# Patient Record
Sex: Female | Born: 1945 | Race: White | Hispanic: No | Marital: Married | Smoking: Never smoker
Health system: Southern US, Community
[De-identification: ages and names within clinical notes are randomized; demographics above are authoritative.]

## PROBLEM LIST (undated history)

## (undated) DIAGNOSIS — D649 Anemia, unspecified: Secondary | ICD-10-CM

## (undated) DIAGNOSIS — Z9889 Other specified postprocedural states: Secondary | ICD-10-CM

## (undated) DIAGNOSIS — E785 Hyperlipidemia, unspecified: Secondary | ICD-10-CM

## (undated) DIAGNOSIS — G4730 Sleep apnea, unspecified: Secondary | ICD-10-CM

## (undated) DIAGNOSIS — M1990 Unspecified osteoarthritis, unspecified site: Secondary | ICD-10-CM

## (undated) DIAGNOSIS — I10 Essential (primary) hypertension: Secondary | ICD-10-CM

## (undated) DIAGNOSIS — R42 Dizziness and giddiness: Secondary | ICD-10-CM

## (undated) DIAGNOSIS — R112 Nausea with vomiting, unspecified: Secondary | ICD-10-CM

## (undated) DIAGNOSIS — R519 Headache, unspecified: Secondary | ICD-10-CM

## (undated) DIAGNOSIS — J189 Pneumonia, unspecified organism: Secondary | ICD-10-CM

## (undated) DIAGNOSIS — E119 Type 2 diabetes mellitus without complications: Secondary | ICD-10-CM

## (undated) DIAGNOSIS — R0600 Dyspnea, unspecified: Secondary | ICD-10-CM

## (undated) DIAGNOSIS — R51 Headache: Secondary | ICD-10-CM

## (undated) HISTORY — PX: ESOPHAGOGASTRODUODENOSCOPY: SHX1529

## (undated) HISTORY — DX: Hyperlipidemia, unspecified: E78.5

## (undated) HISTORY — DX: Essential (primary) hypertension: I10

## (undated) HISTORY — PX: APPENDECTOMY: SHX54

## (undated) HISTORY — PX: GIVENS CAPSULE STUDY: SHX5432

## (undated) HISTORY — PX: COLONOSCOPY: SHX174

## (undated) HISTORY — DX: Dizziness and giddiness: R42

## (undated) HISTORY — PX: HEMORROIDECTOMY: SUR656

## (undated) HISTORY — PX: UMBILICAL HERNIA REPAIR: SHX196

## (undated) HISTORY — DX: Type 2 diabetes mellitus without complications: E11.9

## (undated) HISTORY — PX: ABDOMINAL HYSTERECTOMY: SHX81

---

## 2006-06-22 HISTORY — PX: CARPAL TUNNEL RELEASE: SHX101

## 2009-12-04 ENCOUNTER — Ambulatory Visit (HOSPITAL_COMMUNITY): Admission: RE | Admit: 2009-12-04 | Discharge: 2009-12-05 | Payer: Self-pay | Admitting: Neurosurgery

## 2010-09-07 LAB — GLUCOSE, CAPILLARY
Glucose-Capillary: 136 mg/dL — ABNORMAL HIGH (ref 70–99)
Glucose-Capillary: 142 mg/dL — ABNORMAL HIGH (ref 70–99)
Glucose-Capillary: 160 mg/dL — ABNORMAL HIGH (ref 70–99)
Glucose-Capillary: 203 mg/dL — ABNORMAL HIGH (ref 70–99)
Glucose-Capillary: 230 mg/dL — ABNORMAL HIGH (ref 70–99)

## 2010-09-07 LAB — POCT I-STAT 4, (NA,K, GLUC, HGB,HCT)
Glucose, Bld: 152 mg/dL — ABNORMAL HIGH (ref 70–99)
HCT: 32 % — ABNORMAL LOW (ref 36.0–46.0)
Hemoglobin: 10.9 g/dL — ABNORMAL LOW (ref 12.0–15.0)
Potassium: 2.9 mEq/L — ABNORMAL LOW (ref 3.5–5.1)
Sodium: 139 mEq/L (ref 135–145)

## 2010-09-08 LAB — BASIC METABOLIC PANEL
BUN: 11 mg/dL (ref 6–23)
CO2: 27 mEq/L (ref 19–32)
Calcium: 9.3 mg/dL (ref 8.4–10.5)
Chloride: 101 mEq/L (ref 96–112)
Creatinine, Ser: 1.02 mg/dL (ref 0.4–1.2)
GFR calc Af Amer: >60 mL/min (ref >60)
GFR calc non Af Amer: 55 mL/min — ABNORMAL LOW (ref >60)
Glucose, Bld: 227 mg/dL — ABNORMAL HIGH (ref 70–99)
Potassium: 3.9 mEq/L (ref 3.5–5.1)
Sodium: 137 mEq/L (ref 135–145)

## 2010-09-08 LAB — SURGICAL PCR SCREEN
MRSA, PCR: NEGATIVE
Staphylococcus aureus: NEGATIVE

## 2010-09-08 LAB — CBC
HCT: 39.3 % (ref 36.0–46.0)
Hemoglobin: 13.5 g/dL (ref 12.0–15.0)
MCHC: 34.3 g/dL (ref 30.0–36.0)
MCV: 90.1 fL (ref 78.0–100.0)
Platelets: 236 10*3/uL (ref 150–400)
RBC: 4.37 MIL/uL (ref 3.87–5.11)
RDW: 13.9 % (ref 11.5–15.5)
WBC: 11.9 10*3/uL — ABNORMAL HIGH (ref 4.0–10.5)

## 2010-09-08 LAB — TYPE AND SCREEN
ABO/RH(D): O POS
Antibody Screen: NEGATIVE

## 2010-09-08 LAB — ABO/RH: ABO/RH(D): O POS

## 2011-05-24 IMAGING — CR DG LUMBAR SPINE 2-3V
1 series · 1 of 1 positions shown · non-contrast
Comparison: None.

CLINICAL DATA: L4-5 decompression

LUMBAR SPINE - 2-3 VIEW

[view not recorded]
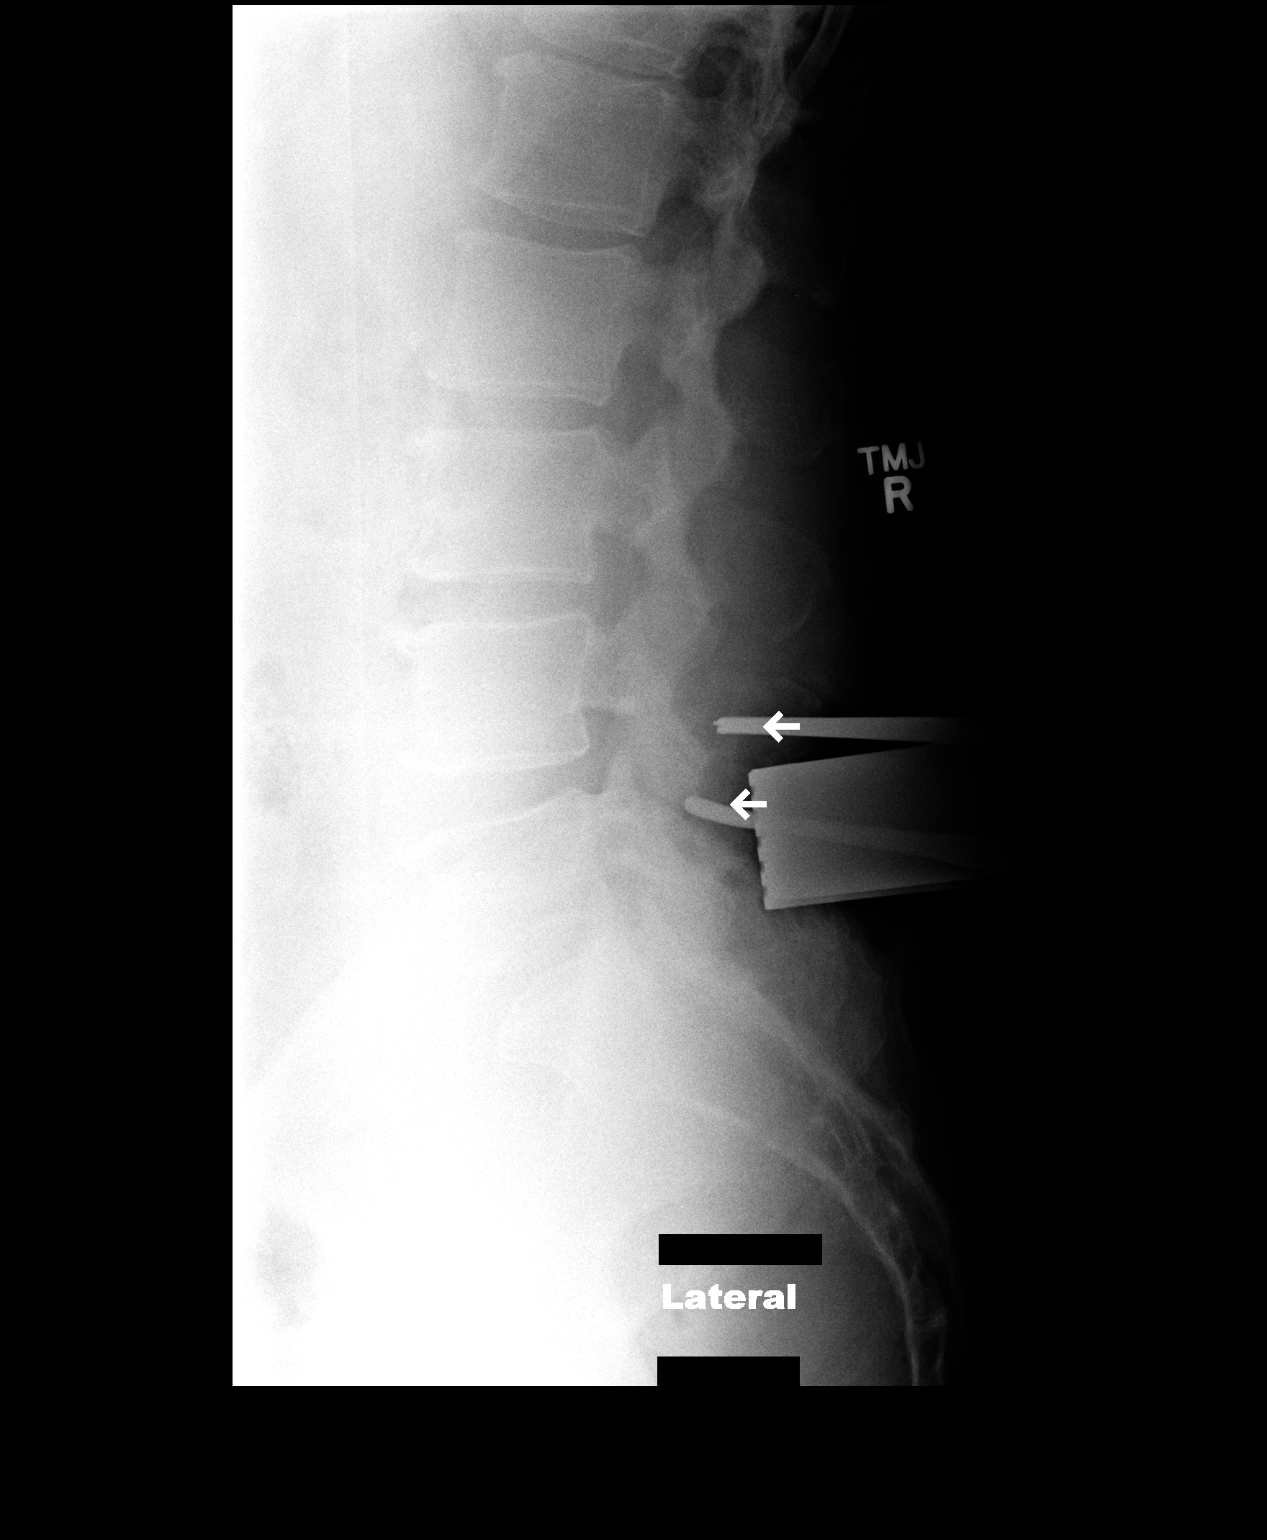

[1 of 1 positions shown; findings below may reference images not displayed]

FINDINGS: Two views of the lumbar spine are submitted.  The first
image is labeled [DATE] a.m.  A needle is directed at the L4-5 disc
space.  The second image is labeled [DATE] a.m.  There are two
surgical probes which are both directed at the L4-5 disc space.
Alignment is stable between the two images.
IMPRESSION: Localization of the L4-5 disc space.

## 2015-12-13 LAB — BASIC METABOLIC PANEL
Creatinine: 1.3 mg/dL — AB (ref <=1.1)
Potassium: 4.3 mmol/L (ref 3.4–5.3)
Sodium: 138 mmol/L (ref 137–147)

## 2015-12-13 LAB — HEMOGLOBIN A1C: HEMOGLOBIN A1C: 8.5

## 2015-12-13 LAB — LIPID PANEL
CHOLESTEROL: 119 mg/dL (ref 0–200)
HDL: 39 mg/dL (ref 35–70)
LDL CALC: 56 mg/dL
TRIGLYCERIDES: 121 mg/dL (ref 40–160)

## 2016-01-24 ENCOUNTER — Encounter: Payer: Self-pay | Admitting: "Endocrinology

## 2016-01-24 ENCOUNTER — Ambulatory Visit (INDEPENDENT_AMBULATORY_CARE_PROVIDER_SITE_OTHER): Payer: Medicare Other | Admitting: "Endocrinology

## 2016-01-24 VITALS — BP 124/66 | HR 80 | Resp 18 | Ht 65.0 in | Wt 228.0 lb

## 2016-01-24 DIAGNOSIS — E782 Mixed hyperlipidemia: Secondary | ICD-10-CM | POA: Insufficient documentation

## 2016-01-24 DIAGNOSIS — N183 Type 2 diabetes mellitus with diabetic chronic kidney disease: Secondary | ICD-10-CM | POA: Insufficient documentation

## 2016-01-24 DIAGNOSIS — Z794 Long term (current) use of insulin: Secondary | ICD-10-CM | POA: Diagnosis not present

## 2016-01-24 DIAGNOSIS — E1122 Type 2 diabetes mellitus with diabetic chronic kidney disease: Secondary | ICD-10-CM | POA: Diagnosis not present

## 2016-01-24 DIAGNOSIS — I10 Essential (primary) hypertension: Secondary | ICD-10-CM

## 2016-01-24 DIAGNOSIS — E1165 Type 2 diabetes mellitus with hyperglycemia: Secondary | ICD-10-CM

## 2016-01-24 DIAGNOSIS — IMO0002 Reserved for concepts with insufficient information to code with codable children: Secondary | ICD-10-CM

## 2016-01-24 DIAGNOSIS — E785 Hyperlipidemia, unspecified: Secondary | ICD-10-CM

## 2016-01-24 MED ORDER — SITAGLIPTIN PHOSPHATE 25 MG PO TABS: 25.0000 mg | ORAL_TABLET | Freq: Every day | ORAL | 3 refills | Status: DC

## 2016-01-24 NOTE — Progress Notes (Signed)
Subjective:    Patient ID: Tiffany Brock, female    DOB: 11-08-1945. Patient is being seen in consultation for management of diabetes requested by  Thea Alken  Past Medical History:  Diagnosis Date  . Diabetes mellitus, type II ([place])   . Vertigo    Past Surgical History:  Procedure Laterality Date  . ABDOMINAL HYSTERECTOMY    . APPENDECTOMY     Social History   Social History  . Marital status: Married    Spouse name: N/A  . Number of children: N/A  . Years of education: N/A   Social History Main Topics  . Smoking status: Never Smoker  . Smokeless tobacco: Never Used  . Alcohol use None  . Drug use: Unknown  . Sexual activity: Not Asked   Other Topics Concern  . None   Social History Narrative  . None   Outpatient Encounter Prescriptions as of 01/24/2016  Medication Sig  . B-D INS SYR ULTRAFINE 1CC/30G 30G X 1/2" 1 ML MISC daily. use as directed  . esomeprazole (NEXIUM) 40 MG capsule Take 40 mg by mouth daily.  Marland Kitchen KLOR-CON M20 20 MEQ tablet Take 20 mEq by mouth daily.  Marland Kitchen LANTUS 100 UNIT/ML injection Inject 60 Units into the skin at bedtime.  Marland Kitchen PARoxetine (PAXIL) 10 MG tablet Take 10 mg by mouth daily.  . Vitamin D, Ergocalciferol, (DRISDOL) 50000 units CAPS capsule Take 50,000 Units by mouth as directed.  . [DISCONTINUED] JANUMET XR [phone] MG TB24 Take by mouth daily.  . sitaGLIPtin (JANUVIA) 25 MG tablet Take 1 tablet (25 mg total) by mouth daily.   No facility-administered encounter medications on file as of 01/24/2016.    ALLERGIES: Allergies  Allergen Reactions  . Biaxin [Clarithromycin] Hives   VACCINATION STATUS:  There is no immunization history on file for this patient.  Diabetes  She presents for her initial diabetic visit. She has type 2 diabetes mellitus. Onset time: She was diagnosed at approximate age of 70 years. Her disease course has been worsening. There are no hypoglycemic associated symptoms. Pertinent negatives for  hypoglycemia include no confusion, headaches, pallor or seizures. Associated symptoms include blurred vision, fatigue, polydipsia and polyuria. Pertinent negatives for diabetes include no chest pain and no polyphagia. There are no hypoglycemic complications. Symptoms are worsening. Risk factors for coronary artery disease include diabetes mellitus, dyslipidemia, family history, hypertension, obesity and sedentary lifestyle. Current diabetic treatment includes insulin injections and oral agent (dual therapy). Her weight is stable. She is following a generally unhealthy diet. When asked about meal planning, she reported none. She has not had a previous visit with a dietitian. She never participates in exercise. Home blood sugar record trend: She did not bring any meter nor logs to review today. She admits that she does not monitor blood glucose regularly. Her most recent A1c was 8.5%. An ACE inhibitor/angiotensin II receptor blocker is being taken. She sees a podiatrist.Eye exam is current.  Hyperlipidemia  This is a chronic problem. The current episode started more than 1 year ago. The problem is controlled. Exacerbating diseases include diabetes and obesity. Pertinent negatives include no chest pain, myalgias or shortness of breath. Current antihyperlipidemic treatment includes statins. Risk factors for coronary artery disease include diabetes mellitus, dyslipidemia, family history, obesity, hypertension and a sedentary lifestyle.  Hypertension  This is a chronic problem. The current episode started more than 1 year ago. Associated symptoms include blurred vision. Pertinent negatives include no chest pain, headaches, palpitations or shortness  of breath. Risk factors for coronary artery disease include dyslipidemia, diabetes mellitus, family history, obesity and sedentary lifestyle. Past treatments include angiotensin blockers.       Review of Systems  Constitutional: Positive for fatigue. Negative for  activity change, chills, fever and unexpected weight change.  HENT: Negative for trouble swallowing and voice change.   Eyes: Positive for blurred vision. Negative for visual disturbance.  Respiratory: Negative for cough, shortness of breath and wheezing.   Cardiovascular: Negative for chest pain, palpitations and leg swelling.  Gastrointestinal: Negative for diarrhea, nausea and vomiting.  Endocrine: Positive for polydipsia and polyuria. Negative for cold intolerance, heat intolerance and polyphagia.  Genitourinary: Positive for frequency. Negative for dysuria and flank pain.  Musculoskeletal: Negative for arthralgias and myalgias.  Skin: Negative for color change, pallor, rash and wound.  Neurological: Negative for seizures and headaches.  Psychiatric/Behavioral: Negative for confusion and suicidal ideas.    Objective:    BP 124/66   Pulse 80   Resp 18   Ht 5' 5" (1.651 m)   Wt 228 lb (103.4 kg)   SpO2 98%   BMI 37.94 kg/m   Wt Readings from Last 3 Encounters:  01/24/16 228 lb (103.4 kg)    Physical Exam  Constitutional: She is oriented to person, place, and time. She appears well-developed.  Obese.  HENT:  Head: Normocephalic and atraumatic.  Eyes: EOM are normal.  Neck: Normal range of motion. Neck supple. No tracheal deviation present. No thyromegaly present.  Cardiovascular: Normal rate and regular rhythm.   Pulmonary/Chest: Effort normal and breath sounds normal.  Abdominal: Soft. Bowel sounds are normal. There is no tenderness. There is no guarding.  Musculoskeletal: Normal range of motion. She exhibits no edema.  Neurological: She is alert and oriented to person, place, and time. She has normal reflexes. No cranial nerve deficit. Coordination normal.  Skin: Skin is warm and dry. No rash noted. No erythema. No pallor.  Psychiatric: She has a normal mood and affect. Judgment normal.    CMP     Component Value Date/Time   NA 138 12/13/2015   K 4.3 12/13/2015   CL  101 11/27/2009 1305   CO2 27 11/27/2009 1305   GLUCOSE 152 (H) 12/04/2009 1027   BUN 11 11/27/2009 1305   CREATININE 1.3 (A) 12/13/2015   CREATININE 1.02 11/27/2009 1305   CALCIUM 9.3 11/27/2009 1305   GFRNONAA 55 (L) 11/27/2009 1305   GFRAA  11/27/2009 1305    >60        The eGFR has been calculated using the MDRD equation. This calculation has not been validated in all clinical situations. eGFR's persistently <60 mL/min signify possible Chronic Kidney Disease.   Diabetic Labs (most recent): Lab Results  Component Value Date   HGBA1C 8.5 12/13/2015    Lipid Panel     Component Value Date/Time   CHOL 119 12/13/2015   TRIG 121 12/13/2015   HDL 39 12/13/2015   LDLCALC 56 12/13/2015     Assessment & Plan:   1. Uncontrolled type 2 diabetes mellitus with stage 3 chronic kidney disease, with long-term current use of insulin ([place])  - Patient has currently uncontrolled symptomatic type 2 DM since  70 years of age,  with most recent A1c of 8.5 %. Recent labs reviewed.   Her diabetes is complicated by obesity , chronic kidney disease, and sedentary life and patient remains at a high risk for more acute and chronic complications of diabetes which include CAD, CVA, CKD,  retinopathy, and neuropathy. These are all discussed in detail with the patient.  - I have counseled the patient on diet management and weight loss, by adopting a carbohydrate restricted/protein rich diet.  - Suggestion is made for patient to avoid simple carbohydrates   from their diet including Cakes , Desserts, Ice Cream,  Soda (  diet and regular) , Sweet Tea , Candies,  Chips, Cookies, Artificial Sweeteners,   and "Sugar-free" Products . This will help patient to have stable blood glucose profile and potentially avoid unintended weight gain.  - I encouraged the patient to switch to  unprocessed or minimally processed complex starch and increased protein intake (animal or plant source), fruits, and  vegetables.  - Patient is advised to stick to a routine mealtimes to eat 3 meals  a day and avoid unnecessary snacks ( to snack only to correct hypoglycemia).  - The patient will be scheduled with Jearld Fenton, RDN, CDE for individualized DM education.  - I have approached patient with the following individualized plan to manage diabetes and patient agrees:   - I  will proceed to readjust her basal insulin Lantus to 60 units QHS, associated with strict monitoring of glucose  AC and HS. - Depending on her blood glucose for the next 7 days and her commitment, she may require 100 insulin with rapid acting kind.  -Patient is encouraged to call clinic for blood glucose levels less than 70 or above 300 mg /dl.  - I will discontinue Janumet due to stage 3 CK D with GFR of 42, but initiate Januvia 25 mg by mouth every morning. - If her renal function improves she will be reconsidered for low-dose metformin on subsequent visits.  - Patient will be considered for incretin therapy as appropriate next visit. - Patient specific target  A1c;  LDL, HDL, Triglycerides, and  Waist Circumference were discussed in detail.  2) BP/HTN: Controlled. Continue current medications including ARB. 3) Lipids/HPL:  Controlled, LDL 52, continue simvastatin 40 mg by mouth daily at bedtime. Side effects and precautions discussed with her. 4)  Weight/Diet: CDE Consult will be initiated , exercise, and detailed carbohydrates information provided.  5) Chronic Care/Health Maintenance:  -Patient is on ARB and Statin medications and encouraged to continue to follow up with Ophthalmology, Podiatrist at least yearly or according to recommendations, and advised to   stay away from smoking. I have recommended yearly flu vaccine and pneumonia vaccination at least every 5 years; moderate intensity exercise for up to 150 minutes weekly; and  sleep for at least 7 hours a day.  - 60 minutes of time was spent on the care of this patient  , 50% of which was applied for counseling on diabetes complications and their preventions.  - Patient to bring meter and  blood glucose logs during their next visit.   - I advised patient to maintain close follow up with Ephriam Jenkins E for primary care needs.  Follow up plan: - Return in about 1 week (around 01/31/2016) for follow up with meter and logs- no labs.  Glade Lloyd, MD Phone: [phone]  Fax: [phone]   01/24/2016, 2:57 PM

## 2016-01-31 ENCOUNTER — Ambulatory Visit (INDEPENDENT_AMBULATORY_CARE_PROVIDER_SITE_OTHER): Payer: Medicare Other | Admitting: "Endocrinology

## 2016-01-31 ENCOUNTER — Encounter: Payer: Self-pay | Admitting: "Endocrinology

## 2016-01-31 VITALS — BP 128/52 | HR 69 | Ht 65.0 in | Wt 232.0 lb

## 2016-01-31 DIAGNOSIS — E785 Hyperlipidemia, unspecified: Secondary | ICD-10-CM | POA: Diagnosis not present

## 2016-01-31 DIAGNOSIS — Z794 Long term (current) use of insulin: Secondary | ICD-10-CM | POA: Diagnosis not present

## 2016-01-31 DIAGNOSIS — E1122 Type 2 diabetes mellitus with diabetic chronic kidney disease: Secondary | ICD-10-CM

## 2016-01-31 DIAGNOSIS — I10 Essential (primary) hypertension: Secondary | ICD-10-CM | POA: Diagnosis not present

## 2016-01-31 DIAGNOSIS — N183 Chronic kidney disease, stage 3 (moderate): Secondary | ICD-10-CM | POA: Diagnosis not present

## 2016-01-31 DIAGNOSIS — IMO0002 Reserved for concepts with insufficient information to code with codable children: Secondary | ICD-10-CM

## 2016-01-31 DIAGNOSIS — E1165 Type 2 diabetes mellitus with hyperglycemia: Secondary | ICD-10-CM | POA: Diagnosis not present

## 2016-01-31 MED ORDER — INSULIN ASPART 100 UNIT/ML FLEXPEN: 10.0000 [IU] | PEN_INJECTOR | Freq: Three times a day (TID) | SUBCUTANEOUS | 1 refills | Status: DC

## 2016-01-31 NOTE — Progress Notes (Signed)
Subjective:    Patient ID: Tiffany Brock, female    DOB: 10/10/1945. Patient is being seen in f/u for management of diabetes requested by  Thea Alken  Past Medical History:  Diagnosis Date  . Diabetes mellitus, type II ([place])   . Vertigo    Past Surgical History:  Procedure Laterality Date  . ABDOMINAL HYSTERECTOMY    . APPENDECTOMY     Social History   Social History  . Marital status: Married    Spouse name: N/A  . Number of children: N/A  . Years of education: N/A   Social History Main Topics  . Smoking status: Never Smoker  . Smokeless tobacco: Never Used  . Alcohol use None  . Drug use: Unknown  . Sexual activity: Not Asked   Other Topics Concern  . None   Social History Narrative  . None   Outpatient Encounter Prescriptions as of 01/31/2016  Medication Sig  . B-D INS SYR ULTRAFINE 1CC/30G 30G X 1/2" 1 ML MISC daily. use as directed  . esomeprazole (NEXIUM) 40 MG capsule Take 40 mg by mouth daily.  . insulin aspart (NOVOLOG) 100 UNIT/ML FlexPen Inject 10-16 Units into the skin 3 (three) times daily with meals.  Marland Kitchen KLOR-CON M20 20 MEQ tablet Take 20 mEq by mouth daily.  Marland Kitchen LANTUS 100 UNIT/ML injection Inject 60 Units into the skin at bedtime.  Marland Kitchen losartan-hydrochlorothiazide (HYZAAR) 50-12.5 MG tablet Take 1 tablet by mouth daily.  Marland Kitchen PARoxetine (PAXIL) 10 MG tablet Take 10 mg by mouth daily.  . simvastatin (ZOCOR) 40 MG tablet Take 40 mg by mouth daily.  . sitaGLIPtin (JANUVIA) 25 MG tablet Take 1 tablet (25 mg total) by mouth daily.  . Vitamin D, Ergocalciferol, (DRISDOL) 50000 units CAPS capsule Take 50,000 Units by mouth as directed.   No facility-administered encounter medications on file as of 01/31/2016.    ALLERGIES: Allergies  Allergen Reactions  . Biaxin [Clarithromycin] Hives   VACCINATION STATUS:  There is no immunization history on file for this patient.  Diabetes  She presents for her follow-up diabetic visit. She has type 2  diabetes mellitus. Onset time: She was diagnosed at approximate age of 27 years. Her disease course has been worsening. There are no hypoglycemic associated symptoms. Pertinent negatives for hypoglycemia include no confusion, headaches, pallor or seizures. Associated symptoms include blurred vision, fatigue, polydipsia and polyuria. Pertinent negatives for diabetes include no chest pain and no polyphagia. There are no hypoglycemic complications. Symptoms are worsening. Risk factors for coronary artery disease include diabetes mellitus, dyslipidemia, family history, hypertension, obesity and sedentary lifestyle. Current diabetic treatment includes insulin injections and oral agent (dual therapy). Her weight is stable. She is following a generally unhealthy diet. When asked about meal planning, she reported none. She has not had a previous visit with a dietitian. She never participates in exercise. Home blood sugar record trend: She did bring her meter and logs , showing persistently above target blood glucose profile. Her most recent A1c was 8.5%. Her breakfast blood glucose range is generally >200 mg/dl. Her lunch blood glucose range is generally >200 mg/dl. Her dinner blood glucose range is generally >200 mg/dl. Her overall blood glucose range is >200 mg/dl. An ACE inhibitor/angiotensin II receptor blocker is being taken. She sees a podiatrist.Eye exam is current.  Hyperlipidemia  This is a chronic problem. The current episode started more than 1 year ago. The problem is controlled. Exacerbating diseases include diabetes and obesity. Pertinent negatives include  no chest pain, myalgias or shortness of breath. Current antihyperlipidemic treatment includes statins. Risk factors for coronary artery disease include diabetes mellitus, dyslipidemia, family history, obesity, hypertension and a sedentary lifestyle.  Hypertension  This is a chronic problem. The current episode started more than 1 year ago. Associated  symptoms include blurred vision. Pertinent negatives include no chest pain, headaches, palpitations or shortness of breath. Risk factors for coronary artery disease include dyslipidemia, diabetes mellitus, family history, obesity and sedentary lifestyle. Past treatments include angiotensin blockers.       Review of Systems  Constitutional: Positive for fatigue. Negative for activity change, chills, fever and unexpected weight change.  HENT: Negative for trouble swallowing and voice change.   Eyes: Positive for blurred vision. Negative for visual disturbance.  Respiratory: Negative for cough, shortness of breath and wheezing.   Cardiovascular: Negative for chest pain, palpitations and leg swelling.  Gastrointestinal: Negative for diarrhea, nausea and vomiting.  Endocrine: Positive for polydipsia and polyuria. Negative for cold intolerance, heat intolerance and polyphagia.  Genitourinary: Positive for frequency. Negative for dysuria and flank pain.  Musculoskeletal: Negative for arthralgias and myalgias.  Skin: Negative for color change, pallor, rash and wound.  Neurological: Negative for seizures and headaches.  Psychiatric/Behavioral: Negative for confusion and suicidal ideas.    Objective:    BP (!) 128/52   Pulse 69   Ht '5\' 5"'  (1.651 m)   Wt 232 lb (105.2 kg)   BMI 38.61 kg/m   Wt Readings from Last 3 Encounters:  01/31/16 232 lb (105.2 kg)  01/24/16 228 lb (103.4 kg)    Physical Exam  Constitutional: She is oriented to person, place, and time. She appears well-developed.  Obese.  HENT:  Head: Normocephalic and atraumatic.  Eyes: EOM are normal.  Neck: Normal range of motion. Neck supple. No tracheal deviation present. No thyromegaly present.  Cardiovascular: Normal rate and regular rhythm.   Pulmonary/Chest: Effort normal and breath sounds normal.  Abdominal: Soft. Bowel sounds are normal. There is no tenderness. There is no guarding.  Musculoskeletal: Normal range of  motion. She exhibits no edema.  Neurological: She is alert and oriented to person, place, and time. She has normal reflexes. No cranial nerve deficit. Coordination normal.  Skin: Skin is warm and dry. No rash noted. No erythema. No pallor.  Psychiatric: She has a normal mood and affect. Judgment normal.    CMP     Component Value Date/Time   NA 138 12/13/2015   K 4.3 12/13/2015   CL 101 11/27/2009 1305   CO2 27 11/27/2009 1305   GLUCOSE 152 (H) 12/04/2009 1027   BUN 11 11/27/2009 1305   CREATININE 1.3 (A) 12/13/2015   CREATININE 1.02 11/27/2009 1305   CALCIUM 9.3 11/27/2009 1305   GFRNONAA 55 (L) 11/27/2009 1305   GFRAA  11/27/2009 1305    >60        The eGFR has been calculated using the MDRD equation. This calculation has not been validated in all clinical situations. eGFR's persistently <60 mL/min signify possible Chronic Kidney Disease.   Diabetic Labs (most recent): Lab Results  Component Value Date   HGBA1C 8.5 12/13/2015    Lipid Panel     Component Value Date/Time   CHOL 119 12/13/2015   TRIG 121 12/13/2015   HDL 39 12/13/2015   LDLCALC 56 12/13/2015     Assessment & Plan:   1. Uncontrolled type 2 diabetes mellitus with stage 3 chronic kidney disease, with long-term current use of insulin ([place])  -  Patient has currently uncontrolled symptomatic type 2 DM since  70 years of age,  with most recent A1c of 8.5 %. Recent labs reviewed.   Her diabetes is complicated by obesity , chronic kidney disease, and sedentary life and patient remains at a high risk for more acute and chronic complications of diabetes which include CAD, CVA, CKD, retinopathy, and neuropathy. These are all discussed in detail with the patient.  - I have counseled the patient on diet management and weight loss, by adopting a carbohydrate restricted/protein rich diet.  - Suggestion is made for patient to avoid simple carbohydrates   from their diet including Cakes , Desserts, Ice Cream,   Soda (  diet and regular) , Sweet Tea , Candies,  Chips, Cookies, Artificial Sweeteners,   and "Sugar-free" Products . This will help patient to have stable blood glucose profile and potentially avoid unintended weight gain.  - I encouraged the patient to switch to  unprocessed or minimally processed complex starch and increased protein intake (animal or plant source), fruits, and vegetables.  - Patient is advised to stick to a routine mealtimes to eat 3 meals  a day and avoid unnecessary snacks ( to snack only to correct hypoglycemia).  - The patient will be scheduled with Jearld Fenton, RDN, CDE for individualized DM education.  - I have approached patient with the following individualized plan to manage diabetes and patient agrees:   - Based on her blood glucose profile and glycemic burden she will need basal/bolus insulin. She agrees with plan. - I  will proceed with her basal insulin Lantus to 60 units QHS,  initiate NovoLog 10 units 3 times a day before meals for pre-meal blood glucose above 90 mg/dL associated with strict monitoring of glucose  AC and HS.  -Patient is encouraged to call clinic for blood glucose levels less than 70 or above 300 mg /dl.  - I will discontinue Janumet due to stage 3 CK D with GFR of 42, but initiate Januvia 25 mg by mouth every morning. - If her renal function improves she will be reconsidered for low-dose metformin on subsequent visits.  - Patient will be considered for incretin therapy as appropriate next visit. - Patient specific target  A1c;  LDL, HDL, Triglycerides, and  Waist Circumference were discussed in detail.  2) BP/HTN: Controlled. Continue current medications including ARB. 3) Lipids/HPL:  Controlled, LDL 52, continue simvastatin 40 mg by mouth daily at bedtime. Side effects and precautions discussed with her. 4)  Weight/Diet: CDE Consult will be initiated , exercise, and detailed carbohydrates information provided.  5) Chronic Care/Health  Maintenance:  -Patient is on ARB and Statin medications and encouraged to continue to follow up with Ophthalmology, Podiatrist at least yearly or according to recommendations, and advised to   stay away from smoking. I have recommended yearly flu vaccine and pneumonia vaccination at least every 5 years; moderate intensity exercise for up to 150 minutes weekly; and  sleep for at least 7 hours a day.  - 25 minutes of time was spent on the care of this patient , 50% of which was applied for counseling on diabetes complications and their preventions.  - Patient to bring meter and  blood glucose logs during their next visit.   - I advised patient to maintain close follow up with Ephriam Jenkins E for primary care needs.  Follow up plan: - Return in about 7 weeks (around 03/20/2016) for follow up with pre-visit labs, meter, and logs.  Glade Lloyd, MD Phone: [phone]  Fax: [phone]   01/31/2016, 10:25 AM

## 2016-01-31 NOTE — Patient Instructions (Signed)

## 2016-02-14 ENCOUNTER — Encounter: Payer: Medicare Other | Attending: "Endocrinology | Admitting: Nutrition

## 2016-02-14 VITALS — Ht 65.0 in | Wt 234.0 lb

## 2016-02-14 DIAGNOSIS — IMO0002 Reserved for concepts with insufficient information to code with codable children: Secondary | ICD-10-CM

## 2016-02-14 DIAGNOSIS — Z713 Dietary counseling and surveillance: Secondary | ICD-10-CM | POA: Insufficient documentation

## 2016-02-14 DIAGNOSIS — E119 Type 2 diabetes mellitus without complications: Secondary | ICD-10-CM | POA: Insufficient documentation

## 2016-02-14 DIAGNOSIS — E118 Type 2 diabetes mellitus with unspecified complications: Secondary | ICD-10-CM

## 2016-02-14 DIAGNOSIS — Z794 Long term (current) use of insulin: Secondary | ICD-10-CM

## 2016-02-14 DIAGNOSIS — E1165 Type 2 diabetes mellitus with hyperglycemia: Secondary | ICD-10-CM

## 2016-02-14 NOTE — Patient Instructions (Signed)
Goals Plan:  Aim for 2-3 Carb Choices per meal (30-45 grams) +/- 1 either way  No snacks between meals unless vegetables.  Include protein in moderation with your meals and snacks Consider reading food labels for Total Carbohydrate and Fat Grams of foods Consider  increasing your activity level by 30-60 minutes daily as tolerated Consider checking BG at alternate times per day as directed by MD  Consider taking medication of insulin as directed by MD Cut out [place], fried foods and processed foods Get A1C t0 7% in 3-6 months Lose 1-2 lbs per week.

## 2016-02-14 NOTE — Progress Notes (Signed)
  Medical Nutrition Therapy:  Appt start time: 0930 end time:  1030.   Assessment:  Primary concerns today: DIabetes Type 2 DM. She does most of the cooking and shopping. MOst foods are fried. Meats eaten per day- 3. Snacks 1-2 per day. Occasionally drinks of sundrop or water and unsweet tea. Physical activity: Started walking 30 minutes just started.   Lab Results  Component Value Date   HGBA1C 8.5 12/13/2015     Preferred Learning Style:  No preference indicated   Learning Readiness:  Ready  Change in progress   MEDICATIONS: see list   DIETARY INTAKE:   24-hr recall:  B ( AM): Bowl Cherrios with 2% milk,   Snk ( AM): none  L ( PM): Bologna sandwich on wheat with mayo, water Snk ( PM): 2 ginger snaps, water D ( PM): ham and cheese sandwich on ww bread with tomato and mustard, water and macaroni salad 3/4-1 cupx water Snk ( PM): 3 grapes Beverages: water, occasional sundrop,   Usual physical activity: walking.  Estimated energy needs: 1500 calories 170 g carbohydrates 112 g protein 42 g fat  Progress Towards Goal(s):  In progress.   Nutritional Diagnosis:  NB-1.1 Food and nutrition-related knowledge deficit As related to Diabetes.  As evidenced by A1C 8.5%.    Intervention: Nutrition and Diabetes education provided on My Plate, CHO counting, meal planning, portion sizes, timing of meals, avoiding snacks between meals unless having a low blood sugar, target ranges for A1C and blood sugars, signs/symptoms and treatment of hyper/hypoglycemia, monitoring blood sugars, taking medications as prescribed, benefits of exercising 30 minutes per day and prevention of complications of DM.   Goals Plan:  Aim for 2-3 Carb Choices per meal (30-45 grams) +/- 1 either way  No snacks between meals unless vegetables.  Include protein in moderation with your meals and snacks Consider reading food labels for Total Carbohydrate and Fat Grams of foods Consider  increasing your  activity level by 30-60 minutes daily as tolerated Consider checking BG at alternate times per day as directed by MD  Consider taking medication of insulin as directed by MD Cut out Sundrop, fried foods and processed foods Get A1C t0 7% in 3-6 months Lose 1-2 lbs per week.   Teaching Method Utilized:  Visual Auditory Hands on  Handouts given during visit include:  The Plate Method   Meal Plan Card  Diabetes Instructions.   Barriers to learning/adherence to lifestyle change:  None  Demonstrated degree of understanding via:  Teach Back   Monitoring/Evaluation:  Dietary intake, exercise, meal planning, SBG, and body weight in 1 month(s).

## 2016-03-18 ENCOUNTER — Encounter: Payer: Medicare Other | Attending: "Endocrinology | Admitting: Nutrition

## 2016-03-18 ENCOUNTER — Encounter: Payer: Self-pay | Admitting: Nutrition

## 2016-03-18 VITALS — Ht 65.5 in | Wt 229.0 lb

## 2016-03-18 DIAGNOSIS — E119 Type 2 diabetes mellitus without complications: Secondary | ICD-10-CM | POA: Diagnosis not present

## 2016-03-18 DIAGNOSIS — E118 Type 2 diabetes mellitus with unspecified complications: Secondary | ICD-10-CM

## 2016-03-18 DIAGNOSIS — Z713 Dietary counseling and surveillance: Secondary | ICD-10-CM | POA: Insufficient documentation

## 2016-03-18 DIAGNOSIS — IMO0002 Reserved for concepts with insufficient information to code with codable children: Secondary | ICD-10-CM

## 2016-03-18 DIAGNOSIS — E669 Obesity, unspecified: Secondary | ICD-10-CM

## 2016-03-18 DIAGNOSIS — Z794 Long term (current) use of insulin: Secondary | ICD-10-CM

## 2016-03-18 DIAGNOSIS — E1165 Type 2 diabetes mellitus with hyperglycemia: Secondary | ICD-10-CM

## 2016-03-18 NOTE — Progress Notes (Addendum)
  Medical Nutrition Therapy:  Appt start time: 0930 end time:  1000  Assessment:  Primary concerns today: DIabetes Type 2 DM. Her back went out last week and got a cortizone shot for pain and nausea. She is frustrated with her BS numbers. She has worked on cutting out sweets. Drinking a lot more water. Eating more low vegetables- broccoli, carrots and celery.    Walking has been on pause due to her back problems.  She got a steroid shot a few days ago and BS went to 500 mg/dl.  She is taking her insulin as prescribed but not eating enough low carb vegetables and eating fruit and snacks between meals.  Still drinking soda occasionally. Needs more consistency with diet.    To get her labs done tomorrow and sees Dr. Fransico HimNida on Monday.  Lantus 60 units daily and 10 units with meals plus sliding scale and Januvia.  BS log brought in. BS are high >200's consistently.   Lab Results  Component Value Date   HGBA1C 8.5 12/13/2015    Preferred Learning Style:  No preference indicated   Learning Readiness:  Ready  Change in progress   MEDICATIONS: see list   DIETARY INTAKE:   24-hr recall:  B ( AM):Oatmeal;  Flavored or plain 1 pkt,   Snk ( AM): none  L ( PM): Cheese sandwich on ww bread, water Snk ( PM): handful of pork rids D ( PM): Bologna burger, water Snk ( PM): 10 Beverages: water, Usual physical activity: walking.  Estimated energy needs: 1500 calories 170 g carbohydrates 112 g protein 42 g fat  Progress Towards Goal(s):  In progress.   Nutritional Diagnosis:  NB-1.1 Food and nutrition-related knowledge deficit As related to Diabetes.  As evidenced by A1C 8.5%.    Intervention: Nutrition and Diabetes education provided on My Plate, CHO counting, meal planning, portion sizes, timing of meals, avoiding snacks between meals unless having a low blood sugar, target ranges for A1C and blood sugars, signs/symptoms and treatment of hyper/hypoglycemia, monitoring blood sugars, taking  medications as prescribed, benefits of exercising 30 minutes per day and prevention of complications of DM.   Goals  1. Increase low carb vegetables to 2 for lunch and dinner 2. Use sliding scale and document amount of insulin taken on sheets 3. Call if BS over 300 2-3 times in a row 4. Continue drinking water 5. Cut out all sodas and bologna and processed meats. 6. Eat fruit with meal and not as a snack. 7. Exercise as tolerated. Get A1C down to 7% in 3-6 months.  Teaching Method Utilized:  Visual Auditory Hands on  Handouts given during visit include:  The Plate Method   Meal Plan Card  Diabetes Instructions.   Barriers to learning/adherence to lifestyle change:  None  Demonstrated degree of understanding via:  Teach Back   Monitoring/Evaluation:  Dietary intake, exercise, meal planning, SBG, and body weight in 1 month(s).

## 2016-03-18 NOTE — Patient Instructions (Addendum)
Goals 1. Increase low carb vegetables to 2 for lunch and dinner 2. Use sliding scale and document amount of insulin taken on sheets 3. Call if BS over 300 2-3 times in a row 4. Continue drinking water 5. Cut out all sodas and bologna and processed meats. 6. Eat fruit with meal and not as a snack. 7. Exercise as tolerated. Get A1C down to 7% in 3-6 months.

## 2016-03-19 ENCOUNTER — Encounter: Payer: Self-pay | Admitting: "Endocrinology

## 2016-03-19 LAB — HEMOGLOBIN A1C: Hemoglobin A1C: 9.1

## 2016-03-23 ENCOUNTER — Ambulatory Visit (INDEPENDENT_AMBULATORY_CARE_PROVIDER_SITE_OTHER): Payer: Medicare Other | Admitting: "Endocrinology

## 2016-03-23 ENCOUNTER — Encounter: Payer: Self-pay | Admitting: "Endocrinology

## 2016-03-23 VITALS — BP 122/71 | HR 71 | Ht 65.0 in | Wt 226.0 lb

## 2016-03-23 DIAGNOSIS — E1122 Type 2 diabetes mellitus with diabetic chronic kidney disease: Secondary | ICD-10-CM | POA: Diagnosis not present

## 2016-03-23 DIAGNOSIS — I10 Essential (primary) hypertension: Secondary | ICD-10-CM | POA: Diagnosis not present

## 2016-03-23 DIAGNOSIS — E782 Mixed hyperlipidemia: Secondary | ICD-10-CM | POA: Diagnosis not present

## 2016-03-23 DIAGNOSIS — N183 Chronic kidney disease, stage 3 (moderate): Secondary | ICD-10-CM

## 2016-03-23 DIAGNOSIS — E1165 Type 2 diabetes mellitus with hyperglycemia: Secondary | ICD-10-CM | POA: Diagnosis not present

## 2016-03-23 DIAGNOSIS — Z794 Long term (current) use of insulin: Secondary | ICD-10-CM | POA: Diagnosis not present

## 2016-03-23 DIAGNOSIS — IMO0002 Reserved for concepts with insufficient information to code with codable children: Secondary | ICD-10-CM

## 2016-03-23 MED ORDER — INSULIN ASPART 100 UNIT/ML FLEXPEN: 15.0000 [IU] | PEN_INJECTOR | Freq: Three times a day (TID) | SUBCUTANEOUS | 1 refills | Status: DC

## 2016-03-23 NOTE — Patient Instructions (Signed)

## 2016-03-23 NOTE — Progress Notes (Signed)
Subjective:    Patient ID: Tiffany Brock, female    DOB: [birth year]. Patient is being seen in f/u for management of diabetes requested by  Thea Alken  Past Medical History:  Diagnosis Date  . Diabetes mellitus, type II ([place])   . Vertigo    Past Surgical History:  Procedure Laterality Date  . ABDOMINAL HYSTERECTOMY    . APPENDECTOMY     Social History   Social History  . Marital status: Married    Spouse name: N/A  . Number of children: N/A  . Years of education: N/A   Social History Main Topics  . Smoking status: Never Smoker  . Smokeless tobacco: Never Used  . Alcohol use None  . Drug use: Unknown  . Sexual activity: Not Asked   Other Topics Concern  . None   Social History Narrative  . None   Outpatient Encounter Prescriptions as of 03/23/2016  Medication Sig  . B-D INS SYR ULTRAFINE 1CC/30G 30G X 1/2" 1 ML MISC daily. use as directed  . esomeprazole (NEXIUM) 40 MG capsule Take 40 mg by mouth daily.  . insulin aspart (NOVOLOG) 100 UNIT/ML FlexPen Inject 15-21 Units into the skin 3 (three) times daily with meals.  Marland Kitchen KLOR-CON M20 20 MEQ tablet Take 20 mEq by mouth daily.  Marland Kitchen LANTUS 100 UNIT/ML injection Inject 70 Units into the skin at bedtime.  Marland Kitchen losartan-hydrochlorothiazide (HYZAAR) 50-12.5 MG tablet Take 1 tablet by mouth daily.  Marland Kitchen PARoxetine (PAXIL) 10 MG tablet Take 10 mg by mouth daily.  . simvastatin (ZOCOR) 40 MG tablet Take 40 mg by mouth daily.  . sitaGLIPtin (JANUVIA) 25 MG tablet Take 1 tablet (25 mg total) by mouth daily.  . Vitamin D, Ergocalciferol, (DRISDOL) 50000 units CAPS capsule Take 50,000 Units by mouth as directed.  . [DISCONTINUED] insulin aspart (NOVOLOG) 100 UNIT/ML FlexPen Inject 10-16 Units into the skin 3 (three) times daily with meals.   No facility-administered encounter medications on file as of 03/23/2016.    ALLERGIES: Allergies  Allergen Reactions  . Biaxin [Clarithromycin] Hives   VACCINATION STATUS:  There is  no immunization history on file for this patient.  Diabetes  She presents for her follow-up diabetic visit. She has type 2 diabetes mellitus. Onset time: She was diagnosed at approximate age of 70 years. Her disease course has been worsening. There are no hypoglycemic associated symptoms. Pertinent negatives for hypoglycemia include no confusion, headaches, pallor or seizures. Associated symptoms include blurred vision, fatigue, polydipsia and polyuria. Pertinent negatives for diabetes include no chest pain and no polyphagia. There are no hypoglycemic complications. Symptoms are worsening. Risk factors for coronary artery disease include diabetes mellitus, dyslipidemia, family history, hypertension, obesity and sedentary lifestyle. Current diabetic treatment includes insulin injections and oral agent (dual therapy). Her weight is stable. She is following a generally unhealthy diet. When asked about meal planning, she reported none. She has not had a previous visit with a dietitian. She never participates in exercise. Her breakfast blood glucose range is generally >200 mg/dl. Her lunch blood glucose range is generally >200 mg/dl. Her dinner blood glucose range is generally >200 mg/dl. Her overall blood glucose range is >200 mg/dl. An ACE inhibitor/angiotensin II receptor blocker is being taken. She sees a podiatrist.Eye exam is current.  Hyperlipidemia  This is a chronic problem. The current episode started more than 1 year ago. The problem is controlled. Exacerbating diseases include diabetes and obesity. Pertinent negatives include no chest pain, myalgias or  shortness of breath. Current antihyperlipidemic treatment includes statins. Risk factors for coronary artery disease include diabetes mellitus, dyslipidemia, family history, obesity, hypertension and a sedentary lifestyle.  Hypertension  This is a chronic problem. The current episode started more than 1 year ago. Associated symptoms include blurred  vision. Pertinent negatives include no chest pain, headaches, palpitations or shortness of breath. Risk factors for coronary artery disease include dyslipidemia, diabetes mellitus, family history, obesity and sedentary lifestyle. Past treatments include angiotensin blockers.       Review of Systems  Constitutional: Positive for fatigue. Negative for activity change, chills, fever and unexpected weight change.  HENT: Negative for trouble swallowing and voice change.   Eyes: Positive for blurred vision. Negative for visual disturbance.  Respiratory: Negative for cough, shortness of breath and wheezing.   Cardiovascular: Negative for chest pain, palpitations and leg swelling.  Gastrointestinal: Negative for diarrhea, nausea and vomiting.  Endocrine: Positive for polydipsia and polyuria. Negative for cold intolerance, heat intolerance and polyphagia.  Genitourinary: Positive for frequency. Negative for dysuria and flank pain.  Musculoskeletal: Negative for arthralgias and myalgias.  Skin: Negative for color change, pallor, rash and wound.  Neurological: Negative for seizures and headaches.  Psychiatric/Behavioral: Negative for confusion and suicidal ideas.    Objective:    BP 122/71   Pulse 71   Ht '5\' 5"'  (1.651 m)   Wt 226 lb (102.5 kg)   BMI 37.61 kg/m   Wt Readings from Last 3 Encounters:  03/23/16 226 lb (102.5 kg)  03/18/16 229 lb (103.9 kg)  02/14/16 234 lb (106.1 kg)    Physical Exam  Constitutional: She is oriented to person, place, and time. She appears well-developed.  Obese.  HENT:  Head: Normocephalic and atraumatic.  Eyes: EOM are normal.  Neck: Normal range of motion. Neck supple. No tracheal deviation present. No thyromegaly present.  Cardiovascular: Normal rate and regular rhythm.   Pulmonary/Chest: Effort normal and breath sounds normal.  Abdominal: Soft. Bowel sounds are normal. There is no tenderness. There is no guarding.  Musculoskeletal: Normal range of  motion. She exhibits no edema.  Neurological: She is alert and oriented to person, place, and time. She has normal reflexes. No cranial nerve deficit. Coordination normal.  Skin: Skin is warm and dry. No rash noted. No erythema. No pallor.  Psychiatric: She has a normal mood and affect. Judgment normal.    CMP     Component Value Date/Time   NA 138 12/13/2015   K 4.3 12/13/2015   CL 101 11/27/2009 1305   CO2 27 11/27/2009 1305   GLUCOSE 152 (H) 12/04/2009 1027   BUN 11 11/27/2009 1305   CREATININE 1.3 (A) 12/13/2015   CREATININE 1.02 11/27/2009 1305   CALCIUM 9.3 11/27/2009 1305   GFRNONAA 55 (L) 11/27/2009 1305   GFRAA  11/27/2009 1305    >60        The eGFR has been calculated using the MDRD equation. This calculation has not been validated in all clinical situations. eGFR's persistently <60 mL/min signify possible Chronic Kidney Disease.   Diabetic Labs (most recent): Lab Results  Component Value Date   HGBA1C 9.1 03/19/2016   HGBA1C 8.5 12/13/2015    Lipid Panel     Component Value Date/Time   CHOL 119 12/13/2015   TRIG 121 12/13/2015   HDL 39 12/13/2015   LDLCALC 56 12/13/2015     Assessment & Plan:   1. Uncontrolled type 2 diabetes mellitus with stage 3 chronic kidney disease, with long-term  current use of insulin ([place])  - Patient has currently uncontrolled symptomatic type 2 DM since  70 years of age,  with most recent A1c of  9.1% increasing from 8.5 %. Recent labs reviewed.   Her diabetes is complicated by obesity , chronic kidney disease, and sedentary life and patient remains at a high risk for more acute and chronic complications of diabetes which include CAD, CVA, CKD, retinopathy, and neuropathy. These are all discussed in detail with the patient.  - I have counseled the patient on diet management and weight loss, by adopting a carbohydrate restricted/protein rich diet.  - Suggestion is made for patient to avoid simple carbohydrates   from their  diet including Cakes , Desserts, Ice Cream,  Soda (  diet and regular) , Sweet Tea , Candies,  Chips, Cookies, Artificial Sweeteners,   and "Sugar-free" Products . This will help patient to have stable blood glucose profile and potentially avoid unintended weight gain.  - I encouraged the patient to switch to  unprocessed or minimally processed complex starch and increased protein intake (animal or plant source), fruits, and vegetables.  - Patient is advised to stick to a routine mealtimes to eat 3 meals  a day and avoid unnecessary snacks ( to snack only to correct hypoglycemia).  - The patient will be scheduled with Jearld Fenton, RDN, CDE for individualized DM education.  - I have approached patient with the following individualized plan to manage diabetes and patient agrees:   - Based on her blood glucose profile and glycemic burden she will continue to need basal/bolus insulin. She agrees with plan. - I  will increase Lantus to 70 units QHS,  Increase NovoLog to 15 units 3 times a day before meals for pre-meal blood glucose above 90 mg/dL associated with strict monitoring of glucose  AC and HS.  -Patient is encouraged to call clinic for blood glucose levels less than 70 or above 300 mg /dl.  - I will continue  Januvia 25 mg by mouth every morning. - If her renal function improves she will be reconsidered for low-dose metformin on subsequent visits.  - Patient will be considered for incretin therapy as appropriate next visit. - Patient specific target  A1c;  LDL, HDL, Triglycerides, and  Waist Circumference were discussed in detail.  2) BP/HTN: Controlled. Continue current medications including ARB. 3) Lipids/HPL:  Controlled, LDL 52, continue simvastatin 40 mg by mouth daily at bedtime. Side effects and precautions discussed with her. 4)  Weight/Diet: CDE Consult will be initiated , exercise, and detailed carbohydrates information provided.  5) Chronic Care/Health  Maintenance:  -Patient is on ARB and Statin medications and encouraged to continue to follow up with Ophthalmology, Podiatrist at least yearly or according to recommendations, and advised to   stay away from smoking. I have recommended yearly flu vaccine and pneumonia vaccination at least every 5 years; moderate intensity exercise for up to 150 minutes weekly; and  sleep for at least 7 hours a day.  - 25 minutes of time was spent on the care of this patient , 50% of which was applied for counseling on diabetes complications and their preventions.  - Patient to bring meter and  blood glucose logs during their next visit.   - I advised patient to maintain close follow up with Ephriam Jenkins E for primary care needs.  Follow up plan: - Return in about 3 months (around 06/23/2016) for meter, and logs.  Glade Lloyd, MD Phone: [phone]  Fax:  [phone]   03/23/2016, 11:42 AM

## 2016-03-30 ENCOUNTER — Other Ambulatory Visit: Payer: Self-pay | Admitting: "Endocrinology

## 2016-03-30 MED ORDER — SITAGLIPTIN PHOSPHATE 25 MG PO TABS: 25.0000 mg | ORAL_TABLET | Freq: Every day | ORAL | 0 refills | Status: DC

## 2016-06-24 LAB — HEMOGLOBIN A1C: HEMOGLOBIN A1C: 9

## 2016-06-29 ENCOUNTER — Other Ambulatory Visit: Payer: Self-pay

## 2016-06-29 ENCOUNTER — Ambulatory Visit: Payer: Medicare (Managed Care) | Admitting: Nutrition

## 2016-06-29 ENCOUNTER — Ambulatory Visit: Payer: Medicare Other | Admitting: "Endocrinology

## 2016-06-29 MED ORDER — "BD INSULIN SYRINGE U/F 30G X 1/2"" 1 ML MISC": 5 refills | Status: DC

## 2016-06-29 MED ORDER — PEN NEEDLES 31G X 8 MM MISC: 5 refills | Status: DC

## 2016-06-30 ENCOUNTER — Other Ambulatory Visit: Payer: Self-pay

## 2016-07-22 ENCOUNTER — Ambulatory Visit (INDEPENDENT_AMBULATORY_CARE_PROVIDER_SITE_OTHER): Payer: Medicare Other | Admitting: "Endocrinology

## 2016-07-22 ENCOUNTER — Encounter: Payer: Self-pay | Admitting: "Endocrinology

## 2016-07-22 ENCOUNTER — Encounter: Payer: Medicare Other | Attending: "Endocrinology | Admitting: Nutrition

## 2016-07-22 VITALS — BP 130/64 | HR 72 | Resp 18 | Ht 65.0 in | Wt 229.0 lb

## 2016-07-22 VITALS — Wt 229.0 lb

## 2016-07-22 DIAGNOSIS — E118 Type 2 diabetes mellitus with unspecified complications: Secondary | ICD-10-CM

## 2016-07-22 DIAGNOSIS — E782 Mixed hyperlipidemia: Secondary | ICD-10-CM | POA: Diagnosis not present

## 2016-07-22 DIAGNOSIS — Z794 Long term (current) use of insulin: Secondary | ICD-10-CM | POA: Insufficient documentation

## 2016-07-22 DIAGNOSIS — E1165 Type 2 diabetes mellitus with hyperglycemia: Secondary | ICD-10-CM | POA: Insufficient documentation

## 2016-07-22 DIAGNOSIS — N183 Chronic kidney disease, stage 3 (moderate): Secondary | ICD-10-CM | POA: Diagnosis not present

## 2016-07-22 DIAGNOSIS — E1122 Type 2 diabetes mellitus with diabetic chronic kidney disease: Secondary | ICD-10-CM | POA: Diagnosis not present

## 2016-07-22 DIAGNOSIS — I10 Essential (primary) hypertension: Secondary | ICD-10-CM | POA: Diagnosis not present

## 2016-07-22 DIAGNOSIS — IMO0002 Reserved for concepts with insufficient information to code with codable children: Secondary | ICD-10-CM

## 2016-07-22 DIAGNOSIS — Z713 Dietary counseling and surveillance: Secondary | ICD-10-CM | POA: Diagnosis present

## 2016-07-22 DIAGNOSIS — E669 Obesity, unspecified: Secondary | ICD-10-CM

## 2016-07-22 MED ORDER — INSULIN ASPART 100 UNIT/ML FLEXPEN: 18.0000 [IU] | PEN_INJECTOR | Freq: Three times a day (TID) | SUBCUTANEOUS | 1 refills | Status: DC

## 2016-07-22 MED ORDER — BASAGLAR KWIKPEN 100 UNIT/ML ~~LOC~~ SOPN: 80.0000 [IU] | PEN_INJECTOR | Freq: Every day | SUBCUTANEOUS | 2 refills | Status: DC

## 2016-07-22 NOTE — Patient Instructions (Signed)

## 2016-07-22 NOTE — Progress Notes (Signed)
  Medical Nutrition Therapy:  Appt start time: 900 end time:  930 Assessment:  Primary concerns today: DIabetes Type 2 DM  A1C up to 9.1% from 8.5%.  Lab Results  Component Value Date   HGBA1C 9.1 03/19/2016  Has problem with family eating late. Now has an exercise bike. Isn't sleeping well.Has been sleeping in late in am and getting meal times off schedule. Has cut out snacks. Drinking water now but admits she need to eat more lower carb vegetables, fresh fruit and eat meal on time and not eat late at night.    Admits to eating salty and sweet stuff and sees pattern of elevated blood sugars.  Preferred Learning Style:  No preference indicated   Learning Readiness:  Ready  Change in progress   MEDICATIONS: see list   DIETARY INTAKE:   24-hr recall:  B (9-10  AM) Oatmeal flavored or plain, coffeeSnk ( AM): none  L ( PM): [place] sandwich on white bread and 15 chips, water 20 oz. D)  3 tacos- hard shells,  Snacks 1/2 c ice cream.  Beverages: water, Usual physical activity: walking and riding exercise  bike.   Estimated energy needs: 1500 calories 170 g carbohydrates 112 g protein 42 g fat  Progress Towards Goal(s):  In progress.   Nutritional Diagnosis:  NB-1.1 Food and nutrition-related knowledge deficit As related to Diabetes.  As evidenced by A1C 8.5%.    Intervention: Nutrition and Diabetes education provided on My Plate, CHO counting, meal planning, portion sizes, timing of meals, avoiding snacks between meals unless having a low blood sugar, target ranges for A1C and blood sugars, signs/symptoms and treatment of hyper/hypoglycemia, monitoring blood sugars, taking medications as prescribed, benefits of exercising 30 minutes per day and prevention of complications of DM.   Goals 1. Add protein with breakfast 2.Eat fruit with meals for sweet tooth 3. Cut down on salty foods 4. Exercise on bike for 15 minutes per day or walk for 30 minutes.  5. Get A1C to 8% Lose 1  lb per week. Be careful about pedicures.  Teaching Method Utilized:  Visual Auditory Hands on  Handouts given during visit include:  The Plate Method   Meal Plan Card  Diabetes Instructions.   Barriers to learning/adherence to lifestyle change:  None  Demonstrated degree of understanding via:  Teach Back   Monitoring/Evaluation:  Dietary intake, exercise, meal planning, SBG, and body weight in 1-2 month(s).

## 2016-07-22 NOTE — Progress Notes (Signed)
Subjective:    Patient ID: Tiffany Brock, female    DOB: Sep 10, 1945. Patient is being seen in f/u for management of diabetes requested by  Thea Alken  Past Medical History:  Diagnosis Date  . Diabetes mellitus, type II ([place])   . Vertigo    Past Surgical History:  Procedure Laterality Date  . ABDOMINAL HYSTERECTOMY    . APPENDECTOMY     Social History   Social History  . Marital status: Married    Spouse name: N/A  . Number of children: N/A  . Years of education: N/A   Social History Main Topics  . Smoking status: Never Smoker  . Smokeless tobacco: Never Used  . Alcohol use None  . Drug use: Unknown  . Sexual activity: Not Asked   Other Topics Concern  . None   Social History Narrative  . None   Outpatient Encounter Prescriptions as of 07/22/2016  Medication Sig  . B-D INS SYR ULTRAFINE 1CC/30G 30G X 1/2" 1 ML MISC use as directed qd  . esomeprazole (NEXIUM) 40 MG capsule Take 40 mg by mouth daily.  . insulin aspart (NOVOLOG) 100 UNIT/ML FlexPen Inject 18-24 Units into the skin 3 (three) times daily with meals.  . Insulin Pen Needle (PEN NEEDLES) 31G X 8 MM MISC Use tid as directed  . KLOR-CON M20 20 MEQ tablet Take 20 mEq by mouth daily.  Marland Kitchen losartan-hydrochlorothiazide (HYZAAR) 50-12.5 MG tablet Take 1 tablet by mouth daily.  Marland Kitchen PARoxetine (PAXIL) 10 MG tablet Take 10 mg by mouth daily.  . simvastatin (ZOCOR) 40 MG tablet Take 40 mg by mouth daily.  . sitaGLIPtin (JANUVIA) 25 MG tablet Take 1 tablet (25 mg total) by mouth daily.  . Vitamin D, Ergocalciferol, (DRISDOL) 50000 units CAPS capsule Take 50,000 Units by mouth as directed.  . [DISCONTINUED] insulin aspart (NOVOLOG) 100 UNIT/ML FlexPen Inject 15-21 Units into the skin 3 (three) times daily with meals.  . [DISCONTINUED] LANTUS 100 UNIT/ML injection Inject 80 Units into the skin at bedtime.  . Insulin Glargine (BASAGLAR KWIKPEN) 100 UNIT/ML SOPN Inject 0.8 mLs (80 Units total) into the skin daily  at 10 pm.   No facility-administered encounter medications on file as of 07/22/2016.    ALLERGIES: Allergies  Allergen Reactions  . Biaxin [Clarithromycin] Hives   VACCINATION STATUS:  There is no immunization history on file for this patient.  Diabetes  She presents for her follow-up diabetic visit. She has type 2 diabetes mellitus. Onset time: She was diagnosed at approximate age of 54 years. Her disease course has been stable. There are no hypoglycemic associated symptoms. Pertinent negatives for hypoglycemia include no confusion, headaches, pallor or seizures. Associated symptoms include blurred vision, fatigue, polydipsia and polyuria. Pertinent negatives for diabetes include no chest pain and no polyphagia. There are no hypoglycemic complications. Symptoms are stable. Risk factors for coronary artery disease include diabetes mellitus, dyslipidemia, family history, hypertension, obesity and sedentary lifestyle. Current diabetic treatment includes insulin injections and oral agent (dual therapy). Her weight is stable. She is following a generally unhealthy diet. When asked about meal planning, she reported none. She has not had a previous visit with a dietitian. She never participates in exercise. Her breakfast blood glucose range is generally >200 mg/dl. Her lunch blood glucose range is generally >200 mg/dl. Her dinner blood glucose range is generally >200 mg/dl. Her overall blood glucose range is >200 mg/dl. An ACE inhibitor/angiotensin II receptor blocker is being taken. She sees a  podiatrist.Eye exam is current.  Hyperlipidemia  This is a chronic problem. The current episode started more than 1 year ago. The problem is controlled. Exacerbating diseases include diabetes and obesity. Pertinent negatives include no chest pain, myalgias or shortness of breath. Current antihyperlipidemic treatment includes statins. Risk factors for coronary artery disease include diabetes mellitus, dyslipidemia,  family history, obesity, hypertension and a sedentary lifestyle.  Hypertension  This is a chronic problem. The current episode started more than 1 year ago. Associated symptoms include blurred vision. Pertinent negatives include no chest pain, headaches, palpitations or shortness of breath. Risk factors for coronary artery disease include dyslipidemia, diabetes mellitus, family history, obesity and sedentary lifestyle. Past treatments include angiotensin blockers.     Review of Systems  Constitutional: Positive for fatigue. Negative for activity change, chills, fever and unexpected weight change.  HENT: Negative for trouble swallowing and voice change.   Eyes: Positive for blurred vision. Negative for visual disturbance.  Respiratory: Negative for cough, shortness of breath and wheezing.   Cardiovascular: Negative for chest pain, palpitations and leg swelling.  Gastrointestinal: Negative for diarrhea, nausea and vomiting.  Endocrine: Positive for polydipsia and polyuria. Negative for cold intolerance, heat intolerance and polyphagia.  Genitourinary: Positive for frequency. Negative for dysuria and flank pain.  Musculoskeletal: Negative for arthralgias and myalgias.  Skin: Negative for color change, pallor, rash and wound.  Neurological: Negative for seizures and headaches.  Psychiatric/Behavioral: Negative for confusion and suicidal ideas.    Objective:    BP 130/64   Pulse 72   Resp 18   Ht '5\' 5"'  (1.651 m)   Wt 229 lb (103.9 kg)   SpO2 97%   BMI 38.11 kg/m   Wt Readings from Last 3 Encounters:  07/22/16 229 lb (103.9 kg)  07/22/16 229 lb (103.9 kg)  03/23/16 226 lb (102.5 kg)    Physical Exam  Constitutional: She is oriented to person, place, and time. She appears well-developed.  Obese.  HENT:  Head: Normocephalic and atraumatic.  Eyes: EOM are normal.  Neck: Normal range of motion. Neck supple. No tracheal deviation present. No thyromegaly present.  Cardiovascular:  Normal rate and regular rhythm.   Pulmonary/Chest: Effort normal and breath sounds normal.  Abdominal: Soft. Bowel sounds are normal. There is no tenderness. There is no guarding.  Musculoskeletal: Normal range of motion. She exhibits no edema.  Neurological: She is alert and oriented to person, place, and time. She has normal reflexes. No cranial nerve deficit. Coordination normal.  Skin: Skin is warm and dry. No rash noted. No erythema. No pallor.  Psychiatric: She has a normal mood and affect. Judgment normal.    CMP     Component Value Date/Time   NA 138 12/13/2015   K 4.3 12/13/2015   CL 101 11/27/2009 1305   CO2 27 11/27/2009 1305   GLUCOSE 152 (H) 12/04/2009 1027   BUN 11 11/27/2009 1305   CREATININE 1.3 (A) 12/13/2015   CREATININE 1.02 11/27/2009 1305   CALCIUM 9.3 11/27/2009 1305   GFRNONAA 55 (L) 11/27/2009 1305   GFRAA  11/27/2009 1305    >60        The eGFR has been calculated using the MDRD equation. This calculation has not been validated in all clinical situations. eGFR's persistently <60 mL/min signify possible Chronic Kidney Disease.   Diabetic Labs (most recent): Lab Results  Component Value Date   HGBA1C 9 06/24/2016   HGBA1C 9.1 03/19/2016   HGBA1C 8.5 12/13/2015    Lipid Panel  Component Value Date/Time   CHOL 119 12/13/2015   TRIG 121 12/13/2015   HDL 39 12/13/2015   LDLCALC 56 12/13/2015     Assessment & Plan:   1. Uncontrolled type 2 diabetes mellitus with stage 3 chronic kidney disease, with long-term current use of insulin ([place])  - Patient has currently uncontrolled symptomatic type 2 DM since  71 years of age,  with most recent A1c of  9%, Unchanged from 9.1% last visit. However progressively increasing from 8.5 %. Recent labs reviewed, still showing stage 3 CKD.   Her diabetes is complicated by obesity , chronic kidney disease, and sedentary life and patient remains at a high risk for more acute and chronic complications of  diabetes which include CAD, CVA, CKD, retinopathy, and neuropathy. These are all discussed in detail with the patient.  - I have counseled the patient on diet management and weight loss, by adopting a carbohydrate restricted/protein rich diet.  - Suggestion is made for patient to avoid simple carbohydrates   from their diet including Cakes , Desserts, Ice Cream,  Soda (  diet and regular) , Sweet Tea , Candies,  Chips, Cookies, Artificial Sweeteners,   and "Sugar-free" Products . This will help patient to have stable blood glucose profile and potentially avoid unintended weight gain.  - I encouraged the patient to switch to  unprocessed or minimally processed complex starch and increased protein intake (animal or plant source), fruits, and vegetables.  - Patient is advised to stick to a routine mealtimes to eat 3 meals  a day and avoid unnecessary snacks ( to snack only to correct hypoglycemia).  - The patient will be scheduled with Jearld Fenton, RDN, CDE for individualized DM education.  - I have approached patient with the following individualized plan to manage diabetes and patient agrees:   - She still has significant dietary indiscretion. Based on her blood glucose profile and glycemic burden she will continue to need basal/bolus insulin. She agrees with plan. - I  will  Prescribe Basaglar in place of  Lantus and increased to 80 units QHS,  Increase NovoLog to 18 units 3 times a day before meals for pre-meal blood glucose above 90 mg/dL associated with strict monitoring of glucose  AC and HS.  -Patient is encouraged to call clinic for blood glucose levels less than 70 or above 300 mg /dl.  - I will continue  Januvia 25 mg by mouth every morning. - If her renal function improves she will be reconsidered for low-dose metformin on subsequent visits.  - Patient will be considered for incretin therapy as appropriate next visit. - Patient specific target  A1c;  LDL, HDL, Triglycerides, and   Waist Circumference were discussed in detail.  2) BP/HTN: Controlled. Continue current medications including ARB. 3) Lipids/HPL:  Controlled, LDL 52, continue simvastatin 40 mg by mouth daily at bedtime. Side effects and precautions discussed with her. 4)  Weight/Diet: CDE Consult will be initiated , exercise, and detailed carbohydrates information provided.  5) Chronic Care/Health Maintenance:  -Patient is on ARB and Statin medications and encouraged to continue to follow up with Ophthalmology, Podiatrist at least yearly or according to recommendations, and advised to   stay away from smoking. I have recommended yearly flu vaccine and pneumonia vaccination at least every 5 years; moderate intensity exercise for up to 150 minutes weekly; and  sleep for at least 7 hours a day.  - 25 minutes of time was spent on the care of this patient ,  50% of which was applied for counseling on diabetes complications and their preventions.  - Patient to bring meter and  blood glucose logs during their next visit.   - I advised patient to maintain close follow up with Ephriam Jenkins E for primary care needs.  Follow up plan: - Return in about 3 months (around 10/19/2016) for follow up with pre-visit labs, meter, and logs.  Glade Lloyd, MD Phone: [phone]  Fax: [phone]   07/22/2016, 9:35 AM

## 2016-07-22 NOTE — Patient Instructions (Addendum)
Goals 1. Add protein with breakfast 2.Eat fruit with meals for sweet tooth 3. Cut down on salty foods 4. Exercise on bike for 15 minutes per day or walk for 30 minutes.  5. Get A1C to 8% Lose 1 lb per week. Be careful about pedicures.

## 2016-07-23 ENCOUNTER — Other Ambulatory Visit: Payer: Self-pay | Admitting: "Endocrinology

## 2016-08-18 ENCOUNTER — Encounter: Payer: Medicare Other | Attending: "Endocrinology | Admitting: Nutrition

## 2016-08-18 VITALS — Ht 65.0 in | Wt 230.0 lb

## 2016-08-18 DIAGNOSIS — N183 Chronic kidney disease, stage 3 (moderate): Secondary | ICD-10-CM | POA: Insufficient documentation

## 2016-08-18 DIAGNOSIS — E1165 Type 2 diabetes mellitus with hyperglycemia: Secondary | ICD-10-CM | POA: Insufficient documentation

## 2016-08-18 DIAGNOSIS — E1122 Type 2 diabetes mellitus with diabetic chronic kidney disease: Secondary | ICD-10-CM | POA: Diagnosis not present

## 2016-08-18 DIAGNOSIS — E669 Obesity, unspecified: Secondary | ICD-10-CM

## 2016-08-18 DIAGNOSIS — IMO0002 Reserved for concepts with insufficient information to code with codable children: Secondary | ICD-10-CM

## 2016-08-18 DIAGNOSIS — Z794 Long term (current) use of insulin: Secondary | ICD-10-CM | POA: Diagnosis not present

## 2016-08-18 DIAGNOSIS — Z713 Dietary counseling and surveillance: Secondary | ICD-10-CM | POA: Diagnosis not present

## 2016-08-18 DIAGNOSIS — E118 Type 2 diabetes mellitus with unspecified complications: Secondary | ICD-10-CM

## 2016-08-18 NOTE — Progress Notes (Signed)
  Medical Nutrition Therapy:  Appt start time: 1000 end time:  1030 Assessment:  Primary concerns today: DIabetes Type 2 DM  A1C down to 9% from 9.1%.  Currently taking Lantus 80 units at night and Novolog 18 units with meals with sliding scale  According to her log sheets, she misunderstood how to follow the sliding scale. Wasn't giving herself insulin unless BS was > 150 and then wasn't following guidelines correctly. Missed some doses of insulin with meals.  Sees Dr. Fransico HimNida, Endocrinologist in May 2018   Feels tired, sleepy, increased thirst, no energy to exercise and slightly depressed. Was sleeping in and skipping breakfast most days.    Not exercising due to being too tired and not motivated. Re-educated. She feels like she now understands how to use sliding scale coverage and motivated to make improvements with diet and exercise to help improve blood sugars.  Lab Results  Component Value Date   HGBA1C 9 06/24/2016   CMP Latest Ref Rng & Units 12/13/2015 12/04/2009 11/27/2009  Glucose 70 - 99 mg/dL - 161(W152(H) 960(A227(H)  BUN 6 - 23 mg/dL - - 11  Creatinine 0.5 - 1.1 mg/dL 1.3(A) - 1.02  Sodium 137 - 147 mmol/L 138 139 137  Potassium 3.4 - 5.3 mmol/L 4.3 2.9(L) 3.9  Chloride 96 - 112 mEq/L - - 101  CO2 19 - 32 mEq/L - - 27  Calcium 8.4 - 10.5 mg/dL - - 9.3    Preferred Learning Style:  No preference indicated   Learning Readiness:  Ready  Change in progress   MEDICATIONS: see list   DIETARY INTAKE:   24-hr recall:  B (9-10  AM) Oatmeal flavored or plain, or coffee  Skips sometimes.  Snk ( AM): none  L ( PM): skipped D)  3 tacos- hard shells,  Snacks 1/2 c ice cream.  Beverages: water, Usual physical activity: walking and riding exercise  bike.   Estimated energy needs: 1500 calories 170 g carbohydrates 112 g protein 42 g fat  Progress Towards Goal(s):  In progress.   Nutritional Diagnosis:  NB-1.1 Food and nutrition-related knowledge deficit As related to Diabetes.   As evidenced by A1C 8.5%.    Intervention: Nutrition and Diabetes education provided on My Plate, CHO counting, meal planning, portion sizes, timing of meals, avoiding snacks between meals unless having a low blood sugar, target ranges for A1C and blood sugars, signs/symptoms and treatment of hyper/hypoglycemia, monitoring blood sugars, taking medications as prescribed, benefits of exercising 30 minutes per day and prevention of complications of DM. RE educated on sliding scale, my plate meal planning, and preventing complications and risks of CKD.   Goals 1.  Eat three meals per day at times discussesd B) 6-8 L) 12-2 pm and Dinner 5-7 pm/ 2.Follow sliding scale coverage with meals 3. Don't skip meals 4. Increase fresh fruits and vegetables 5. Drink only water 6. Exercise 15-30 minutes a day  Don't forget meal time insulin Take Novolog with you when you eat out Lose 1 lb per week  Teaching Method Utilized:  Visual Auditory Hands on  Handouts given during visit include:  The Plate Method   Meal Plan Card  Diabetes Instructions.   Barriers to learning/adherence to lifestyle change:  None  Demonstrated degree of understanding via:  Teach Back   Monitoring/Evaluation:  Dietary intake, exercise, meal planning, SBG, and body weight in 1-2 month(s).

## 2016-08-18 NOTE — Patient Instructions (Addendum)
Goals 1.  Eat three meals per day at times discussesd B) 6-8 L) 12-2 pm and Dinner 5-7 pm/ 2.Follow sliding scale coverage with meals 3. Don't skip meals 4. Increase fresh fruits and vegetables 5. Drink only water 6. Exercise 15-30 minutes a day  Don't forget meal time insulin Take Novolog with you when you eat out Lose 1 lb per week

## 2016-08-21 ENCOUNTER — Other Ambulatory Visit: Payer: Self-pay

## 2016-08-21 MED ORDER — BASAGLAR KWIKPEN 100 UNIT/ML ~~LOC~~ SOPN: 80.0000 [IU] | PEN_INJECTOR | Freq: Every day | SUBCUTANEOUS | 2 refills | Status: DC

## 2016-09-07 ENCOUNTER — Other Ambulatory Visit: Payer: Self-pay

## 2016-09-08 ENCOUNTER — Other Ambulatory Visit: Payer: Self-pay

## 2016-09-08 ENCOUNTER — Telehealth: Payer: Self-pay | Admitting: *Deleted

## 2016-09-08 MED ORDER — INSULIN LISPRO 100 UNIT/ML (KWIKPEN): 18.0000 [IU] | PEN_INJECTOR | Freq: Three times a day (TID) | SUBCUTANEOUS | 2 refills | Status: DC

## 2016-09-08 MED ORDER — INSULIN ASPART 100 UNIT/ML FLEXPEN: 18.0000 [IU] | PEN_INJECTOR | Freq: Three times a day (TID) | SUBCUTANEOUS | 2 refills | Status: DC

## 2016-09-08 NOTE — Telephone Encounter (Signed)
Patient called requesting another fast acting insulin patient states her insurance quit paying for novalog. [phone]

## 2016-09-14 ENCOUNTER — Telehealth: Payer: Self-pay | Admitting: Nutrition

## 2016-09-14 NOTE — Telephone Encounter (Signed)
VM left for pt to call and reschedule her appointment on Wednesday.

## 2016-09-16 ENCOUNTER — Ambulatory Visit: Payer: Medicare (Managed Care) | Admitting: Nutrition

## 2016-09-22 ENCOUNTER — Encounter: Payer: Medicare Other | Attending: Nurse Practitioner | Admitting: Nutrition

## 2016-09-22 VITALS — Ht 65.5 in | Wt 234.0 lb

## 2016-09-22 DIAGNOSIS — Z794 Long term (current) use of insulin: Secondary | ICD-10-CM | POA: Diagnosis not present

## 2016-09-22 DIAGNOSIS — E1122 Type 2 diabetes mellitus with diabetic chronic kidney disease: Secondary | ICD-10-CM | POA: Diagnosis not present

## 2016-09-22 DIAGNOSIS — N183 Chronic kidney disease, stage 3 (moderate): Secondary | ICD-10-CM | POA: Insufficient documentation

## 2016-09-22 DIAGNOSIS — IMO0002 Reserved for concepts with insufficient information to code with codable children: Secondary | ICD-10-CM

## 2016-09-22 DIAGNOSIS — E118 Type 2 diabetes mellitus with unspecified complications: Secondary | ICD-10-CM

## 2016-09-22 DIAGNOSIS — Z713 Dietary counseling and surveillance: Secondary | ICD-10-CM | POA: Insufficient documentation

## 2016-09-22 DIAGNOSIS — E1165 Type 2 diabetes mellitus with hyperglycemia: Secondary | ICD-10-CM | POA: Diagnosis not present

## 2016-09-22 NOTE — Progress Notes (Signed)
Medical Nutrition Therapy:  Appt start time: 1000 end time:  1030 Assessment:  Primary concerns today: DIabetes Type 2 DM. She is here today with her labs. BS are still elevated. She reports testing and taking meal time insulin as prescribed Novolog and Lantus/Basaglar at  night. Insurance is making her change over to [employer] and Humalog from Lantus and Novolog.  She reports testing before meals and using sliding scale with meals but hasn't always been writing down how many units she has been taking.   FBS 177-255 mg/dl and lunch 295-621, dinner 210-327 mg/dl and bedtime 308-657 mg/dl. She notes her BS this am was 169 and overall BS have been better this past week. Admits to having difficulty with not snacking late at night and overeating later at night. Realizes she is stress eating due to family member in nursing home.      Willing to improve her food choices, plan meals better and start exercising to get her weight down and improve blood sugars.       Sees Dr. Fransico Him in May 2018..  Lab Results  Component Value Date   HGBA1C 9 06/24/2016   CMP Latest Ref Rng & Units 12/13/2015 12/04/2009 11/27/2009  Glucose 70 - 99 mg/dL - 846(N) 629(B)  BUN 6 - 23 mg/dL - - 11  Creatinine 0.5 - 1.1 mg/dL 1.3(A) - 1.02  Sodium 137 - 147 mmol/L 138 139 137  Potassium 3.4 - 5.3 mmol/L 4.3 2.9(L) 3.9  Chloride 96 - 112 mEq/L - - 101  CO2 19 - 32 mEq/L - - 27  Calcium 8.4 - 10.5 mg/dL - - 9.3    Preferred Learning Style:  No preference indicated   Learning Readiness:  Ready  Change in progress   MEDICATIONS: see list   DIETARY INTAKE:   24-hr recall:  B (9-10  AM) Oatmeal flavored or plain, or coffee  Skips sometimes.  Snk ( AM): none  L ( PM): skipped D)  3 tacos- hard shells,  Snacks 1/2 c ice cream.  Beverages: water, Usual physical activity: walking and riding exercise  bike.   Estimated energy needs: 1500 calories 170 g carbohydrates 112 g protein 42 g fat  Progress Towards  Goal(s):  In progress.   Nutritional Diagnosis:  NB-1.1 Food and nutrition-related knowledge deficit As related to Diabetes.  As evidenced by A1C 8.5%.    Intervention: Nutrition and Diabetes education provided on My Plate, CHO counting, meal planning, portion sizes, timing of meals, avoiding snacks between meals unless having a low blood sugar, target ranges for A1C and blood sugars, signs/symptoms and treatment of hyper/hypoglycemia, monitoring blood sugars, taking medications as prescribed, benefits of exercising 30 minutes per day and prevention of complications of DM. RE educated on sliding scale, my plate meal planning, and preventing complications and risks of CKD.   Goals 1. Take insulin as prescribed-follow sliding scale before meals and take 80 units of Lantus/Basaglar at night.. 2. Drink only water 3. Start walking 15-30 minutes a day. 4. Test blood sugars before each meal and record on sheets and document amount of insulin taken. Cut out snacks. Don't eat past 7 pm. Take a sandwich and fruit/veggies for dinner if going to stay last at nursing home. Get A1C down to 8% in 3 months. Lose 5 lbs in next month.  Teaching Method Utilized:  Visual Auditory Hands on  Handouts given during visit include:  The Plate Method   Meal Plan Card  Diabetes Instructions.   Barriers to  learning/adherence to lifestyle change:  None  Demonstrated degree of understanding via:  Teach Back   Monitoring/Evaluation:  Dietary intake, exercise, meal planning, SBG, and body weight in 1-2 month(s).

## 2016-09-22 NOTE — Patient Instructions (Addendum)
Goals 1. Take insulin as prescribed-follow sliding scale before meals and take 80 units of Lantus/Basaglar at night.. 2. Drink only water 3. Start walking 15-30 minutes a day. 4. Test blood sugars before each meal and record on sheets and document amount of insulin taken. Cut out snacks. Don't eat past 7 pm. Take a sandwich and fruit/veggies for dinner if going to stay last at nursing home. Get A1C down to 8% in 3 months. Lose 5 lbs in next month.

## 2016-10-04 ENCOUNTER — Other Ambulatory Visit: Payer: Self-pay | Admitting: "Endocrinology

## 2016-10-19 ENCOUNTER — Telehealth: Payer: Self-pay | Admitting: Nutrition

## 2016-10-19 NOTE — Telephone Encounter (Signed)
Unable to leave message on phone. Busy. Rescheduled her appt til 5-24 at 77 after Nida.

## 2016-10-21 ENCOUNTER — Ambulatory Visit: Payer: Medicare Other | Admitting: "Endocrinology

## 2016-10-21 ENCOUNTER — Ambulatory Visit: Payer: Medicare (Managed Care) | Admitting: Nutrition

## 2016-11-09 LAB — HEMOGLOBIN A1C: HEMOGLOBIN A1C: 8.7

## 2016-11-12 ENCOUNTER — Ambulatory Visit (INDEPENDENT_AMBULATORY_CARE_PROVIDER_SITE_OTHER): Payer: Medicare Other | Admitting: "Endocrinology

## 2016-11-12 ENCOUNTER — Encounter: Payer: Medicare Other | Attending: Nurse Practitioner | Admitting: Nutrition

## 2016-11-12 ENCOUNTER — Encounter: Payer: Self-pay | Admitting: "Endocrinology

## 2016-11-12 VITALS — Ht 65.0 in | Wt 237.0 lb

## 2016-11-12 VITALS — BP 123/73 | HR 73 | Ht 65.0 in | Wt 237.0 lb

## 2016-11-12 DIAGNOSIS — E782 Mixed hyperlipidemia: Secondary | ICD-10-CM | POA: Diagnosis not present

## 2016-11-12 DIAGNOSIS — Z713 Dietary counseling and surveillance: Secondary | ICD-10-CM | POA: Insufficient documentation

## 2016-11-12 DIAGNOSIS — Z794 Long term (current) use of insulin: Secondary | ICD-10-CM | POA: Insufficient documentation

## 2016-11-12 DIAGNOSIS — E1122 Type 2 diabetes mellitus with diabetic chronic kidney disease: Secondary | ICD-10-CM

## 2016-11-12 DIAGNOSIS — N183 Chronic kidney disease, stage 3 (moderate): Secondary | ICD-10-CM | POA: Diagnosis not present

## 2016-11-12 DIAGNOSIS — IMO0002 Reserved for concepts with insufficient information to code with codable children: Secondary | ICD-10-CM

## 2016-11-12 DIAGNOSIS — I10 Essential (primary) hypertension: Secondary | ICD-10-CM | POA: Diagnosis not present

## 2016-11-12 DIAGNOSIS — E118 Type 2 diabetes mellitus with unspecified complications: Secondary | ICD-10-CM

## 2016-11-12 DIAGNOSIS — E1165 Type 2 diabetes mellitus with hyperglycemia: Secondary | ICD-10-CM

## 2016-11-12 DIAGNOSIS — E669 Obesity, unspecified: Secondary | ICD-10-CM

## 2016-11-12 NOTE — Progress Notes (Signed)
Subjective:    Patient ID: Tiffany Brock, female    DOB: 1945/10/22. Patient is being seen in f/u for management of diabetes requested by  Thea Alken  Past Medical History:  Diagnosis Date  . Diabetes mellitus, type II ([place])   . Vertigo    Past Surgical History:  Procedure Laterality Date  . ABDOMINAL HYSTERECTOMY    . APPENDECTOMY     Social History   Social History  . Marital status: Married    Spouse name: N/A  . Number of children: N/A  . Years of education: N/A   Social History Main Topics  . Smoking status: Never Smoker  . Smokeless tobacco: Never Used  . Alcohol use None  . Drug use: Unknown  . Sexual activity: Not Asked   Other Topics Concern  . None   Social History Narrative  . None   Outpatient Encounter Prescriptions as of 11/12/2016  Medication Sig  . B-D INS SYR ULTRAFINE 1CC/30G 30G X 1/2" 1 ML MISC use as directed qd  . esomeprazole (NEXIUM) 40 MG capsule Take 40 mg by mouth daily.  . insulin aspart (NOVOLOG) 100 UNIT/ML FlexPen Inject 18-24 Units into the skin 3 (three) times daily with meals.  . Insulin Glargine (BASAGLAR KWIKPEN) 100 UNIT/ML SOPN Inject 0.8 mLs (80 Units total) into the skin daily at 10 pm.  . insulin lispro (HUMALOG KWIKPEN) 100 UNIT/ML KiwkPen Inject 0.18-0.24 mLs (18-24 Units total) into the skin 3 (three) times daily.  . Insulin Pen Needle (PEN NEEDLES) 31G X 8 MM MISC Use tid as directed  . JANUVIA 25 MG tablet TAKE 1 TABLET BY MOUTH EVERY DAY  . KLOR-CON M20 20 MEQ tablet Take 20 mEq by mouth daily.  Marland Kitchen losartan-hydrochlorothiazide (HYZAAR) 50-12.5 MG tablet Take 1 tablet by mouth daily.  Marland Kitchen PARoxetine (PAXIL) 10 MG tablet Take 10 mg by mouth daily.  . simvastatin (ZOCOR) 40 MG tablet Take 40 mg by mouth daily.  . Vitamin D, Ergocalciferol, (DRISDOL) 50000 units CAPS capsule Take 50,000 Units by mouth as directed.   No facility-administered encounter medications on file as of 11/12/2016.    ALLERGIES: Allergies   Allergen Reactions  . Biaxin [Clarithromycin] Hives   VACCINATION STATUS:  There is no immunization history on file for this patient.  Diabetes  She presents for her follow-up diabetic visit. She has type 2 diabetes mellitus. Onset time: She was diagnosed at approximate age of 48 years. Her disease course has been improving. There are no hypoglycemic associated symptoms. Pertinent negatives for hypoglycemia include no confusion, headaches, pallor or seizures. Associated symptoms include blurred vision, fatigue, polydipsia and polyuria. Pertinent negatives for diabetes include no chest pain and no polyphagia. There are no hypoglycemic complications. Symptoms are improving. Risk factors for coronary artery disease include diabetes mellitus, dyslipidemia, family history, hypertension, obesity and sedentary lifestyle. Current diabetic treatment includes insulin injections and oral agent (dual therapy). Her weight is stable. She is following a generally unhealthy diet. When asked about meal planning, she reported none. She has not had a previous visit with a dietitian. She never participates in exercise. Her breakfast blood glucose range is generally 180-200 mg/dl. Her lunch blood glucose range is generally 180-200 mg/dl. Her dinner blood glucose range is generally >200 mg/dl. Her overall blood glucose range is 180-200 mg/dl. An ACE inhibitor/angiotensin II receptor blocker is being taken. She sees a podiatrist.Eye exam is current.  Hyperlipidemia  This is a chronic problem. The current episode started more  than 1 year ago. The problem is controlled. Exacerbating diseases include diabetes and obesity. Pertinent negatives include no chest pain, myalgias or shortness of breath. Current antihyperlipidemic treatment includes statins. Risk factors for coronary artery disease include diabetes mellitus, dyslipidemia, family history, obesity, hypertension and a sedentary lifestyle.  Hypertension  This is a chronic  problem. The current episode started more than 1 year ago. Associated symptoms include blurred vision. Pertinent negatives include no chest pain, headaches, palpitations or shortness of breath. Risk factors for coronary artery disease include dyslipidemia, diabetes mellitus, family history, obesity and sedentary lifestyle. Past treatments include angiotensin blockers.     Review of Systems  Constitutional: Positive for fatigue. Negative for activity change, chills, fever and unexpected weight change.  HENT: Negative for trouble swallowing and voice change.   Eyes: Positive for blurred vision. Negative for visual disturbance.  Respiratory: Negative for cough, shortness of breath and wheezing.   Cardiovascular: Negative for chest pain, palpitations and leg swelling.  Gastrointestinal: Negative for diarrhea, nausea and vomiting.  Endocrine: Positive for polydipsia and polyuria. Negative for cold intolerance, heat intolerance and polyphagia.  Genitourinary: Positive for frequency. Negative for dysuria and flank pain.  Musculoskeletal: Negative for arthralgias and myalgias.  Skin: Negative for color change, pallor, rash and wound.  Neurological: Negative for seizures and headaches.  Psychiatric/Behavioral: Negative for confusion and suicidal ideas.    Objective:    BP 123/73   Pulse 73   Ht '5\' 5"'$  (1.651 m)   Wt 237 lb (107.5 kg)   BMI 39.44 kg/m   Wt Readings from Last 3 Encounters:  11/12/16 237 lb (107.5 kg)  11/12/16 237 lb (107.5 kg)  09/22/16 234 lb (106.1 kg)    Physical Exam  Constitutional: She is oriented to person, place, and time. She appears well-developed.  Obese.  HENT:  Head: Normocephalic and atraumatic.  Eyes: EOM are normal.  Neck: Normal range of motion. Neck supple. No tracheal deviation present. No thyromegaly present.  Cardiovascular: Normal rate and regular rhythm.   Pulmonary/Chest: Effort normal and breath sounds normal.  Abdominal: Soft. Bowel sounds are  normal. There is no tenderness. There is no guarding.  Musculoskeletal: Normal range of motion. She exhibits no edema.  Neurological: She is alert and oriented to person, place, and time. She has normal reflexes. No cranial nerve deficit. Coordination normal.  Skin: Skin is warm and dry. No rash noted. No erythema. No pallor.  Psychiatric: She has a normal mood and affect. Judgment normal.    CMP     Component Value Date/Time   NA 138 12/13/2015   K 4.3 12/13/2015   CL 101 11/27/2009 1305   CO2 27 11/27/2009 1305   GLUCOSE 152 (H) 12/04/2009 1027   BUN 11 11/27/2009 1305   CREATININE 1.3 (A) 12/13/2015   CREATININE 1.02 11/27/2009 1305   CALCIUM 9.3 11/27/2009 1305   GFRNONAA 55 (L) 11/27/2009 1305   GFRAA  11/27/2009 1305    >60        The eGFR has been calculated using the MDRD equation. This calculation has not been validated in all clinical situations. eGFR's persistently <60 mL/min signify possible Chronic Kidney Disease.   Diabetic Labs (most recent): Lab Results  Component Value Date   HGBA1C 8.7 11/09/2016   HGBA1C 9 06/24/2016   HGBA1C 9.1 03/19/2016    Lipid Panel     Component Value Date/Time   CHOL 119 12/13/2015   TRIG 121 12/13/2015   HDL 39 12/13/2015   LDLCALC  56 12/13/2015     Assessment & Plan:   1. Uncontrolled type 2 diabetes mellitus with stage 3 chronic kidney disease, with long-term current use of insulin ([place])  - Patient has currently uncontrolled symptomatic type 2 DM since  71 years of age. - She came with A1c slightly improving to 8.7% from 9.1%.  Recent labs  did not include CMP, prior to her last visit CMP showed  stage 3 CKD.   Her diabetes is complicated by obesity , chronic kidney disease, and sedentary life and patient remains at a high risk for more acute and chronic complications of diabetes which include CAD, CVA, CKD, retinopathy, and neuropathy. These are all discussed in detail with the patient.  - I have counseled the  patient on diet management and weight loss, by adopting a carbohydrate restricted/protein rich diet.  - Suggestion is made for patient to avoid simple carbohydrates   from her diet including Cakes , Desserts, Ice Cream,  Soda (  diet and regular) , Sweet Tea , Candies,  Chips, Cookies, Artificial Sweeteners,   and "Sugar-free" Products . This will help patient to have stable blood glucose profile and potentially avoid unintended weight gain.  - I encouraged the patient to switch to  unprocessed or minimally processed complex starch and increased protein intake (animal or plant source), fruits, and vegetables.  - Patient is advised to stick to a routine mealtimes to eat 3 meals  a day and avoid unnecessary snacks ( to snack only to correct hypoglycemia).  - The patient will be scheduled with Jearld Fenton, RDN, CDE for individualized DM education.  - I have approached patient with the following individualized plan to manage diabetes and patient agrees:   - She still has significant dietary indiscretion. Based on her blood glucose profile and glycemic burden she will continue to need basal/bolus insulin. She agrees with plan. - I  will  continue Lantus  80 units QHS,  NovoLog 18 units 3 times a day before meals for pre-meal blood glucose above 90 mg/dL associated with strict monitoring of glucose  AC and HS.  -Patient is encouraged to call clinic for blood glucose levels less than 70 or above 300 mg /dl.  - I will continue  Januvia 25 mg by mouth every morning. - If her renal function improves she will be reconsidered for low-dose metformin on subsequent visits.  - Patient specific target  A1c;  LDL, HDL, Triglycerides, and  Waist Circumference were discussed in detail.  2) BP/HTN: Controlled. Continue current medications including ARB. 3) Lipids/HPL:  Controlled, LDL 52, continue simvastatin 40 mg by mouth daily at bedtime. Side effects and precautions discussed with her. 4)  Weight/Diet: CDE  Consult will be initiated , exercise, and detailed carbohydrates information provided.  5) Chronic Care/Health Maintenance:  -Patient is on ARB and Statin medications and encouraged to continue to follow up with Ophthalmology, Podiatrist at least yearly or according to recommendations, and advised to   stay away from smoking. I have recommended yearly flu vaccine and pneumonia vaccination at least every 5 years; moderate intensity exercise for up to 150 minutes weekly; and  sleep for at least 7 hours a day.  - 25 minutes of time was spent on the care of this patient , 50% of which was applied for counseling on diabetes complications and their preventions.  - Patient to bring meter and  blood glucose logs during her next visit.   - I advised patient to maintain close follow  up with Ephriam Jenkins E for primary care needs.  Follow up plan: - Return in about 3 months (around 02/12/2017) for follow up with pre-visit labs, meter, and logs.  Glade Lloyd, MD Phone: [phone]  Fax: [phone]   11/12/2016, 11:40 AM

## 2016-11-12 NOTE — Patient Instructions (Addendum)
Goals 1. Eat 3 meals per day 2. Don't skip meals 3. Increase fresh fruits and vegetables. 4.Use sliding scale with meals Smart Ones or Healthy Choice TV as options for meals

## 2016-11-12 NOTE — Progress Notes (Signed)
  Medical Nutrition Therapy:  Appt start time: 1000 end time:  1030 Assessment:  Primary concerns today: DIabetes Type 2 DM.   Follow up DM A1C 8.7%. Having to care for inlaws who have dementia and therefore is aving a lot of stress in her life. Has to care and prepare foods for them. Not much other familiy help taking care of them.  Currently 80 units of Lantus/Basaglar and 18 units of Novolog with meals and sliding scale. Sometimes forgets to get lunch due to caring for inlaws. They are trying to get some help to care for them. Feels overwhelmed and depressed at times.         Riding exercise bike 3-4 times week when she can. Log sheets brought in with her meter. BS 150-250's.   Wt up 3 lbs due to increased insulin needs. Needs more lower carb vegetables and increased execise for weight loss and improved blood sugars.      Willing to improve her food choices, plan meals better and start exercising to get her weight down and improve blood sugars.       Sees Dr. Fransico HimNida today..  Lab Results  Component Value Date   HGBA1C 9 06/24/2016   CMP Latest Ref Rng & Units 12/13/2015 12/04/2009 11/27/2009  Glucose 70 - 99 mg/dL - 161(W152(H) 960(A227(H)  BUN 6 - 23 mg/dL - - 11  Creatinine 0.5 - 1.1 mg/dL 1.3(A) - 1.02  Sodium 137 - 147 mmol/L 138 139 137  Potassium 3.4 - 5.3 mmol/L 4.3 2.9(L) 3.9  Chloride 96 - 112 mEq/L - - 101  CO2 19 - 32 mEq/L - - 27  Calcium 8.4 - 10.5 mg/dL - - 9.3    Preferred Learning Style:  No preference indicated   Learning Readiness:  Ready  Change in progress   MEDICATIONS: see list   DIETARY INTAKE:   24-hr recall:  B (9-10  AM) Oatmeal flavored or plain, egg or coffee  Skips sometimes.  Snk ( AM): none  L ( PM): skipped; sandwich or soup usually, water D)  Meatloaf, mashed potatoes, water, green beans.    Beverages: water, Usual physical activity: walking and riding exercise  bike.   Estimated energy needs: 1500 calories 170 g carbohydrates 112 g  protein 42 g fat  Progress Towards Goal(s):  In progress.   Nutritional Diagnosis:  NB-1.1 Food and nutrition-related knowledge deficit As related to Diabetes.  As evidenced by A1C 8.5%.    Intervention: Nutrition and Diabetes education provided on My Plate, CHO counting, meal planning, portion sizes, timing of meals, avoiding snacks between meals unless having a low blood sugar, target ranges for A1C and blood sugars, signs/symptoms and treatment of hyper/hypoglycemia, monitoring blood sugars, taking medications as prescribed, benefits of exercising 30 minutes per day and prevention of complications of DM. RE educated on sliding scale, my plate meal planning, and preventing complications and risks of CKD.   Goals 1. Eat 3 meals per day 2. Don't skip meals 3. Increase fresh fruits and vegetables. 4.Use sliding scale with meals Smart Ones or Healthy Choice TV as options for meals  Teaching Method Utilized:  Visual Auditory Hands on  Handouts given during visit include:  The Plate Method   Meal Plan Card  Diabetes Instructions.   Barriers to learning/adherence to lifestyle change:  None  Demonstrated degree of understanding via:  Teach Back   Monitoring/Evaluation:  Dietary intake, exercise, meal planning, SBG, and body weight in 3 month(s).

## 2016-11-12 NOTE — Patient Instructions (Signed)

## 2016-12-06 ENCOUNTER — Other Ambulatory Visit: Payer: Self-pay | Admitting: "Endocrinology

## 2017-01-03 ENCOUNTER — Other Ambulatory Visit: Payer: Self-pay | Admitting: "Endocrinology

## 2017-02-12 ENCOUNTER — Encounter: Payer: Self-pay | Admitting: "Endocrinology

## 2017-02-12 ENCOUNTER — Ambulatory Visit (INDEPENDENT_AMBULATORY_CARE_PROVIDER_SITE_OTHER): Payer: Medicare Other | Admitting: "Endocrinology

## 2017-02-12 VITALS — BP 138/78 | HR 75 | Ht 65.0 in | Wt 243.0 lb

## 2017-02-12 DIAGNOSIS — N183 Chronic kidney disease, stage 3 (moderate): Secondary | ICD-10-CM | POA: Diagnosis not present

## 2017-02-12 DIAGNOSIS — I10 Essential (primary) hypertension: Secondary | ICD-10-CM | POA: Diagnosis not present

## 2017-02-12 DIAGNOSIS — E782 Mixed hyperlipidemia: Secondary | ICD-10-CM

## 2017-02-12 DIAGNOSIS — E1122 Type 2 diabetes mellitus with diabetic chronic kidney disease: Secondary | ICD-10-CM

## 2017-02-12 DIAGNOSIS — IMO0002 Reserved for concepts with insufficient information to code with codable children: Secondary | ICD-10-CM

## 2017-02-12 DIAGNOSIS — Z794 Long term (current) use of insulin: Secondary | ICD-10-CM | POA: Diagnosis not present

## 2017-02-12 DIAGNOSIS — E1165 Type 2 diabetes mellitus with hyperglycemia: Secondary | ICD-10-CM | POA: Diagnosis not present

## 2017-02-12 MED ORDER — FREESTYLE LIBRE SENSOR SYSTEM MISC: 2 refills | Status: DC

## 2017-02-12 MED ORDER — FREESTYLE LIBRE READER DEVI: 1.0000 | Freq: Once | 0 refills | Status: AC

## 2017-02-12 NOTE — Patient Instructions (Signed)

## 2017-02-12 NOTE — Progress Notes (Signed)
Subjective:    Patient ID: Tiffany Brock, female    DOB: 1946/03/08. Patient is being seen in f/u for management of diabetes requested by  Thea Alken  Past Medical History:  Diagnosis Date  . Diabetes mellitus, type II ([place])   . Vertigo    Past Surgical History:  Procedure Laterality Date  . ABDOMINAL HYSTERECTOMY    . APPENDECTOMY     Social History   Social History  . Marital status: Married    Spouse name: N/A  . Number of children: N/A  . Years of education: N/A   Social History Main Topics  . Smoking status: Never Smoker  . Smokeless tobacco: Never Used  . Alcohol use None  . Drug use: Unknown  . Sexual activity: Not Asked   Other Topics Concern  . None   Social History Narrative  . None   Outpatient Encounter Prescriptions as of 02/12/2017  Medication Sig  . B-D INS SYR ULTRAFINE 1CC/30G 30G X 1/2" 1 ML MISC use as directed qd  . Continuous Blood Gluc Receiver (FREESTYLE LIBRE READER) DEVI 1 Piece by Does not apply route once.  . Continuous Blood Gluc Sensor (FREESTYLE LIBRE SENSOR SYSTEM) MISC Use one sensor every 10 days.  Marland Kitchen esomeprazole (NEXIUM) 40 MG capsule Take 40 mg by mouth daily.  . Insulin Glargine (BASAGLAR KWIKPEN) 100 UNIT/ML SOPN Inject 0.8 mLs (80 Units total) into the skin daily at 10 pm.  . Insulin Pen Needle (PEN NEEDLES) 31G X 8 MM MISC Use tid as directed  . JANUVIA 25 MG tablet TAKE 1 TABLET BY MOUTH EVERY DAY  . KLOR-CON M20 20 MEQ tablet Take 20 mEq by mouth daily.  Marland Kitchen losartan-hydrochlorothiazide (HYZAAR) 50-12.5 MG tablet Take 1 tablet by mouth daily.  Marland Kitchen NOVOLOG FLEXPEN 100 UNIT/ML FlexPen INJECT 18-24 UNITS INTO THE SKIN 3 (THREE) TIMES DAILY WITH MEALS.  Marland Kitchen PARoxetine (PAXIL) 10 MG tablet Take 10 mg by mouth daily.  . simvastatin (ZOCOR) 40 MG tablet Take 40 mg by mouth daily.  . Vitamin D, Ergocalciferol, (DRISDOL) 50000 units CAPS capsule Take 50,000 Units by mouth as directed.  . [DISCONTINUED] insulin lispro (HUMALOG  KWIKPEN) 100 UNIT/ML KiwkPen Inject 0.18-0.24 mLs (18-24 Units total) into the skin 3 (three) times daily.   No facility-administered encounter medications on file as of 02/12/2017.    ALLERGIES: Allergies  Allergen Reactions  . Biaxin [Clarithromycin] Hives   VACCINATION STATUS:  There is no immunization history on file for this patient.  Diabetes  She presents for her follow-up diabetic visit. She has type 2 diabetes mellitus. Onset time: She was diagnosed at approximate age of 37 years. Her disease course has been improving. There are no hypoglycemic associated symptoms. Pertinent negatives for hypoglycemia include no confusion, headaches, pallor or seizures. Associated symptoms include blurred vision and fatigue. Pertinent negatives for diabetes include no chest pain, no polydipsia, no polyphagia and no polyuria. There are no hypoglycemic complications. Symptoms are improving. Risk factors for coronary artery disease include diabetes mellitus, dyslipidemia, family history, hypertension, obesity and sedentary lifestyle. Current diabetic treatment includes insulin injections and oral agent (dual therapy). Her weight is increasing steadily. She is following a generally unhealthy diet. When asked about meal planning, she reported none. She has had a previous visit with a dietitian. She never participates in exercise. Her breakfast blood glucose range is generally 180-200 mg/dl. Her lunch blood glucose range is generally 180-200 mg/dl. Her dinner blood glucose range is generally 180-200 mg/dl.  Her bedtime blood glucose range is generally >200 mg/dl. Her overall blood glucose range is 180-200 mg/dl. An ACE inhibitor/angiotensin II receptor blocker is being taken. She sees a podiatrist.Eye exam is current.  Hyperlipidemia  This is a chronic problem. The current episode started more than 1 year ago. The problem is controlled. Exacerbating diseases include diabetes and obesity. Pertinent negatives include  no chest pain, myalgias or shortness of breath. Current antihyperlipidemic treatment includes statins. Risk factors for coronary artery disease include diabetes mellitus, dyslipidemia, family history, obesity, hypertension and a sedentary lifestyle.  Hypertension  This is a chronic problem. The current episode started more than 1 year ago. Associated symptoms include blurred vision. Pertinent negatives include no chest pain, headaches, palpitations or shortness of breath. Risk factors for coronary artery disease include dyslipidemia, diabetes mellitus, family history, obesity and sedentary lifestyle. Past treatments include angiotensin blockers.     Review of Systems  Constitutional: Positive for fatigue. Negative for activity change, chills, fever and unexpected weight change.  HENT: Negative for trouble swallowing and voice change.   Eyes: Positive for blurred vision. Negative for visual disturbance.  Respiratory: Negative for cough, shortness of breath and wheezing.   Cardiovascular: Negative for chest pain, palpitations and leg swelling.  Gastrointestinal: Negative for diarrhea, nausea and vomiting.  Endocrine: Negative for cold intolerance, heat intolerance, polydipsia, polyphagia and polyuria.  Genitourinary: Positive for frequency. Negative for dysuria and flank pain.  Musculoskeletal: Negative for arthralgias and myalgias.  Skin: Negative for color change, pallor, rash and wound.  Neurological: Negative for seizures and headaches.  Psychiatric/Behavioral: Negative for confusion and suicidal ideas.    Objective:    BP 138/78   Pulse 75   Ht '5\' 5"'  (1.651 m)   Wt 243 lb (110.2 kg)   BMI 40.44 kg/m   Wt Readings from Last 3 Encounters:  02/12/17 243 lb (110.2 kg)  11/12/16 237 lb (107.5 kg)  11/12/16 237 lb (107.5 kg)    Physical Exam  Constitutional: She is oriented to person, place, and time. She appears well-developed.  Obese.  HENT:  Head: Normocephalic and atraumatic.   Eyes: EOM are normal.  Neck: Normal range of motion. Neck supple. No tracheal deviation present. No thyromegaly present.  Cardiovascular: Normal rate and regular rhythm.   Pulmonary/Chest: Effort normal and breath sounds normal.  Abdominal: Soft. Bowel sounds are normal. There is no tenderness. There is no guarding.  Musculoskeletal: Normal range of motion. She exhibits no edema.  Neurological: She is alert and oriented to person, place, and time. She has normal reflexes. No cranial nerve deficit. Coordination normal.  Skin: Skin is warm and dry. No rash noted. No erythema. No pallor.  Psychiatric: She has a normal mood and affect. Judgment normal.    CMP     Component Value Date/Time   NA 138 12/13/2015   K 4.3 12/13/2015   CL 101 11/27/2009 1305   CO2 27 11/27/2009 1305   GLUCOSE 152 (H) 12/04/2009 1027   BUN 11 11/27/2009 1305   CREATININE 1.3 (A) 12/13/2015   CREATININE 1.02 11/27/2009 1305   CALCIUM 9.3 11/27/2009 1305   GFRNONAA 55 (L) 11/27/2009 1305   GFRAA  11/27/2009 1305    >60        The eGFR has been calculated using the MDRD equation. This calculation has not been validated in all clinical situations. eGFR's persistently <60 mL/min signify possible Chronic Kidney Disease.   Diabetic Labs (most recent): Lab Results  Component Value Date  HGBA1C 8.7 11/09/2016   HGBA1C 9 06/24/2016   HGBA1C 9.1 03/19/2016    Lipid Panel     Component Value Date/Time   CHOL 119 12/13/2015   TRIG 121 12/13/2015   HDL 39 12/13/2015   LDLCALC 56 12/13/2015     Assessment & Plan:   1. Uncontrolled type 2 diabetes mellitus with stage 3 chronic kidney disease, with long-term current use of insulin ([place])  - Patient has currently uncontrolled symptomatic type 2 DM since  71 years of age. - She came with better  Glucose profile, A1c sowly improving to 8.4% from 9.1%.  Recent labs showed  stage 3 CKD.   Her diabetes is complicated by obesity , chronic kidney disease,  and sedentary life and patient remains at a high risk for more acute and chronic complications of diabetes which include CAD, CVA, CKD, retinopathy, and neuropathy. These are all discussed in detail with the patient.  - I have counseled the patient on diet management and weight loss, by adopting a carbohydrate restricted/protein rich diet.  - Suggestion is made for her to avoid simple carbohydrates  from her diet including Cakes, Sweet Desserts, Ice Cream, Soda (diet and regular), Sweet Tea, Candies, Chips, Cookies, Store Bought Juices, Alcohol in Excess of  1-2 drinks a day, Artificial Sweeteners, and "Sugar-free" Products. This will help patient to have stable blood glucose profile and potentially avoid unintended weight gain.   - I encouraged the patient to switch to  unprocessed or minimally processed complex starch and increased protein intake (animal or plant source), fruits, and vegetables.  - Patient is advised to stick to a routine mealtimes to eat 3 meals  a day and avoid unnecessary snacks ( to snack only to correct hypoglycemia).   - I have approached patient with the following individualized plan to manage diabetes and patient agrees:   - She still has significant dietary indiscretion. Based on her blood glucose profile and glycemic burden she will continue to need basal/bolus insulin. She agrees with plan. - I  will  continue Lantus  80 units QHS,  NovoLog 18 units 3 times a day before meals for pre-meal blood glucose above 90 mg/dL associated with strict monitoring of glucose 4 times a day-before meals and at bedtime. - She would benefit from continued glucose monitoring. I discussed and initiated  prescription for the [facility] device for her.  -Patient is encouraged to call clinic for blood glucose levels less than 70 or above 300 mg /dl.  - I will continue  Januvia 25 mg by mouth every morning. - If her renal function improves she will be reconsidered for low-dose metformin on  subsequent visits.  - Patient specific target  A1c;  LDL, HDL, Triglycerides, and  Waist Circumference were discussed in detail.  2) BP/HTN: Controlled. Continue current medications including ARB. 3) Lipids/HPL:  Controlled, LDL 52, continue simvastatin 40 mg by mouth daily at bedtime. Side effects and precautions discussed with her. 4)  Weight/Diet: CDE Consult has been initiated , exercise, and detailed carbohydrates information provided.  5) Chronic Care/Health Maintenance:  -Patient is on ARB and Statin medications and encouraged to continue to follow up with Ophthalmology, Podiatrist at least yearly or according to recommendations, and advised to   stay away from smoking. I have recommended yearly flu vaccine and pneumonia vaccination at least every 5 years; moderate intensity exercise for up to 150 minutes weekly; and  sleep for at least 7 hours a day.  - Time spent with  the patient: 25 min, of which >50% was spent in reviewing her sugar logs , discussing her hypo- and hyper-glycemic episodes, reviewing her current and  previous labs and insulin doses and developing a plan to avoid hypo- and hyper-glycemia.    - Patient to bring meter and  blood glucose logs during her next visit.   - I advised patient to maintain close follow up with Ephriam Jenkins E for primary care needs.  Follow up plan: - Return in about 3 months (around 05/15/2017) for follow up with pre-visit labs, meter, and logs.  Glade Lloyd, MD Phone: [phone]  Fax: [phone]  This note was partially dictated with voice recognition software. Similar sounding words can be transcribed inadequately or may not  be corrected upon review.  02/12/2017, 11:29 AM

## 2017-02-13 ENCOUNTER — Other Ambulatory Visit: Payer: Self-pay | Admitting: "Endocrinology

## 2017-04-15 ENCOUNTER — Other Ambulatory Visit: Payer: Self-pay | Admitting: "Endocrinology

## 2017-05-09 ENCOUNTER — Other Ambulatory Visit: Payer: Self-pay | Admitting: "Endocrinology

## 2017-05-21 LAB — BASIC METABOLIC PANEL
BUN: 21 (ref 4–21)
Creatinine: 1.2 — AB (ref 0.5–1.1)

## 2017-05-21 LAB — HEMOGLOBIN A1C: HEMOGLOBIN A1C: 9.5

## 2017-05-28 ENCOUNTER — Encounter: Payer: Self-pay | Admitting: "Endocrinology

## 2017-05-28 ENCOUNTER — Ambulatory Visit (INDEPENDENT_AMBULATORY_CARE_PROVIDER_SITE_OTHER): Payer: Medicare Other | Admitting: "Endocrinology

## 2017-05-28 VITALS — BP 136/77 | HR 81 | Ht 65.0 in | Wt 247.0 lb

## 2017-05-28 DIAGNOSIS — Z794 Long term (current) use of insulin: Secondary | ICD-10-CM

## 2017-05-28 DIAGNOSIS — IMO0002 Reserved for concepts with insufficient information to code with codable children: Secondary | ICD-10-CM

## 2017-05-28 DIAGNOSIS — N183 Chronic kidney disease, stage 3 (moderate): Secondary | ICD-10-CM | POA: Diagnosis not present

## 2017-05-28 DIAGNOSIS — E1165 Type 2 diabetes mellitus with hyperglycemia: Secondary | ICD-10-CM

## 2017-05-28 DIAGNOSIS — E1122 Type 2 diabetes mellitus with diabetic chronic kidney disease: Secondary | ICD-10-CM

## 2017-05-28 DIAGNOSIS — E782 Mixed hyperlipidemia: Secondary | ICD-10-CM

## 2017-05-28 DIAGNOSIS — I10 Essential (primary) hypertension: Secondary | ICD-10-CM

## 2017-05-28 MED ORDER — INSULIN ASPART 100 UNIT/ML FLEXPEN: 20.0000 [IU] | PEN_INJECTOR | Freq: Three times a day (TID) | SUBCUTANEOUS | 2 refills | Status: DC

## 2017-05-28 NOTE — Patient Instructions (Signed)

## 2017-05-28 NOTE — Progress Notes (Signed)
Subjective:    Patient ID: Tiffany Brock, female    DOB: 17-Mar-1946. Patient is being seen in f/u for management of diabetes requested by  Thea Alken  Past Medical History:  Diagnosis Date  . Diabetes mellitus, type II ([place])   . Vertigo    Past Surgical History:  Procedure Laterality Date  . ABDOMINAL HYSTERECTOMY    . APPENDECTOMY     Social History   Socioeconomic History  . Marital status: Married    Spouse name: Not on file  . Number of children: Not on file  . Years of education: Not on file  . Highest education level: Not on file  Social Needs  . Financial resource strain: Not on file  . Food insecurity - worry: Not on file  . Food insecurity - inability: Not on file  . Transportation needs - medical: Not on file  . Transportation needs - non-medical: Not on file  Occupational History  . Not on file  Tobacco Use  . Smoking status: Never Smoker  . Smokeless tobacco: Never Used  Substance and Sexual Activity  . Alcohol use: No    Frequency: Never  . Drug use: No  . Sexual activity: Not on file  Other Topics Concern  . Not on file  Social History Narrative  . Not on file   Outpatient Encounter Medications as of 05/28/2017  Medication Sig  . B-D INS SYR ULTRAFINE 1CC/30G 30G X 1/2" 1 ML MISC use as directed qd  . B-D ULTRAFINE III SHORT PEN 31G X 8 MM MISC USE 3 TIMES A DAY AS DIRECTED  . Continuous Blood Gluc Sensor (FREESTYLE LIBRE SENSOR SYSTEM) MISC Use one sensor every 10 days.  Marland Kitchen esomeprazole (NEXIUM) 40 MG capsule Take 40 mg by mouth daily.  . insulin aspart (NOVOLOG FLEXPEN) 100 UNIT/ML FlexPen Inject 20-26 Units into the skin 3 (three) times daily with meals.  . Insulin Glargine (BASAGLAR KWIKPEN) 100 UNIT/ML SOPN INJECT 80 UNITS ( 0.8 MLS) SUBQ DAILY AT 10 PM  . JANUVIA 25 MG tablet TAKE 1 TABLET BY MOUTH EVERY DAY  . KLOR-CON M20 20 MEQ tablet Take 20 mEq by mouth daily.  Marland Kitchen losartan-hydrochlorothiazide (HYZAAR) 50-12.5 MG tablet Take 1  tablet by mouth daily.  Marland Kitchen PARoxetine (PAXIL) 10 MG tablet Take 10 mg by mouth daily.  . simvastatin (ZOCOR) 40 MG tablet Take 40 mg by mouth daily.  . Vitamin D, Ergocalciferol, (DRISDOL) 50000 units CAPS capsule Take 50,000 Units by mouth as directed.  . [DISCONTINUED] NOVOLOG FLEXPEN 100 UNIT/ML FlexPen INJECT 18-24 UNITS INTO THE SKIN 3 (THREE) TIMES DAILY WITH MEALS.   No facility-administered encounter medications on file as of 05/28/2017.    ALLERGIES: Allergies  Allergen Reactions  . Biaxin [Clarithromycin] Hives   VACCINATION STATUS:  There is no immunization history on file for this patient.  Diabetes  She presents for her follow-up diabetic visit. She has type 2 diabetes mellitus. Onset time: She was diagnosed at approximate age of 89 years. Her disease course has been worsening. There are no hypoglycemic associated symptoms. Pertinent negatives for hypoglycemia include no confusion, headaches, pallor or seizures. Associated symptoms include blurred vision and fatigue. Pertinent negatives for diabetes include no chest pain, no polydipsia, no polyphagia and no polyuria. There are no hypoglycemic complications. Symptoms are worsening. Risk factors for coronary artery disease include diabetes mellitus, dyslipidemia, family history, hypertension, obesity and sedentary lifestyle. Current diabetic treatment includes insulin injections and oral agent (dual therapy).  Her weight is increasing steadily. She is following a generally unhealthy diet. When asked about meal planning, she reported none. She has had a previous visit with a dietitian. She never participates in exercise. Her breakfast blood glucose range is generally >200 mg/dl. Her lunch blood glucose range is generally >200 mg/dl. Her dinner blood glucose range is generally >200 mg/dl. Her bedtime blood glucose range is generally >200 mg/dl. Her overall blood glucose range is >200 mg/dl. An ACE inhibitor/angiotensin II receptor blocker is  being taken. She sees a podiatrist.Eye exam is current.  Hyperlipidemia  This is a chronic problem. The current episode started more than 1 year ago. The problem is controlled. Exacerbating diseases include diabetes and obesity. Pertinent negatives include no chest pain, myalgias or shortness of breath. Current antihyperlipidemic treatment includes statins. Risk factors for coronary artery disease include diabetes mellitus, dyslipidemia, family history, obesity, hypertension and a sedentary lifestyle.  Hypertension  This is a chronic problem. The current episode started more than 1 year ago. Associated symptoms include blurred vision. Pertinent negatives include no chest pain, headaches, palpitations or shortness of breath. Risk factors for coronary artery disease include dyslipidemia, diabetes mellitus, family history, obesity and sedentary lifestyle. Past treatments include angiotensin blockers.     Review of Systems  Constitutional: Positive for fatigue. Negative for activity change, chills, fever and unexpected weight change.  HENT: Negative for trouble swallowing and voice change.   Eyes: Positive for blurred vision. Negative for visual disturbance.  Respiratory: Negative for cough, shortness of breath and wheezing.   Cardiovascular: Negative for chest pain, palpitations and leg swelling.  Gastrointestinal: Negative for diarrhea, nausea and vomiting.  Endocrine: Negative for cold intolerance, heat intolerance, polydipsia, polyphagia and polyuria.  Genitourinary: Positive for frequency. Negative for dysuria and flank pain.  Musculoskeletal: Negative for arthralgias and myalgias.  Skin: Negative for color change, pallor, rash and wound.  Neurological: Negative for seizures and headaches.  Psychiatric/Behavioral: Negative for confusion and suicidal ideas.    Objective:    BP 136/77   Pulse 81   Ht '5\' 5"'  (1.651 m)   Wt 247 lb (112 kg)   BMI 41.10 kg/m   Wt Readings from Last 3  Encounters:  05/28/17 247 lb (112 kg)  02/12/17 243 lb (110.2 kg)  11/12/16 237 lb (107.5 kg)    Physical Exam  Constitutional: She is oriented to person, place, and time. She appears well-developed.  Obese.  HENT:  Head: Normocephalic and atraumatic.  Eyes: EOM are normal.  Neck: Normal range of motion. Neck supple. No tracheal deviation present. No thyromegaly present.  Cardiovascular: Normal rate and regular rhythm.  Pulmonary/Chest: Effort normal and breath sounds normal.  Abdominal: Soft. Bowel sounds are normal. There is no tenderness. There is no guarding.  Musculoskeletal: Normal range of motion. She exhibits no edema.  Neurological: She is alert and oriented to person, place, and time. She has normal reflexes. No cranial nerve deficit. Coordination normal.  Skin: Skin is warm and dry. No rash noted. No erythema. No pallor.  Psychiatric: She has a normal mood and affect. Judgment normal.    CMP     Component Value Date/Time   NA 138 12/13/2015   K 4.3 12/13/2015   CL 101 11/27/2009 1305   CO2 27 11/27/2009 1305   GLUCOSE 152 (H) 12/04/2009 1027   BUN 11 11/27/2009 1305   CREATININE 1.3 (A) 12/13/2015   CREATININE 1.02 11/27/2009 1305   CALCIUM 9.3 11/27/2009 1305   GFRNONAA 55 (L) 11/27/2009  [place]  11/27/2009 1305    >60        The eGFR has been calculated using the MDRD equation. This calculation has not been validated in all clinical situations. eGFR's persistently <60 mL/min signify possible Chronic Kidney Disease.   Diabetic Labs (most recent): Lab Results  Component Value Date   HGBA1C 8.7 11/09/2016   HGBA1C 9 06/24/2016   HGBA1C 9.1 03/19/2016    Lipid Panel     Component Value Date/Time   CHOL 119 12/13/2015   TRIG 121 12/13/2015   HDL 39 12/13/2015   LDLCALC 56 12/13/2015     Assessment & Plan:   1. Uncontrolled type 2 diabetes mellitus with stage 3 chronic kidney disease, with long-term current use of insulin ([place])  - Patient  has currently uncontrolled symptomatic type 2 DM since  71 years of age. - She came with loss of control of glycemic profile, despite wearing the continuous glucose monitoring sensor. - Her average blood glucose is 225 and her A1c is increasing to 9.5% from 8.4%.  Her diabetes is complicated by  CKD, obesity , and sedentary life and patient remains at a high risk for more acute and chronic complications of diabetes which include CAD, CVA, CKD, retinopathy, and neuropathy. These are all discussed in detail with the patient.  - I have counseled the patient on diet management and weight loss, by adopting a carbohydrate restricted/protein rich diet.  -  Suggestion is made for her to avoid simple carbohydrates  from her diet including Cakes, Sweet Desserts / Pastries, Ice Cream, Soda (diet and regular), Sweet Tea, Candies, Chips, Cookies, Store Bought Juices, Alcohol in Excess of  1-2 drinks a day, Artificial Sweeteners, and "Sugar-free" Products. This will help patient to have stable blood glucose profile and potentially avoid unintended weight gain.    - I encouraged the patient to switch to  unprocessed or minimally processed complex starch and increased protein intake (animal or plant source), fruits, and vegetables.  - Patient is advised to stick to a routine mealtimes to eat 3 meals  a day and avoid unnecessary snacks ( to snack only to correct hypoglycemia).   - I have approached patient with the following individualized plan to manage diabetes and patient agrees:   - She still has significant dietary indiscretion. She states that she is too busy with volunteering to care for self. -  Based on her blood glucose profile and glycemic burden she will continue to need basal/bolus insulin. - His reapproached for better engagement. - I  will  continue Lantus  80 units QHS, increase NovoLog to 20 units 3 times a day before meals for pre-meal blood glucose above 90 mg/dL associated with strict  monitoring of glucose 4 times a day-before meals and at bedtime. - She has obtained  continued glucose monitoring. -Patient is encouraged to call clinic for blood glucose levels less than 70 or above 300 mg /dl.  - I will continue  Januvia 25 mg by mouth every morning. - If her renal function continues to improve she will be reconsidered for low-dose metformin on subsequent visits.  - Patient specific target  A1c;  LDL, HDL, Triglycerides, and  Waist Circumference were discussed in detail.  2) BP/HTN: Controlled. I advised her to continue her current blood pressure medications including  ARB. 3) Lipids/HPL:  Controlled, LDL 52, continue simvastatin 40 mg by mouth daily at bedtime. Side effects and precautions discussed with her. 4)  Weight/Diet: CDE Consult  these in progress  , exercise, and detailed carbohydrates information provided.  5) Chronic Care/Health Maintenance:  -Patient is on ARB and Statin medications and encouraged to continue to follow up with Ophthalmology, Podiatrist at least yearly or according to recommendations, and advised to   stay away from smoking. I have recommended yearly flu vaccine and pneumonia vaccination at least every 5 years; moderate intensity exercise for up to 150 minutes weekly; and  sleep for at least 7 hours a day.  - I advised patient to maintain close follow up with Ephriam Jenkins E for primary care needs. - Time spent with the patient: 25 min, of which >50% was spent in reviewing her sugar logs , discussing her hypo- and hyper-glycemic episodes, reviewing her current and  previous labs and insulin doses and developing a plan to avoid hypo- and hyper-glycemia.    Follow up plan: - Return in about 3 months (around 08/26/2017) for follow up with pre-visit labs, meter, and logs.  Glade Lloyd, MD Phone: [phone]  Fax: [phone]  This note was partially dictated with voice recognition software. Similar sounding words can be transcribed inadequately  or may not  be corrected upon review.  05/28/2017, 12:44 PM

## 2017-07-27 ENCOUNTER — Other Ambulatory Visit: Payer: Self-pay | Admitting: "Endocrinology

## 2017-08-10 ENCOUNTER — Other Ambulatory Visit: Payer: Self-pay | Admitting: "Endocrinology

## 2017-08-26 LAB — VITAMIN D 25 HYDROXY (VIT D DEFICIENCY, FRACTURES): VIT D 25 HYDROXY: 22

## 2017-08-26 LAB — LIPID PANEL
Cholesterol: 128 (ref 0–200)
HDL: 37 (ref 35–70)
LDL Cholesterol: 67
Triglycerides: 120 (ref 40–160)

## 2017-08-26 LAB — BASIC METABOLIC PANEL
BUN: 19 (ref 4–21)
Creatinine: 1.2 — AB (ref <=1.1)

## 2017-08-26 LAB — TSH: TSH: 3.18 (ref <=5.90)

## 2017-08-26 LAB — HEMOGLOBIN A1C: Hemoglobin A1C: 9.4

## 2017-08-28 ENCOUNTER — Other Ambulatory Visit: Payer: Self-pay | Admitting: "Endocrinology

## 2017-08-31 ENCOUNTER — Encounter: Payer: Self-pay | Admitting: "Endocrinology

## 2017-08-31 ENCOUNTER — Ambulatory Visit: Payer: Medicare Other | Admitting: "Endocrinology

## 2017-08-31 VITALS — BP 137/85 | HR 81 | Ht 65.0 in | Wt 249.0 lb

## 2017-08-31 DIAGNOSIS — Z794 Long term (current) use of insulin: Secondary | ICD-10-CM

## 2017-08-31 DIAGNOSIS — N183 Chronic kidney disease, stage 3 (moderate): Secondary | ICD-10-CM

## 2017-08-31 DIAGNOSIS — IMO0002 Reserved for concepts with insufficient information to code with codable children: Secondary | ICD-10-CM

## 2017-08-31 DIAGNOSIS — E1165 Type 2 diabetes mellitus with hyperglycemia: Secondary | ICD-10-CM | POA: Diagnosis not present

## 2017-08-31 DIAGNOSIS — E782 Mixed hyperlipidemia: Secondary | ICD-10-CM | POA: Diagnosis not present

## 2017-08-31 DIAGNOSIS — E1122 Type 2 diabetes mellitus with diabetic chronic kidney disease: Secondary | ICD-10-CM | POA: Diagnosis not present

## 2017-08-31 DIAGNOSIS — I10 Essential (primary) hypertension: Secondary | ICD-10-CM | POA: Diagnosis not present

## 2017-08-31 MED ORDER — INSULIN ASPART 100 UNIT/ML FLEXPEN: 22.0000 [IU] | PEN_INJECTOR | Freq: Three times a day (TID) | SUBCUTANEOUS | 2 refills | Status: DC

## 2017-08-31 NOTE — Patient Instructions (Signed)

## 2017-08-31 NOTE — Progress Notes (Signed)
Subjective:    Patient ID: Tiffany Brock, female    DOB: 06-17-1946. Patient is being seen in f/u for management of diabetes requested by  Thea Alken  Past Medical History:  Diagnosis Date  . Diabetes mellitus, type II ([place])   . Vertigo    Past Surgical History:  Procedure Laterality Date  . ABDOMINAL HYSTERECTOMY    . APPENDECTOMY     Social History   Socioeconomic History  . Marital status: Married    Spouse name: None  . Number of children: None  . Years of education: None  . Highest education level: None  Social Needs  . Financial resource strain: None  . Food insecurity - worry: None  . Food insecurity - inability: None  . Transportation needs - medical: None  . Transportation needs - non-medical: None  Occupational History  . None  Tobacco Use  . Smoking status: Never Smoker  . Smokeless tobacco: Never Used  Substance and Sexual Activity  . Alcohol use: No    Frequency: Never  . Drug use: No  . Sexual activity: None  Other Topics Concern  . None  Social History Narrative  . None   Outpatient Encounter Medications as of 08/31/2017  Medication Sig  . B-D ULTRAFINE III SHORT PEN 31G X 8 MM MISC USE 3 TIMES A DAY AS DIRECTED  . Continuous Blood Gluc Sensor (FREESTYLE LIBRE SENSOR SYSTEM) MISC Use one sensor every 10 days.  Marland Kitchen esomeprazole (NEXIUM) 40 MG capsule Take 40 mg by mouth daily.  . insulin aspart (NOVOLOG FLEXPEN) 100 UNIT/ML FlexPen Inject 22-28 Units into the skin 3 (three) times daily with meals.  . Insulin Glargine (BASAGLAR KWIKPEN) 100 UNIT/ML SOPN INJECT 80 UNITS ( 0.8 MLS) SUBQ DAILY AT 10 PM  . JANUVIA 25 MG tablet TAKE 1 TABLET BY MOUTH EVERY DAY  . KLOR-CON M20 20 MEQ tablet Take 20 mEq by mouth daily.  Marland Kitchen losartan-hydrochlorothiazide (HYZAAR) 50-12.5 MG tablet Take 1 tablet by mouth daily.  Marland Kitchen PARoxetine (PAXIL) 10 MG tablet Take 10 mg by mouth daily.  . simvastatin (ZOCOR) 40 MG tablet Take 40 mg by mouth daily.  . Vitamin D,  Ergocalciferol, (DRISDOL) 50000 units CAPS capsule Take 50,000 Units by mouth as directed.  . [DISCONTINUED] B-D INS SYR ULTRAFINE 1CC/30G 30G X 1/2" 1 ML MISC use as directed qd  . [DISCONTINUED] insulin aspart (NOVOLOG FLEXPEN) 100 UNIT/ML FlexPen Inject 20-26 Units into the skin 3 (three) times daily with meals.   No facility-administered encounter medications on file as of 08/31/2017.    ALLERGIES: Allergies  Allergen Reactions  . Biaxin [Clarithromycin] Hives   VACCINATION STATUS:  There is no immunization history on file for this patient.  Diabetes  She presents for her follow-up diabetic visit. She has type 2 diabetes mellitus. Onset time: She was diagnosed at approximate age of 44 years. Her disease course has been worsening. There are no hypoglycemic associated symptoms. Pertinent negatives for hypoglycemia include no confusion, headaches, pallor or seizures. Associated symptoms include blurred vision, fatigue, polydipsia and polyuria. Pertinent negatives for diabetes include no chest pain and no polyphagia. There are no hypoglycemic complications. Symptoms are worsening. Risk factors for coronary artery disease include diabetes mellitus, dyslipidemia, family history, hypertension, obesity and sedentary lifestyle. Current diabetic treatment includes insulin injections and oral agent (dual therapy). Her weight is increasing steadily. She is following a generally unhealthy diet. When asked about meal planning, she reported none. She has had a previous  visit with a dietitian. She never participates in exercise. Her home blood glucose trend is increasing steadily. Her breakfast blood glucose range is generally >200 mg/dl. Her lunch blood glucose range is generally >200 mg/dl. Her dinner blood glucose range is generally >200 mg/dl. Her bedtime blood glucose range is generally >200 mg/dl. Her overall blood glucose range is >200 mg/dl. An ACE inhibitor/angiotensin II receptor blocker is being taken.  She sees a podiatrist.Eye exam is current.  Hyperlipidemia  This is a chronic problem. The current episode started more than 1 year ago. The problem is controlled. Exacerbating diseases include diabetes and obesity. Pertinent negatives include no chest pain, myalgias or shortness of breath. Current antihyperlipidemic treatment includes statins. Risk factors for coronary artery disease include diabetes mellitus, dyslipidemia, family history, obesity, hypertension and a sedentary lifestyle.  Hypertension  This is a chronic problem. The current episode started more than 1 year ago. Associated symptoms include blurred vision. Pertinent negatives include no chest pain, headaches, palpitations or shortness of breath. Risk factors for coronary artery disease include dyslipidemia, diabetes mellitus, family history, obesity and sedentary lifestyle. Past treatments include angiotensin blockers.     Review of Systems  Constitutional: Positive for fatigue. Negative for activity change, chills, fever and unexpected weight change.  HENT: Negative for trouble swallowing and voice change.   Eyes: Positive for blurred vision. Negative for visual disturbance.  Respiratory: Negative for cough, shortness of breath and wheezing.   Cardiovascular: Negative for chest pain, palpitations and leg swelling.  Gastrointestinal: Negative for diarrhea, nausea and vomiting.  Endocrine: Positive for polydipsia and polyuria. Negative for cold intolerance, heat intolerance and polyphagia.  Genitourinary: Positive for frequency. Negative for dysuria and flank pain.  Musculoskeletal: Negative for arthralgias and myalgias.  Skin: Negative for color change, pallor, rash and wound.  Neurological: Negative for seizures and headaches.  Psychiatric/Behavioral: Negative for confusion and suicidal ideas.    Objective:    BP 137/85   Pulse 81   Ht 5' 5" (1.651 m)   Wt 249 lb (112.9 kg)   BMI 41.44 kg/m   Wt Readings from Last 3  Encounters:  08/31/17 249 lb (112.9 kg)  05/28/17 247 lb (112 kg)  02/12/17 243 lb (110.2 kg)    Physical Exam  Constitutional: She is oriented to person, place, and time. She appears well-developed.  Obese.  HENT:  Head: Normocephalic and atraumatic.  Eyes: EOM are normal.  Neck: Normal range of motion. Neck supple. No tracheal deviation present. No thyromegaly present.  Cardiovascular: Normal rate and regular rhythm.  Pulmonary/Chest: Effort normal and breath sounds normal.  Abdominal: Soft. Bowel sounds are normal. There is no tenderness. There is no guarding.  Musculoskeletal: Normal range of motion. She exhibits no edema.  Neurological: She is alert and oriented to person, place, and time. She has normal reflexes. No cranial nerve deficit. Coordination normal.  Skin: Skin is warm and dry. No rash noted. No erythema. No pallor.  Psychiatric: She has a normal mood and affect. Judgment normal.    CMP     Component Value Date/Time   NA 138 12/13/2015   K 4.3 12/13/2015   CL 101 11/27/2009 1305   CO2 27 11/27/2009 1305   GLUCOSE 152 (H) 12/04/2009 1027   BUN 21 05/21/2017   CREATININE 1.2 (A) 05/21/2017   CREATININE 1.02 11/27/2009 1305   CALCIUM 9.3 11/27/2009 1305   GFRNONAA 55 (L) 11/27/2009 1305   GFRAA  11/27/2009 1305    >60  The eGFR has been calculated using the MDRD equation. This calculation has not been validated in all clinical situations. eGFR's persistently <60 mL/min signify possible Chronic Kidney Disease.   Diabetic Labs (most recent): Lab Results  Component Value Date   HGBA1C 9.5 05/21/2017   HGBA1C 8.7 11/09/2016   HGBA1C 9 06/24/2016    Lipid Panel     Component Value Date/Time   CHOL 119 12/13/2015   TRIG 121 12/13/2015   HDL 39 12/13/2015   LDLCALC 56 12/13/2015     Assessment & Plan:   1. Uncontrolled type 2 diabetes mellitus with stage 3 chronic kidney disease, with long-term current use of insulin ([place])  - Patient has  currently uncontrolled symptomatic type 2 DM since  72 years of age. - She came with stable but still above target blood glucose profile.  Her CGM average shows 254, 82% above target, only 17% within range.  Patient admits to dietary indiscretion.   Her diabetes is complicated by  CKD, obesity , and sedentary life and patient remains at a high risk for more acute and chronic complications of diabetes which include CAD, CVA, CKD, retinopathy, and neuropathy. These are all discussed in detail with the patient.  - I have counseled the patient on diet management and weight loss, by adopting a carbohydrate restricted/protein rich diet.  -  Suggestion is made for her to avoid simple carbohydrates  from her diet including Cakes, Sweet Desserts / Pastries, Ice Cream, Soda (diet and regular), Sweet Tea, Candies, Chips, Cookies, Store Bought Juices, Alcohol in Excess of  1-2 drinks a day, Artificial Sweeteners, and "Sugar-free" Products. This will help patient to have stable blood glucose profile and potentially avoid unintended weight gain.   - I encouraged the patient to switch to  unprocessed or minimally processed complex starch and increased protein intake (animal or plant source), fruits, and vegetables.  - Patient is advised to stick to a routine mealtimes to eat 3 meals  a day and avoid unnecessary snacks ( to snack only to correct hypoglycemia).   - I have approached patient with the following individualized plan to manage diabetes and patient agrees:   - She still has significant dietary indiscretion.  -  Based on her blood glucose profile and glycemic burden she will continue to need higher dose basal/bolus insulin. -She is approached for better dietary modification.    - I  will  Continue Basaglar   80 units at bedtime, increase NovoLog to 22 units  3 times a day before meals for pre-meal blood glucose above 90 mg/dL associated with strict monitoring of glucose 4 times a day-before meals and at  bedtime. - She has obtained  continued glucose monitoring, is advised to wear it at all times. -Patient is encouraged to call clinic for blood glucose levels less than 70 or above 300 mg /dl.  - I will continue  Januvia 25 mg by mouth every morning. - If her renal function continues to improve she will be reconsidered for low-dose metformin on subsequent visits.  - Patient specific target  A1c;  LDL, HDL, Triglycerides, and  Waist Circumference were discussed in detail.  2) BP/HTN: Her blood pressure is controlled to target.  I advised her to continue her current blood pressure medications including  ARB. 3) Lipids/HPL:  Controlled, LDL 52, continue simvastatin 40 mg by mouth daily at bedtime. Side effects and precautions discussed with her. 4)  Weight/Diet: CDE Consult these in progress  , exercise, and detailed  carbohydrates information provided.  5) Chronic Care/Health Maintenance:  -Patient is on ARB and Statin medications and encouraged to continue to follow up with Ophthalmology, Podiatrist at least yearly or according to recommendations, and advised to   stay away from smoking. I have recommended yearly flu vaccine and pneumonia vaccination at least every 5 years; moderate intensity exercise for up to 150 minutes weekly; and  sleep for at least 7 hours a day.  - I advised patient to maintain close follow up with Ephriam Jenkins E for primary care needs.  - Time spent with the patient: 25 min, of which >50% was spent in reviewing her blood glucose logs , discussing her hypo- and hyper-glycemic episodes, reviewing her current and  previous labs and insulin doses and developing a plan to avoid hypo- and hyper-glycemia. Please refer to Patient Instructions for Blood Glucose Monitoring and Insulin/Medications Dosing Guide"  in media tab for additional information.   Follow up plan: - Return in about 3 months (around 12/01/2017) for follow up with pre-visit labs, meter, and logs.  Glade Lloyd, MD Phone: [phone]  Fax: [phone]  This note was partially dictated with voice recognition software. Similar sounding words can be transcribed inadequately or may not  be corrected upon review.  08/31/2017, 1:43 PM

## 2017-11-05 ENCOUNTER — Other Ambulatory Visit: Payer: Self-pay | Admitting: "Endocrinology

## 2017-11-06 ENCOUNTER — Other Ambulatory Visit: Payer: Self-pay | Admitting: "Endocrinology

## 2017-11-18 ENCOUNTER — Other Ambulatory Visit: Payer: Self-pay | Admitting: "Endocrinology

## 2017-11-25 LAB — BASIC METABOLIC PANEL
BUN: 22 — AB (ref 4–21)
Creatinine: 1.3 — AB (ref 0.5–1.1)

## 2017-11-25 LAB — HEMOGLOBIN A1C: Hemoglobin A1C: 8.9

## 2017-12-02 ENCOUNTER — Ambulatory Visit: Payer: Medicare Other | Admitting: "Endocrinology

## 2017-12-02 ENCOUNTER — Encounter: Payer: Self-pay | Admitting: "Endocrinology

## 2017-12-02 VITALS — BP 136/70 | HR 76 | Ht 65.0 in | Wt 246.0 lb

## 2017-12-02 DIAGNOSIS — E782 Mixed hyperlipidemia: Secondary | ICD-10-CM

## 2017-12-02 DIAGNOSIS — I10 Essential (primary) hypertension: Secondary | ICD-10-CM | POA: Diagnosis not present

## 2017-12-02 DIAGNOSIS — E1165 Type 2 diabetes mellitus with hyperglycemia: Secondary | ICD-10-CM | POA: Diagnosis not present

## 2017-12-02 DIAGNOSIS — E1122 Type 2 diabetes mellitus with diabetic chronic kidney disease: Secondary | ICD-10-CM

## 2017-12-02 DIAGNOSIS — N183 Chronic kidney disease, stage 3 (moderate): Secondary | ICD-10-CM

## 2017-12-02 DIAGNOSIS — Z794 Long term (current) use of insulin: Secondary | ICD-10-CM | POA: Diagnosis not present

## 2017-12-02 DIAGNOSIS — IMO0002 Reserved for concepts with insufficient information to code with codable children: Secondary | ICD-10-CM

## 2017-12-02 MED ORDER — BASAGLAR KWIKPEN 100 UNIT/ML ~~LOC~~ SOPN: PEN_INJECTOR | SUBCUTANEOUS | 2 refills | Status: DC

## 2017-12-02 MED ORDER — INSULIN ASPART 100 UNIT/ML FLEXPEN: 25.0000 [IU] | PEN_INJECTOR | Freq: Three times a day (TID) | SUBCUTANEOUS | 2 refills | Status: DC

## 2017-12-02 MED ORDER — SITAGLIPTIN PHOSPHATE 50 MG PO TABS: 50.0000 mg | ORAL_TABLET | Freq: Every day | ORAL | 6 refills | Status: DC

## 2017-12-02 NOTE — Patient Instructions (Signed)

## 2017-12-02 NOTE — Progress Notes (Signed)
Subjective:    Patient ID: Tiffany Brock, female    DOB: 1946-05-03. Patient is being seen in follow-up for management of diabetes requested by  Thea Alken  Past Medical History:  Diagnosis Date  . Diabetes mellitus, type II ([place])   . Vertigo    Past Surgical History:  Procedure Laterality Date  . ABDOMINAL HYSTERECTOMY    . APPENDECTOMY     Social History   Socioeconomic History  . Marital status: Married    Spouse name: Not on file  . Number of children: Not on file  . Years of education: Not on file  . Highest education level: Not on file  Occupational History  . Not on file  Social Needs  . Financial resource strain: Not on file  . Food insecurity:    Worry: Not on file    Inability: Not on file  . Transportation needs:    Medical: Not on file    Non-medical: Not on file  Tobacco Use  . Smoking status: Never Smoker  . Smokeless tobacco: Never Used  Substance and Sexual Activity  . Alcohol use: No    Frequency: Never  . Drug use: No  . Sexual activity: Not on file  Lifestyle  . Physical activity:    Days per week: Not on file    Minutes per session: Not on file  . Stress: Not on file  Relationships  . Social connections:    Talks on phone: Not on file    Gets together: Not on file    Attends religious service: Not on file    Active member of club or organization: Not on file    Attends meetings of clubs or organizations: Not on file    Relationship status: Not on file  Other Topics Concern  . Not on file  Social History Narrative  . Not on file   Outpatient Encounter Medications as of 12/02/2017  Medication Sig  . B-D ULTRAFINE III SHORT PEN 31G X 8 MM MISC USE 3 TIMES DAILY AS DIRCTED  . Continuous Blood Gluc Sensor (FREESTYLE LIBRE SENSOR SYSTEM) MISC Use one sensor every 10 days.  Marland Kitchen esomeprazole (NEXIUM) 40 MG capsule Take 40 mg by mouth daily.  . insulin aspart (NOVOLOG FLEXPEN) 100 UNIT/ML FlexPen Inject 25-31 Units into the skin 3  (three) times daily with meals.  . Insulin Glargine (BASAGLAR KWIKPEN) 100 UNIT/ML SOPN INJECT 90 UNITS ( 0.8 MLS) SUBQ DAILY AT 10 PM  . JANUVIA 25 MG tablet TAKE 1 TABLET BY MOUTH EVERY DAY  . KLOR-CON M20 20 MEQ tablet Take 20 mEq by mouth daily.  Marland Kitchen losartan-hydrochlorothiazide (HYZAAR) 50-12.5 MG tablet Take 1 tablet by mouth daily.  Marland Kitchen PARoxetine (PAXIL) 10 MG tablet Take 10 mg by mouth daily.  . simvastatin (ZOCOR) 40 MG tablet Take 40 mg by mouth daily.  . Vitamin D, Ergocalciferol, (DRISDOL) 50000 units CAPS capsule Take 50,000 Units by mouth as directed.  . [DISCONTINUED] insulin aspart (NOVOLOG FLEXPEN) 100 UNIT/ML FlexPen Inject 22-28 Units into the skin 3 (three) times daily with meals.  . [DISCONTINUED] Insulin Glargine (BASAGLAR KWIKPEN) 100 UNIT/ML SOPN INJECT 80 UNITS ( 0.8 MLS) SUBQ DAILY AT 10 PM   No facility-administered encounter medications on file as of 12/02/2017.    ALLERGIES: Allergies  Allergen Reactions  . Biaxin [Clarithromycin] Hives   VACCINATION STATUS:  There is no immunization history on file for this patient.  Diabetes  She presents for her follow-up diabetic  visit. She has type 2 diabetes mellitus. Onset time: She was diagnosed at approximate age of 42 years. Her disease course has been improving. There are no hypoglycemic associated symptoms. Pertinent negatives for hypoglycemia include no confusion, headaches, pallor or seizures. Associated symptoms include blurred vision, fatigue, polydipsia and polyuria. Pertinent negatives for diabetes include no chest pain and no polyphagia. There are no hypoglycemic complications. Symptoms are worsening. Risk factors for coronary artery disease include diabetes mellitus, dyslipidemia, family history, hypertension, obesity and sedentary lifestyle. Current diabetic treatment includes insulin injections and oral agent (dual therapy). Her weight is stable. She is following a generally unhealthy diet. When asked about meal  planning, she reported none. She has had a previous visit with a dietitian. She never participates in exercise. Her home blood glucose trend is increasing steadily. Her breakfast blood glucose range is generally 180-200 mg/dl. Her lunch blood glucose range is generally 180-200 mg/dl. Her dinner blood glucose range is generally 180-200 mg/dl. Her bedtime blood glucose range is generally 180-200 mg/dl. Her overall blood glucose range is 180-200 mg/dl. An ACE inhibitor/angiotensin II receptor blocker is being taken. She sees a podiatrist.Eye exam is current.  Hyperlipidemia  This is a chronic problem. The current episode started more than 1 year ago. The problem is controlled. Exacerbating diseases include diabetes and obesity. Pertinent negatives include no chest pain, myalgias or shortness of breath. Current antihyperlipidemic treatment includes statins. Risk factors for coronary artery disease include diabetes mellitus, dyslipidemia, family history, obesity, hypertension and a sedentary lifestyle.  Hypertension  This is a chronic problem. The current episode started more than 1 year ago. Associated symptoms include blurred vision. Pertinent negatives include no chest pain, headaches, palpitations or shortness of breath. Risk factors for coronary artery disease include dyslipidemia, diabetes mellitus, family history, obesity and sedentary lifestyle. Past treatments include angiotensin blockers.     Review of Systems  Constitutional: Positive for fatigue. Negative for activity change, chills, fever and unexpected weight change.  HENT: Negative for trouble swallowing and voice change.   Eyes: Positive for blurred vision. Negative for visual disturbance.  Respiratory: Negative for cough, shortness of breath and wheezing.   Cardiovascular: Negative for chest pain, palpitations and leg swelling.  Gastrointestinal: Negative for diarrhea, nausea and vomiting.  Endocrine: Positive for polydipsia and polyuria.  Negative for cold intolerance, heat intolerance and polyphagia.  Genitourinary: Positive for frequency. Negative for dysuria and flank pain.  Musculoskeletal: Negative for arthralgias and myalgias.  Skin: Negative for color change, pallor, rash and wound.  Neurological: Negative for seizures and headaches.  Psychiatric/Behavioral: Negative for confusion and suicidal ideas.    Objective:    BP 136/70   Pulse 76   Ht '5\' 5"'  (1.651 m)   Wt 246 lb (111.6 kg)   BMI 40.94 kg/m   Wt Readings from Last 3 Encounters:  12/02/17 246 lb (111.6 kg)  08/31/17 249 lb (112.9 kg)  05/28/17 247 lb (112 kg)    Physical Exam  Constitutional: She is oriented to person, place, and time. She appears well-developed.  Obese.  HENT:  Head: Normocephalic and atraumatic.  Eyes: EOM are normal.  Neck: Normal range of motion. Neck supple. No tracheal deviation present. No thyromegaly present.  Cardiovascular: Normal rate.  Pulmonary/Chest: Effort normal.  Abdominal: There is no tenderness. There is no guarding.  Musculoskeletal: Normal range of motion. She exhibits no edema.  Neurological: She is alert and oriented to person, place, and time. No cranial nerve deficit. Coordination normal.  Skin: Skin is  warm and dry. No rash noted. No erythema. No pallor.  Psychiatric: She has a normal mood and affect. Judgment normal.    CMP     Component Value Date/Time   NA 138 12/13/2015   K 4.3 12/13/2015   CL 101 11/27/2009 1305   CO2 27 11/27/2009 1305   GLUCOSE 152 (H) 12/04/2009 1027   BUN 22 (A) 11/25/2017   CREATININE 1.3 (A) 11/25/2017   CREATININE 1.02 11/27/2009 1305   CALCIUM 9.3 11/27/2009 1305   GFRNONAA 55 (L) 11/27/2009 1305   GFRAA  11/27/2009 1305    >60        The eGFR has been calculated using the MDRD equation. This calculation has not been validated in all clinical situations. eGFR's persistently <60 mL/min signify possible Chronic Kidney Disease.   Diabetic Labs (most  recent): Lab Results  Component Value Date   HGBA1C 8.9 11/25/2017   HGBA1C 9.4 08/26/2017   HGBA1C 9.5 05/21/2017    Lipid Panel     Component Value Date/Time   CHOL 128 08/26/2017   TRIG 120 08/26/2017   HDL 37 08/26/2017   LDLCALC 67 08/26/2017     Assessment & Plan:   1. Uncontrolled type 2 diabetes mellitus with stage 3 chronic kidney disease, with long-term current use of insulin ([place])  - Patient has currently uncontrolled symptomatic type 2 DM since  72 years of age. - She came with improving but still significantly above target blood glucose profile.    -  Her CGM average shows 221 improving from 254, 76% above target, improving from 82%, 24% within target improving from 17% during her last visit.    -  Patient admits to dietary indiscretion.   Her diabetes is complicated by  CKD, obesity , and sedentary life and patient remains at a high risk for more acute and chronic complications of diabetes which include CAD, CVA, CKD, retinopathy, and neuropathy. These are all discussed in detail with the patient.  - I have counseled the patient on diet management and weight loss, by adopting a carbohydrate restricted/protein rich diet.  -  Suggestion is made for her to avoid simple carbohydrates  from her diet including Cakes, Sweet Desserts / Pastries, Ice Cream, Soda (diet and regular), Sweet Tea, Candies, Chips, Cookies, Store Bought Juices, Alcohol in Excess of  1-2 drinks a day, Artificial Sweeteners, and "Sugar-free" Products. This will help patient to have stable blood glucose profile and potentially avoid unintended weight gain.  - I encouraged the patient to switch to unprocessed or minimally processed complex starch and increased protein intake (animal or plant source), fruits, and vegetables.  - Patient is advised to stick to a routine mealtimes to eat 3 meals  a day and avoid unnecessary snacks ( to snack only to correct hypoglycemia).   - I have approached patient with  the following individualized plan to manage diabetes and patient agrees:   - She still has significant dietary indiscretion.  -  Based on her blood glucose profile and glycemic burden she will continue to need higher dose basal/bolus insulin. -She is approached for better dietary modification.   - I  will increase her Basaglar to 90  units at bedtime, increase her NovoLog to 25 units  3 times a day before meals for pre-meal blood glucose above 90 mg/dL associated with strict monitoring of glucose 4 times a day-before meals and at bedtime. - She is benefiting from  continued glucose monitoring, is advised to wear it at  all times. -Patient is encouraged to call clinic for blood glucose levels less than 70 or above 300 mg /dl.  - I will increase her Januvia to 50 mg p.o. daily with breakfast.   - If her renal function continues to improve she will be reconsidered for low-dose metformin on subsequent visits.  - Patient specific target  A1c;  LDL, HDL, Triglycerides, and  Waist Circumference were discussed in detail.  2) BP/HTN: Her blood pressure is controlled to target.  She is advised to continue her current blood pressure medications including Hyzaar.  3) Lipids/HPL:  Controlled, LDL 52.  She is advised to continue simvastatin 40 mg by mouth daily at bedtime. Side effects and precautions discussed with her. 4)  Weight/Diet: CDE Consult these in progress  , exercise, and detailed carbohydrates information provided.  5) Chronic Care/Health Maintenance:  -Patient is on ARB and Statin medications and encouraged to continue to follow up with Ophthalmology, Podiatrist at least yearly or according to recommendations, and advised to   stay away from smoking. I have recommended yearly flu vaccine and pneumonia vaccination at least every 5 years; moderate intensity exercise for up to 150 minutes weekly; and  sleep for at least 7 hours a day.  - I advised patient to maintain close follow up with Ephriam Jenkins E for primary care needs.  - Time spent with the patient: 25 min, of which >50% was spent in reviewing her blood glucose logs , discussing her hypo- and hyper-glycemic episodes, reviewing her current and  previous labs and insulin doses and developing a plan to avoid hypo- and hyper-glycemia. Please refer to Patient Instructions for Blood Glucose Monitoring and Insulin/Medications Dosing Guide"  in media tab for additional information. Violetta Lavalle Paye participated in the discussions, expressed understanding, and voiced agreement with the above plans.  All questions were answered to her satisfaction. she is encouraged to contact clinic should she have any questions or concerns prior to her return visit.  Follow up plan: - Return in about 3 months (around 03/04/2018) for meter, and logs.  Glade Lloyd, MD Phone: [phone]  Fax: [phone]  This note was partially dictated with voice recognition software. Similar sounding words can be transcribed inadequately or may not  be corrected upon review.  12/02/2017, 2:38 PM

## 2018-01-15 ENCOUNTER — Other Ambulatory Visit: Payer: Self-pay | Admitting: Neurosurgery

## 2018-01-15 DIAGNOSIS — Z981 Arthrodesis status: Secondary | ICD-10-CM

## 2018-01-20 HISTORY — PX: BLEPHAROPLASTY: SUR158

## 2018-01-27 ENCOUNTER — Other Ambulatory Visit: Payer: Self-pay

## 2018-02-03 ENCOUNTER — Ambulatory Visit
Admission: RE | Admit: 2018-02-03 | Discharge: 2018-02-03 | Disposition: A | Discharge status: Home / Self Care | Payer: Medicare Other | Source: Ambulatory Visit | Attending: Neurosurgery | Admitting: Neurosurgery

## 2018-02-03 ENCOUNTER — Other Ambulatory Visit: Payer: Self-pay | Admitting: "Endocrinology

## 2018-02-03 DIAGNOSIS — Z981 Arthrodesis status: Secondary | ICD-10-CM

## 2018-02-16 ENCOUNTER — Other Ambulatory Visit: Payer: Self-pay | Admitting: "Endocrinology

## 2018-03-08 ENCOUNTER — Ambulatory Visit: Payer: Self-pay | Admitting: Nutrition

## 2018-03-08 ENCOUNTER — Ambulatory Visit: Payer: Medicare Other | Admitting: "Endocrinology

## 2018-03-12 ENCOUNTER — Other Ambulatory Visit: Payer: Self-pay | Admitting: "Endocrinology

## 2018-03-31 ENCOUNTER — Encounter: Payer: Self-pay | Admitting: "Endocrinology

## 2018-03-31 LAB — BASIC METABOLIC PANEL
BUN: 16 (ref 4–21)
Creatinine: 1.2 — AB (ref 0.5–1.1)

## 2018-03-31 LAB — LIPID PANEL
Cholesterol: 146 (ref 0–200)
HDL: 54 (ref 35–70)
LDL Cholesterol: 75
Triglycerides: 87 (ref 40–160)

## 2018-03-31 LAB — MICROALBUMIN, URINE: MICROALB UR: 0.7

## 2018-03-31 LAB — HEMOGLOBIN A1C: Hemoglobin A1C: 8.3

## 2018-03-31 LAB — VITAMIN D 25 HYDROXY (VIT D DEFICIENCY, FRACTURES): Vit D, 25-Hydroxy: 25

## 2018-04-04 ENCOUNTER — Ambulatory Visit: Payer: Medicare Other | Admitting: "Endocrinology

## 2018-04-04 ENCOUNTER — Encounter: Payer: Self-pay | Admitting: "Endocrinology

## 2018-04-04 VITALS — BP 130/74 | HR 83 | Ht 65.0 in | Wt 252.0 lb

## 2018-04-04 DIAGNOSIS — E782 Mixed hyperlipidemia: Secondary | ICD-10-CM

## 2018-04-04 DIAGNOSIS — E1122 Type 2 diabetes mellitus with diabetic chronic kidney disease: Secondary | ICD-10-CM | POA: Diagnosis not present

## 2018-04-04 DIAGNOSIS — I10 Essential (primary) hypertension: Secondary | ICD-10-CM

## 2018-04-04 DIAGNOSIS — N183 Chronic kidney disease, stage 3 (moderate): Secondary | ICD-10-CM

## 2018-04-04 DIAGNOSIS — E1165 Type 2 diabetes mellitus with hyperglycemia: Secondary | ICD-10-CM

## 2018-04-04 DIAGNOSIS — IMO0002 Reserved for concepts with insufficient information to code with codable children: Secondary | ICD-10-CM

## 2018-04-04 DIAGNOSIS — Z794 Long term (current) use of insulin: Secondary | ICD-10-CM

## 2018-04-04 MED ORDER — BASAGLAR KWIKPEN 100 UNIT/ML ~~LOC~~ SOPN: PEN_INJECTOR | SUBCUTANEOUS | 2 refills | Status: DC

## 2018-04-04 NOTE — Patient Instructions (Signed)

## 2018-04-04 NOTE — Progress Notes (Signed)
Endocrinology follow-up note   Subjective:    Patient ID: Tiffany Brock, female    DOB: 25-Feb-1946. Patient is being seen in follow-up for management of type 2 diabetes, hyperlipidemia, hypertension. PCP: Thea Alken  Past Medical History:  Diagnosis Date  . Diabetes mellitus, type II ([place])   . Vertigo    Past Surgical History:  Procedure Laterality Date  . ABDOMINAL HYSTERECTOMY    . APPENDECTOMY     Social History   Socioeconomic History  . Marital status: Married    Spouse name: Not on file  . Number of children: Not on file  . Years of education: Not on file  . Highest education level: Not on file  Occupational History  . Not on file  Social Needs  . Financial resource strain: Not on file  . Food insecurity:    Worry: Not on file    Inability: Not on file  . Transportation needs:    Medical: Not on file    Non-medical: Not on file  Tobacco Use  . Smoking status: Never Smoker  . Smokeless tobacco: Never Used  Substance and Sexual Activity  . Alcohol use: No    Frequency: Never  . Drug use: No  . Sexual activity: Not on file  Lifestyle  . Physical activity:    Days per week: Not on file    Minutes per session: Not on file  . Stress: Not on file  Relationships  . Social connections:    Talks on phone: Not on file    Gets together: Not on file    Attends religious service: Not on file    Active member of club or organization: Not on file    Attends meetings of clubs or organizations: Not on file    Relationship status: Not on file  Other Topics Concern  . Not on file  Social History Narrative  . Not on file   Outpatient Encounter Medications as of 04/04/2018  Medication Sig  . B-D ULTRAFINE III SHORT PEN 31G X 8 MM MISC USE 3 TIMES DAILY AS DIRCTED  . Continuous Blood Gluc Sensor (FREESTYLE LIBRE SENSOR SYSTEM) MISC Use one sensor every 10 days.  Marland Kitchen esomeprazole (NEXIUM) 40 MG capsule Take 40 mg by mouth daily.  . insulin aspart (NOVOLOG  FLEXPEN) 100 UNIT/ML FlexPen Inject 25-31 Units into the skin 3 (three) times daily with meals.  . Insulin Glargine (BASAGLAR KWIKPEN) 100 UNIT/ML SOPN INJECT 90 UNITS ( 0.8 MLS) SUBQ DAILY AT 10 PM  . JANUVIA 25 MG tablet TAKE 1 TABLET BY MOUTH EVERY DAY  . KLOR-CON M20 20 MEQ tablet Take 20 mEq by mouth daily.  Marland Kitchen losartan-hydrochlorothiazide (HYZAAR) 50-12.5 MG tablet Take 1 tablet by mouth daily.  Marland Kitchen PARoxetine (PAXIL) 10 MG tablet Take 10 mg by mouth daily.  . simvastatin (ZOCOR) 40 MG tablet Take 40 mg by mouth daily.  . sitaGLIPtin (JANUVIA) 50 MG tablet Take 1 tablet (50 mg total) by mouth daily.  . Vitamin D, Ergocalciferol, (DRISDOL) 50000 units CAPS capsule Take 50,000 Units by mouth once a week.  . [DISCONTINUED] insulin aspart (NOVOLOG FLEXPEN) 100 UNIT/ML FlexPen Inject 25-31 Units into the skin 3 (three) times daily with meals.  . [DISCONTINUED] Insulin Glargine (BASAGLAR KWIKPEN) 100 UNIT/ML SOPN INJECT 90 UNITS ( 0.8 MLS) SUBQ DAILY AT 10 PM  . [DISCONTINUED] Insulin Glargine (BASAGLAR KWIKPEN) 100 UNIT/ML SOPN Inject 0.9 mLs (90 Units total) into the skin at bedtime.   No facility-administered  encounter medications on file as of 04/04/2018.    ALLERGIES: Allergies  Allergen Reactions  . Biaxin [Clarithromycin] Hives   VACCINATION STATUS:  There is no immunization history on file for this patient.  Diabetes  She presents for her follow-up diabetic visit. She has type 2 diabetes mellitus. Onset time: She was diagnosed at approximate age of 58 years. Her disease course has been improving. There are no hypoglycemic associated symptoms. Pertinent negatives for hypoglycemia include no confusion, headaches, pallor or seizures. Associated symptoms include blurred vision, fatigue, polydipsia and polyuria. Pertinent negatives for diabetes include no chest pain and no polyphagia. There are no hypoglycemic complications. Symptoms are improving. Risk factors for coronary artery disease  include diabetes mellitus, dyslipidemia, family history, hypertension, obesity and sedentary lifestyle. Current diabetic treatment includes insulin injections and oral agent (dual therapy). Her weight is stable. She is following a generally unhealthy diet. When asked about meal planning, she reported none. She has had a previous visit with a dietitian. She never participates in exercise. Her home blood glucose trend is increasing steadily. Her breakfast blood glucose range is generally 140-180 mg/dl. Her lunch blood glucose range is generally 140-180 mg/dl. Her dinner blood glucose range is generally 140-180 mg/dl. Her bedtime blood glucose range is generally 140-180 mg/dl. Her overall blood glucose range is 140-180 mg/dl. An ACE inhibitor/angiotensin II receptor blocker is being taken. She sees a podiatrist.Eye exam is current.  Hyperlipidemia  This is a chronic problem. The current episode started more than 1 year ago. The problem is controlled. Exacerbating diseases include diabetes and obesity. Pertinent negatives include no chest pain, myalgias or shortness of breath. Current antihyperlipidemic treatment includes statins. Risk factors for coronary artery disease include diabetes mellitus, dyslipidemia, family history, obesity, hypertension and a sedentary lifestyle.  Hypertension  This is a chronic problem. The current episode started more than 1 year ago. Associated symptoms include blurred vision. Pertinent negatives include no chest pain, headaches, palpitations or shortness of breath. Risk factors for coronary artery disease include dyslipidemia, diabetes mellitus, family history, obesity and sedentary lifestyle. Past treatments include angiotensin blockers.     Review of Systems  Constitutional: Positive for fatigue. Negative for activity change, chills, fever and unexpected weight change.  HENT: Negative for trouble swallowing and voice change.   Eyes: Positive for blurred vision. Negative for  visual disturbance.  Respiratory: Negative for cough, shortness of breath and wheezing.   Cardiovascular: Negative for chest pain, palpitations and leg swelling.  Gastrointestinal: Negative for diarrhea, nausea and vomiting.  Endocrine: Positive for polydipsia and polyuria. Negative for cold intolerance, heat intolerance and polyphagia.  Genitourinary: Positive for frequency. Negative for dysuria and flank pain.  Musculoskeletal: Negative for arthralgias and myalgias.  Skin: Negative for color change, pallor, rash and wound.  Neurological: Negative for seizures and headaches.  Psychiatric/Behavioral: Negative for confusion and suicidal ideas.    Objective:    BP 130/74   Pulse 83   Ht '5\' 5"'  (1.651 m)   Wt 252 lb (114.3 kg)   BMI 41.93 kg/m   Wt Readings from Last 3 Encounters:  04/04/18 252 lb (114.3 kg)  12/02/17 246 lb (111.6 kg)  08/31/17 249 lb (112.9 kg)    Physical Exam  Constitutional: She is oriented to person, place, and time. She appears well-developed.  Obese.  HENT:  Head: Normocephalic and atraumatic.  Eyes: EOM are normal.  Neck: Normal range of motion. Neck supple. No tracheal deviation present. No thyromegaly present.  Cardiovascular: Normal rate.  Pulmonary/Chest:  Effort normal.  Abdominal: There is no tenderness. There is no guarding.  Musculoskeletal: Normal range of motion. She exhibits no edema.  Neurological: She is alert and oriented to person, place, and time. No cranial nerve deficit. Coordination normal.  Skin: Skin is warm and dry. No rash noted. No erythema. No pallor.  Psychiatric: She has a normal mood and affect. Judgment normal.    CMP     Component Value Date/Time   NA 138 12/13/2015   K 4.3 12/13/2015   CL 101 11/27/2009 1305   CO2 27 11/27/2009 1305   GLUCOSE 152 (H) 12/04/2009 1027   BUN 22 (A) 11/25/2017   CREATININE 1.3 (A) 11/25/2017   CREATININE 1.02 11/27/2009 1305   CALCIUM 9.3 11/27/2009 1305   GFRNONAA 55 (L) 11/27/2009  1305   GFRAA  11/27/2009 1305    >60        The eGFR has been calculated using the MDRD equation. This calculation has not been validated in all clinical situations. eGFR's persistently <60 mL/min signify possible Chronic Kidney Disease.   Diabetic Labs (most recent): Lab Results  Component Value Date   HGBA1C 8.9 11/25/2017   HGBA1C 9.4 08/26/2017   HGBA1C 9.5 05/21/2017    Lipid Panel     Component Value Date/Time   CHOL 128 08/26/2017   TRIG 120 08/26/2017   HDL 37 08/26/2017   LDLCALC 67 08/26/2017     Assessment & Plan:   1. Uncontrolled type 2 diabetes mellitus with stage 3 chronic kidney disease, with long-term current use of insulin ([place])  - Patient has currently uncontrolled symptomatic type 2 DM since  72 years of age. - She came with improving but still significantly above target blood glucose profile.   -Her A1c is 8.3%, progressively improving from 9.5%.     Her diabetes is complicated by  CKD, obesity , and sedentary life and patient remains at a high risk for more acute and chronic complications of diabetes which include CAD, CVA, CKD, retinopathy, and neuropathy. These are all discussed in detail with the patient.  - I have counseled the patient on diet management and weight loss, by adopting a carbohydrate restricted/protein rich diet.  -  Patient admits to dietary indiscretion, including consumption of sweetened beverages.  -  Suggestion is made for her to avoid simple carbohydrates  from her diet including Cakes, Sweet Desserts / Pastries, Ice Cream, Soda (diet and regular), Sweet Tea, Candies, Chips, Cookies, Store Bought Juices, Alcohol in Excess of  1-2 drinks a day, Artificial Sweeteners, and "Sugar-free" Products. This will help patient to have stable blood glucose profile and potentially avoid unintended weight gain.   - I encouraged the patient to switch to unprocessed or minimally processed complex starch and increased protein intake (animal or  plant source), fruits, and vegetables.  - Patient is advised to stick to a routine mealtimes to eat 3 meals  a day and avoid unnecessary snacks ( to snack only to correct hypoglycemia).   - I have approached patient with the following individualized plan to manage diabetes and patient agrees:   -  Based on her blood glucose profile and glycemic burden she will continue to need intensive treatment with basal/bolus insulin in order for her to maintain control of diabetes to target.    - She is approached for better dietary engagement.  She is advised to continue Basaglar 90  units at bedtime, continue NovoLog 25  units  3 times a day before meals for  pre-meal blood glucose above 90 mg/dL associated with strict monitoring of glucose 4 times a day-before meals and at bedtime. - She is benefiting from  continued glucose monitoring, is advised to wear it at all times. -Patient is encouraged to call clinic for blood glucose levels less than 70 or above 300 mg /dl.  -She is advised to continue Januvia 50 mg p.o. daily with breakfast.     - If her renal function continues to improve she will be reconsidered for low-dose metformin on subsequent visits.  - Patient specific target  A1c;  LDL, HDL, Triglycerides, and  Waist Circumference were discussed in detail.  2) BP/HTN: Her blood pressure is controlled to target.   She is advised to continue her current blood pressure medications including Hyzaar.  3) Lipids/HPL: Her recent lipid panel showed controlled LDL at 67.  She is  advised to continue simvastatin 40 mg p.o. nightly.  Side effects and precautions discussed with her.   4)  Weight/Diet: CDE Consult these in progress  , exercise, and detailed carbohydrates information provided.  5) Chronic Care/Health Maintenance:  -Patient is on ARB and Statin medications and encouraged to continue to follow up with Ophthalmology, Podiatrist at least yearly or according to recommendations, and advised to    stay away from smoking. I have recommended yearly flu vaccine and pneumonia vaccination at least every 5 years; moderate intensity exercise for up to 150 minutes weekly; and  sleep for at least 7 hours a day.  - I advised patient to maintain close follow up with Ephriam Jenkins E for primary care needs.  - Time spent with the patient: 25 min, of which >50% was spent in reviewing her blood glucose logs , discussing her hypo- and hyper-glycemic episodes, reviewing her current and  previous labs and insulin doses and developing a plan to avoid hypo- and hyper-glycemia. Please refer to Patient Instructions for Blood Glucose Monitoring and Insulin/Medications Dosing Guide"  in media tab for additional information. Yuvia Plant Winfield participated in the discussions, expressed understanding, and voiced agreement with the above plans.  All questions were answered to her satisfaction. she is encouraged to contact clinic should she have any questions or concerns prior to her return visit.   Follow up plan: - Return in about 3 months (around 07/05/2018) for Meter, and Logs.  Glade Lloyd, MD Phone: [phone]  Fax: [phone]  This note was partially dictated with voice recognition software. Similar sounding words can be transcribed inadequately or may not  be corrected upon review.  04/04/2018, 1:15 PM

## 2018-05-26 ENCOUNTER — Other Ambulatory Visit: Payer: Self-pay

## 2018-05-26 MED ORDER — SITAGLIPTIN PHOSPHATE 25 MG PO TABS: 25.0000 mg | ORAL_TABLET | Freq: Every day | ORAL | 0 refills | Status: DC

## 2018-05-27 ENCOUNTER — Other Ambulatory Visit: Payer: Self-pay | Admitting: "Endocrinology

## 2018-05-31 ENCOUNTER — Other Ambulatory Visit: Payer: Self-pay | Admitting: Neurosurgery

## 2018-06-09 ENCOUNTER — Other Ambulatory Visit: Payer: Self-pay | Admitting: "Endocrinology

## 2018-06-30 LAB — HEMOGLOBIN A1C: Hemoglobin A1C: 8.6

## 2018-06-30 LAB — BASIC METABOLIC PANEL
BUN: 18 (ref 4–21)
Creatinine: 1.2 — AB (ref 0.5–1.1)

## 2018-07-01 NOTE — Pre-Procedure Instructions (Signed)
Tiffany DillingJudith G Brock  07/01/2018      [id number]- [place], [place] - [address] [facility] [address] [place] [id number] Phone: [phone] Fax: [phone]  [facility] - [place], [place] - [address] [place] [address] Phone: [phone] Fax: [phone]    Your procedure is scheduled on 07/11/2018.  [name]  Call this number if you have problems the morning of surgery:  [phone]   Remember:  Do not eat or drink after midnight.    Take these medicines the morning of surgery with A SIP OF WATER: Albuterol (proventil; Ventolin) - if needed (Bring with you to the hospital) Esomeprazole (Nexium) Paroxetine (Paxil)  7 days prior to surgery STOP taking any diclofenac (Voltaren), Aspirin (unless otherwise instructed by your surgeon), Aleve, Naproxen, Ibuprofen, Motrin, Advil, Goody's, BC's, all herbal medications, fish oil, and all vitamins.   WHAT DO I DO ABOUT MY DIABETES MEDICATION?  Marland Kitchen. Do not take oral diabetes medicines (pills) the morning of surgery. - Do NOT take your januvia the morning of your surgery.  . THE NIGHT BEFORE SURGERY, take 35 units of glargine insulin (Basaglar).      . THE MORNING OF SURGERY, do not take insulin.  . If your CBG is greater than 220 mg/dL, you may take  of your sliding scale (correction) dose of insulin.   How to Manage Your Diabetes Before and After Surgery  Why is it important to control my blood sugar before and after surgery? . Improving blood sugar levels before and after surgery helps healing and can limit problems. . A way of improving blood sugar control is eating a healthy diet by: o  Eating less sugar and carbohydrates o  Increasing activity/exercise o  Talking with your doctor about reaching your blood sugar goals . High blood sugars (greater than 180 mg/dL) can raise your risk of  infections and slow your recovery, so you will need to focus on controlling your diabetes during the weeks before surgery. . Make sure that the doctor who takes care of your diabetes knows about your planned surgery including the date and location.  How do I manage my blood sugar before surgery? . Check your blood sugar at least 4 times a day, starting 2 days before surgery, to make sure that the level is not too high or low. o Check your blood sugar the morning of your surgery when you wake up and every 2 hours until you get to the Short Stay unit. . If your blood sugar is less than 70 mg/dL, you will need to treat for low blood sugar: o Do not take insulin. o Treat a low blood sugar (less than 70 mg/dL) with  cup of clear juice (cranberry or apple), 4 glucose tablets, OR glucose gel. o Recheck blood sugar in 15 minutes after treatment (to make sure it is greater than 70 mg/dL). If your blood sugar is not greater than 70 mg/dL on recheck, call [phone] for further instructions. . Report your blood sugar to the short stay nurse when you get to Short Stay.  . If you are admitted to the hospital after surgery: o Your blood sugar will be checked by the staff and you will probably be given insulin after surgery (instead of oral diabetes medicines) to make sure you have good blood sugar levels. o The goal for blood sugar  control after surgery is 80-180 mg/dL.      Do not wear jewelry, make-up or nail polish.  Do not wear lotions, powders, or perfumes, or deodorant.  Do not shave 48 hours prior to surgery.  Men may shave face and neck.  Do not bring valuables to the hospital.  [facility] is not responsible for any belongings or valuables.  Contacts, eyeglasses, hearing aids, dentures or bridgework may not be worn into surgery.  Leave your suitcase in the car.  After surgery it may be brought to your room.  For patients admitted to the hospital, discharge time will be determined by your  treatment team.  Patients discharged the day of surgery will not be allowed to drive home.   Name and phone number of your driver:    Special instructions:   [place]- Preparing For Surgery  Before surgery, you can play an important role. Because skin is not sterile, your skin needs to be as free of germs as possible. You can reduce the number of germs on your skin by washing with CHG (chlorahexidine gluconate) Soap before surgery.  CHG is an antiseptic cleaner which kills germs and bonds with the skin to continue killing germs even after washing.    Oral Hygiene is also important to reduce your risk of infection.  Remember - BRUSH YOUR TEETH THE MORNING OF SURGERY WITH YOUR REGULAR TOOTHPASTE  Please do not use if you have an allergy to CHG or antibacterial soaps. If your skin becomes reddened/irritated stop using the CHG.  Do not shave (including legs and underarms) for at least 48 hours prior to first CHG shower. It is OK to shave your face.  Please follow these instructions carefully.   1. Shower the NIGHT BEFORE SURGERY and the MORNING OF SURGERY with CHG.   2. If you chose to wash your hair, wash your hair first as usual with your normal shampoo.  3. After you shampoo, rinse your hair and body thoroughly to remove the shampoo.  4. Use CHG as you would any other liquid soap. You can apply CHG directly to the skin and wash gently with a scrungie or a clean washcloth.   5. Apply the CHG Soap to your body ONLY FROM THE NECK DOWN.  Do not use on open wounds or open sores. Avoid contact with your eyes, ears, mouth and genitals (private parts). Wash Face and genitals (private parts)  with your normal soap.  6. Wash thoroughly, paying special attention to the area where your surgery will be performed.  7. Thoroughly rinse your body with warm water from the neck down.  8. DO NOT shower/wash with your normal soap after using and rinsing off the CHG Soap.  9. Pat yourself dry with a  CLEAN TOWEL.  10. Wear CLEAN PAJAMAS to bed the night before surgery, wear comfortable clothes the morning of surgery  11. Place CLEAN SHEETS on your bed the night of your first shower and DO NOT SLEEP WITH PETS.    Day of Surgery: Shower as stated above. Do not apply any deodorants/lotions.  Please wear clean clothes to the hospital/surgery center.   Remember to brush your teeth WITH YOUR REGULAR TOOTHPASTE.    Please read over the following fact sheets that you were given.

## 2018-07-04 ENCOUNTER — Encounter (HOSPITAL_COMMUNITY)
Admission: RE | Admit: 2018-07-04 | Discharge: 2018-07-04 | Disposition: A | Discharge status: Home / Self Care | Payer: Medicare Other | Source: Ambulatory Visit | Attending: Neurosurgery | Admitting: Neurosurgery

## 2018-07-04 ENCOUNTER — Encounter (HOSPITAL_COMMUNITY): Payer: Self-pay | Admitting: *Deleted

## 2018-07-04 ENCOUNTER — Inpatient Hospital Stay (HOSPITAL_COMMUNITY)
Admission: RE | Admit: 2018-07-04 | Discharge: 2018-07-04 | Disposition: A | Discharge status: Home / Self Care | Payer: Medicare Other | Source: Ambulatory Visit

## 2018-07-04 DIAGNOSIS — Z01812 Encounter for preprocedural laboratory examination: Secondary | ICD-10-CM | POA: Insufficient documentation

## 2018-07-04 HISTORY — DX: Other specified postprocedural states: Z98.890

## 2018-07-04 HISTORY — DX: Pneumonia, unspecified organism: J18.9

## 2018-07-04 HISTORY — DX: Dyspnea, unspecified: R06.00

## 2018-07-04 HISTORY — DX: Unspecified osteoarthritis, unspecified site: M19.90

## 2018-07-04 HISTORY — DX: Headache, unspecified: R51.9

## 2018-07-04 HISTORY — DX: Headache: R51

## 2018-07-04 HISTORY — DX: Sleep apnea, unspecified: G47.30

## 2018-07-04 HISTORY — DX: Other specified postprocedural states: R11.2

## 2018-07-04 HISTORY — DX: Anemia, unspecified: D64.9

## 2018-07-04 LAB — TYPE AND SCREEN
ABO/RH(D): O POS
Antibody Screen: NEGATIVE

## 2018-07-04 LAB — CBC
HCT: 36.1 % (ref 36.0–46.0)
Hemoglobin: 11.6 g/dL — ABNORMAL LOW (ref 12.0–15.0)
MCH: 28 pg (ref 26.0–34.0)
MCHC: 32.1 g/dL (ref 30.0–36.0)
MCV: 87.2 fL (ref 80.0–100.0)
PLATELETS: 178 10*3/uL (ref 150–400)
RBC: 4.14 MIL/uL (ref 3.87–5.11)
RDW: 13.9 % (ref 11.5–15.5)
WBC: 9.1 10*3/uL (ref 4.0–10.5)
nRBC: 0 % (ref 0.0–0.2)

## 2018-07-04 LAB — BASIC METABOLIC PANEL
Anion gap: 9 (ref 5–15)
BUN: 16 mg/dL (ref 8–23)
CO2: 24 mmol/L (ref 22–32)
Calcium: 8.9 mg/dL (ref 8.9–10.3)
Chloride: 102 mmol/L (ref 98–111)
Creatinine, Ser: 1.3 mg/dL — ABNORMAL HIGH (ref 0.44–1.00)
GFR calc Af Amer: 48 mL/min — ABNORMAL LOW (ref >60)
GFR, EST NON AFRICAN AMERICAN: 41 mL/min — AB (ref >60)
Glucose, Bld: 293 mg/dL — ABNORMAL HIGH (ref 70–99)
Potassium: 4.2 mmol/L (ref 3.5–5.1)
SODIUM: 135 mmol/L (ref 135–145)

## 2018-07-04 LAB — GLUCOSE, CAPILLARY: Glucose-Capillary: 293 mg/dL — ABNORMAL HIGH (ref 70–99)

## 2018-07-04 LAB — HEMOGLOBIN A1C
HEMOGLOBIN A1C: 8.5 % — AB (ref 4.8–5.6)
Mean Plasma Glucose: 197.25 mg/dL

## 2018-07-04 LAB — SURGICAL PCR SCREEN
MRSA, PCR: NEGATIVE
Staphylococcus aureus: NEGATIVE

## 2018-07-04 NOTE — Progress Notes (Signed)
PCP - Richarda Blade in [place], [place] Cardiologist - Dr. Earna Coder in [place] and Dr. Titus Mould in [place] Endocrinologist: Dr. Fransico Him Pulm: Dr. Malachi Pro in [place]-request notes Chest x-ray - na EKG - requested Stress Test - several yrs. Requested from Dr. Earna Coder ECHO - not sure-request Dr. Titus Mould Cardiac Cath - 04/2018  Sleep Study - yrs. ago CPAP - yes  Fasting Blood Sugar - 70-130 Checks Blood Sugar ___4__ times a day   pt. Wears a diabetic sensor  Blood Thinner Instructions:na Aspirin Instructions:na  Anesthesia review: diabetes/cardiac hz.  Patient denies shortness of breath, fever, cough and chest pain at PAT appointment   Patient verbalized understanding of instructions that were given to them at the PAT appointment. Patient was also instructed that they will need to review over the PAT instructions again at home before surgery.

## 2018-07-05 NOTE — Progress Notes (Signed)
Anesthesia Chart Review:  Case:  [id number] Date/Time:  07/11/18 0715   Procedure:  L5-S1 decompression, PLIF, PLA - Possible removal of Affix plate (N/A Back)   Anesthesia type:  General   Pre-op diagnosis:  Stenosis   Location:  [facility] ROOM 20 / [facility]   Surgeon:  Shirlean KellyNudelman, Robert, MD      DISCUSSION: 73 yo female never smoker. Pertinent hx includes IDDMII, HLD, GERD, COPD, OSA on CPAP, LBBB, and HTN.   [facility]. Per Dr. Theron AristaPeter Belford's note 03/31/18 the pt had a stress echo in 2012 that was negative for myocardial ischemia. Due to new dyspnea and strong family hx of CAD the pt was scheduled for right and left cath. Cath was done 04/29/2018 and showed mild multivessel nonobstructive CAD.   Pt was cleared for surgery by Dr. Titus MouldBelford on 07/01/18 stating "We cannot predict MIs. Her prior cath has mild-moderate but non obstructive CAD. Our usual recommendation for these patients is aspirin 81mg  EC daily. There is no good data about cessation, we encourage continuation through surgical events as much as able. As short of a window as they are able is encouraged - but that is up to the . She is not high risk - but I'm not clear to say she is "low risk" (which we say they can stop aspirin completely 10d prior to surgery)."  Pt last seen by pulmonologist Dr. Malachi Prohomas O'Neil on 04/26/18 and his notes indicate she was recovering from a COPD exacerbation at that time.  Anticipate she can proceed as planned barring acute status change.  VS: BP (!) 133/53   Pulse 73   Temp 36.7 C   Resp 20   Ht 5' 5.5" (1.664 m)   Wt 116 kg   SpO2 99%   BMI 41.92 kg/m   PROVIDERS: Richarda BladeElliott, Dianne E is PCP  Karren BurlyBelford, Peter, MD is Cardiologist  Purcell NailsNida, Gebreselassie, MD is Endocrinologist  Malachi Pro'Neil, Thomas, MD is Pulmonologist   LABS: Labs reviewed: Acceptable for surgery. (all labs ordered are listed, but only abnormal results are displayed)  Labs Reviewed  GLUCOSE,  CAPILLARY - Abnormal; Notable for the following components:      Result Value   Glucose-Capillary 293 (*)    All other components within normal limits  HEMOGLOBIN A1C - Abnormal; Notable for the following components:   Hgb A1c MFr Bld 8.5 (*)    All other components within normal limits  CBC - Abnormal; Notable for the following components:   Hemoglobin 11.6 (*)    All other components within normal limits  BASIC METABOLIC PANEL - Abnormal; Notable for the following components:   Glucose, Bld 293 (*)    Creatinine, Ser 1.30 (*)    GFR calc non Af Amer 41 (*)    GFR calc Af Amer 48 (*)    All other components within normal limits  SURGICAL PCR SCREEN  TYPE AND SCREEN     IMAGES: CXR 04/18/2018 (Per Dr. Merlinda Frederick'Neil's notes): No acute infiltrate.  EKG: 04/01/2018 [facility], copy on pt chart): Sinus rhythm with frequent Premature ventricular complexes. Rate 89. Left axis deviation. Left bundle branch block   05/26/2017 [facility], copy on pt chart): Sinus rhythm. LBBB. Rate 69.  CV: R and L Cath 04/29/2018 (care everywhere): RIJ - RHC done with numbers as below  R Radial for coronary angio/left 60F. Radial loop navigated with  angio/glide - consider L radial for future cases.   Mild non obs  CAD. R dom. LVEDP only 17 mmHg  EBL <78mL. No specimens. PreludeSync band for hemostasis to radial site.  Manual pressure to IJ site  Procedural Statistics   Pressures  RA 11 mmHg/2 mmHg/7 mmHg  RV 32 mmHg/8 mmHg/11 mmHg  PA 26 mmHg/9 mmHg/15 mmHg  PCW 16 mmHg/16 mmHg/15 mmHg  LV 146 mmHg/-5 mmHg/17 mmHg  AO 137 mmHg/58 mmHg/88 mmHg    Saturations  SVC Saturation O2-Sat 66  PA Saturation O2-Sat 66  PA Saturation O2-Cont 90.8  AO Saturation O2-Sat 97  AO Saturation O2-cont 134.6    Derived Parameters  SVR 939.1  SVR Index 2049.5    Cardiac function CO (cardiac output) CI (cardiac index)  Thermo 6.9 3.2  Fick 4.6 2.1         Cardiac Output  Fick A-V O2 Diff 43.9 mL/L   Fick O2 Consumption   Fick Predicted O2 Consumption 199.8 mL/min    Ventricular Data  LV Max dp/dt 1218 mmHg/s  LV Max dp/dt/p   RV Max dp/dt 259 mmHg/s  RV Max dp/dt/p    Past Medical History:  Diagnosis Date  . Anemia   . Arthritis   . Diabetes mellitus, type II (HCC)   . Dyspnea    on exertion  . Headache   . Pneumonia    h/o  . PONV (postoperative nausea and vomiting)   . Sleep apnea   . Vertigo     Past Surgical History:  Procedure Laterality Date  . ABDOMINAL HYSTERECTOMY    . APPENDECTOMY    . BLEPHAROPLASTY Bilateral 01/2018  . CARPAL TUNNEL RELEASE Left 2008  . HEMORROIDECTOMY    . UMBILICAL HERNIA REPAIR      MEDICATIONS: . NON FORMULARY  . albuterol (PROVENTIL HFA;VENTOLIN HFA) 108 (90 Base) MCG/ACT inhaler  . B-D ULTRAFINE III SHORT PEN 31G X 8 MM MISC  . Continuous Blood Gluc Sensor (FREESTYLE LIBRE SENSOR SYSTEM) MISC  . diclofenac (VOLTAREN) 75 MG EC tablet  . esomeprazole (NEXIUM) 40 MG capsule  . insulin aspart (NOVOLOG) 100 UNIT/ML FlexPen  . Insulin Glargine (BASAGLAR KWIKPEN) 100 UNIT/ML SOPN  . JANUVIA 50 MG tablet  . losartan-hydrochlorothiazide (HYZAAR) 50-12.5 MG tablet  . PARoxetine (PAXIL) 10 MG tablet  . simvastatin (ZOCOR) 40 MG tablet  . Vitamin D, Ergocalciferol, (DRISDOL) 50000 units CAPS capsule   No current facility-administered medications for this encounter.

## 2018-07-07 ENCOUNTER — Encounter: Payer: Self-pay | Admitting: "Endocrinology

## 2018-07-07 ENCOUNTER — Ambulatory Visit: Payer: Medicare Other | Admitting: "Endocrinology

## 2018-07-07 VITALS — BP 125/78 | HR 71 | Ht 65.0 in | Wt 255.0 lb

## 2018-07-07 DIAGNOSIS — E782 Mixed hyperlipidemia: Secondary | ICD-10-CM

## 2018-07-07 DIAGNOSIS — Z794 Long term (current) use of insulin: Secondary | ICD-10-CM

## 2018-07-07 DIAGNOSIS — E1122 Type 2 diabetes mellitus with diabetic chronic kidney disease: Secondary | ICD-10-CM

## 2018-07-07 DIAGNOSIS — E1165 Type 2 diabetes mellitus with hyperglycemia: Secondary | ICD-10-CM

## 2018-07-07 DIAGNOSIS — I10 Essential (primary) hypertension: Secondary | ICD-10-CM | POA: Diagnosis not present

## 2018-07-07 DIAGNOSIS — N183 Chronic kidney disease, stage 3 (moderate): Secondary | ICD-10-CM

## 2018-07-07 DIAGNOSIS — IMO0002 Reserved for concepts with insufficient information to code with codable children: Secondary | ICD-10-CM

## 2018-07-07 NOTE — Progress Notes (Signed)
Endocrinology follow-up note   Subjective:    Patient ID: Tiffany Brock, female    DOB: 1946/05/10. Patient is being seen in follow-up for management of type 2 diabetes, hyperlipidemia, hypertension. PCP: Garald BraverElliott, Dianne E  Past Medical History:  Diagnosis Date  . Anemia   . Arthritis   . Diabetes mellitus, type II (HCC)   . Dyspnea    on exertion  . Headache   . Pneumonia    h/o  . PONV (postoperative nausea and vomiting)   . Sleep apnea   . Vertigo    Past Surgical History:  Procedure Laterality Date  . ABDOMINAL HYSTERECTOMY    . APPENDECTOMY    . BLEPHAROPLASTY Bilateral 01/2018  . CARPAL TUNNEL RELEASE Left 2008  . HEMORROIDECTOMY    . UMBILICAL HERNIA REPAIR     Social History   Socioeconomic History  . Marital status: Married    Spouse name: Not on file  . Number of children: Not on file  . Years of education: Not on file  . Highest education level: Not on file  Occupational History  . Not on file  Social Needs  . Financial resource strain: Not on file  . Food insecurity:    Worry: Not on file    Inability: Not on file  . Transportation needs:    Medical: Not on file    Non-medical: Not on file  Tobacco Use  . Smoking status: Never Smoker  . Smokeless tobacco: Never Used  Substance and Sexual Activity  . Alcohol use: No    Frequency: Never  . Drug use: No  . Sexual activity: Not on file  Lifestyle  . Physical activity:    Days per week: Not on file    Minutes per session: Not on file  . Stress: Not on file  Relationships  . Social connections:    Talks on phone: Not on file    Gets together: Not on file    Attends religious service: Not on file    Active member of club or organization: Not on file    Attends meetings of clubs or organizations: Not on file    Relationship status: Not on file  Other Topics Concern  . Not on file  Social History Narrative  . Not on file   Outpatient Encounter Medications as of 07/07/2018   Medication Sig  . albuterol (PROVENTIL HFA;VENTOLIN HFA) 108 (90 Base) MCG/ACT inhaler Inhale 2 puffs into the lungs every 6 (six) hours as needed for wheezing or shortness of breath.  . B-D ULTRAFINE III SHORT PEN 31G X 8 MM MISC USE 3 TIMES DAILY AS DIRCTED (Patient not taking: Reported on 06/23/2018)  . Continuous Blood Gluc Sensor (FREESTYLE LIBRE SENSOR SYSTEM) MISC Use one sensor every 10 days.  . diclofenac (VOLTAREN) 75 MG EC tablet Take 75 mg by mouth daily as needed for mild pain.  Marland Kitchen. esomeprazole (NEXIUM) 40 MG capsule Take 40 mg by mouth daily.  . insulin aspart (NOVOLOG) 100 UNIT/ML FlexPen INJECT 25-31 UNITS INTO THE SKIN 3 (THREE) TIMES DAILY WITH MEALS. (Patient taking differently: Inject 1-27 Units into the skin 3 (three) times daily with meals. Per sliding scale)  . Insulin Glargine (BASAGLAR KWIKPEN) 100 UNIT/ML SOPN INJECT 90 UNITS ( 0.8 MLS) SUBQ DAILY AT 10 PM (Patient taking differently: Inject 70 Units into the skin at bedtime. )  . JANUVIA 50 MG tablet TAKE 1 TABLET BY MOUTH EVERY DAY (Patient taking differently: Take 50 mg by  mouth daily. )  . losartan-hydrochlorothiazide (HYZAAR) 50-12.5 MG tablet Take 1 tablet by mouth daily.  . NON FORMULARY CPAP  . PARoxetine (PAXIL) 10 MG tablet Take 10 mg by mouth daily.  . simvastatin (ZOCOR) 40 MG tablet Take 40 mg by mouth daily.  . Vitamin D, Ergocalciferol, (DRISDOL) 50000 units CAPS capsule Take 50,000 Units by mouth every Sunday.    No facility-administered encounter medications on file as of 07/07/2018.    ALLERGIES: Allergies  Allergen Reactions  . Biaxin [Clarithromycin] Hives   VACCINATION STATUS:  There is no immunization history on file for this patient.  Diabetes  She presents for her follow-up diabetic visit. She has type 2 diabetes mellitus. Onset time: She was diagnosed at approximate age of 46 years. Her disease course has been worsening. There are no hypoglycemic associated symptoms. Pertinent negatives for  hypoglycemia include no confusion, headaches, pallor or seizures. Associated symptoms include blurred vision, fatigue, polydipsia and polyuria. Pertinent negatives for diabetes include no chest pain and no polyphagia. There are no hypoglycemic complications. Symptoms are worsening. Risk factors for coronary artery disease include diabetes mellitus, dyslipidemia, family history, hypertension, obesity and sedentary lifestyle. Current diabetic treatment includes insulin injections and oral agent (dual therapy). Her weight is fluctuating minimally. She is following a generally unhealthy diet. When asked about meal planning, she reported none. She has had a previous visit with a dietitian. She never participates in exercise. Her home blood glucose trend is fluctuating minimally. Her breakfast blood glucose range is generally 180-200 mg/dl. Her lunch blood glucose range is generally 180-200 mg/dl. Her dinner blood glucose range is generally 180-200 mg/dl. Her bedtime blood glucose range is generally 180-200 mg/dl. Her overall blood glucose range is 180-200 mg/dl. An ACE inhibitor/angiotensin II receptor blocker is being taken. She sees a podiatrist.Eye exam is current.  Hyperlipidemia  This is a chronic problem. The current episode started more than 1 year ago. The problem is controlled. Exacerbating diseases include diabetes and obesity. Pertinent negatives include no chest pain, myalgias or shortness of breath. Current antihyperlipidemic treatment includes statins. Risk factors for coronary artery disease include diabetes mellitus, dyslipidemia, family history, obesity, hypertension and a sedentary lifestyle.  Hypertension  This is a chronic problem. The current episode started more than 1 year ago. Associated symptoms include blurred vision. Pertinent negatives include no chest pain, headaches, palpitations or shortness of breath. Risk factors for coronary artery disease include dyslipidemia, diabetes mellitus,  family history, obesity and sedentary lifestyle. Past treatments include angiotensin blockers.     Review of Systems  Constitutional: Positive for fatigue. Negative for activity change, chills, fever and unexpected weight change.  HENT: Negative for trouble swallowing and voice change.   Eyes: Positive for blurred vision. Negative for visual disturbance.  Respiratory: Negative for cough, shortness of breath and wheezing.   Cardiovascular: Negative for chest pain, palpitations and leg swelling.  Gastrointestinal: Negative for diarrhea, nausea and vomiting.  Endocrine: Positive for polydipsia and polyuria. Negative for cold intolerance, heat intolerance and polyphagia.  Genitourinary: Positive for frequency. Negative for dysuria and flank pain.  Musculoskeletal: Negative for arthralgias and myalgias.  Skin: Negative for color change, pallor, rash and wound.  Neurological: Negative for seizures and headaches.  Psychiatric/Behavioral: Negative for confusion and suicidal ideas.    Objective:    BP 125/78   Pulse 71   Ht 5\' 5"  (1.651 m)   Wt 255 lb (115.7 kg)   BMI 42.43 kg/m   Wt Readings from Last 3 Encounters:  07/07/18 255 lb (115.7 kg)  07/04/18 255 lb 12.8 oz (116 kg)  04/04/18 252 lb (114.3 kg)    Physical Exam  Constitutional: She is oriented to person, place, and time. She appears well-developed.  Obese.  HENT:  Head: Normocephalic and atraumatic.  Eyes: EOM are normal.  Neck: Normal range of motion. Neck supple. No tracheal deviation present. No thyromegaly present.  Cardiovascular: Normal rate.  Pulmonary/Chest: Effort normal.  Abdominal: There is no abdominal tenderness. There is no guarding.  Musculoskeletal: Normal range of motion.        General: No edema.  Neurological: She is alert and oriented to person, place, and time. No cranial nerve deficit. Coordination normal.  Skin: Skin is warm and dry. No rash noted. No erythema. No pallor.  Psychiatric: She has a  normal mood and affect. Judgment normal.    CMP     Component Value Date/Time   NA 135 07/04/2018 1407   NA 138 12/13/2015   K 4.2 07/04/2018 1407   CL 102 07/04/2018 1407   CO2 24 07/04/2018 1407   GLUCOSE 293 (H) 07/04/2018 1407   BUN 16 07/04/2018 1407   BUN 18 06/30/2018   CREATININE 1.30 (H) 07/04/2018 1407   CALCIUM 8.9 07/04/2018 1407   GFRNONAA 41 (L) 07/04/2018 1407   GFRAA 48 (L) 07/04/2018 1407   Diabetic Labs (most recent): Lab Results  Component Value Date   HGBA1C 8.5 (H) 07/04/2018   HGBA1C 8.6 06/30/2018   HGBA1C 8.3 03/31/2018    Lipid Panel     Component Value Date/Time   CHOL 146 03/31/2018   TRIG 87 03/31/2018   HDL 54 03/31/2018   LDLCALC 75 03/31/2018     Assessment & Plan:   1. Uncontrolled type 2 diabetes mellitus with stage 3 chronic kidney disease, with long-term current use of insulin (HCC)  - Patient has currently uncontrolled symptomatic type 2 DM since  73 years of age. - She came with average blood glucose profile and A1c of 8.5%.      Her diabetes is complicated by  CKD, obesity , and sedentary life and patient remains at a high risk for more acute and chronic complications of diabetes which include CAD, CVA, CKD, retinopathy, and neuropathy. These are all discussed in detail with the patient.  - I have counseled the patient on diet management and weight loss, by adopting a carbohydrate restricted/protein rich diet.  -  Patient admits to dietary indiscretion, including consumption of sweetened beverages.  -  Suggestion is made for her to avoid simple carbohydrates  from her diet including Cakes, Sweet Desserts / Pastries, Ice Cream, Soda (diet and regular), Sweet Tea, Candies, Chips, Cookies, Store Bought Juices, Alcohol in Excess of  1-2 drinks a day, Artificial Sweeteners, and "Sugar-free" Products. This will help patient to have stable blood glucose profile and potentially avoid unintended weight gain.  - I encouraged the patient  to switch to unprocessed or minimally processed complex starch and increased protein intake (animal or plant source), fruits, and vegetables.  - Patient is advised to stick to a routine mealtimes to eat 3 meals  a day and avoid unnecessary snacks ( to snack only to correct hypoglycemia).   - I have approached patient with the following individualized plan to manage diabetes and patient agrees:   -  Based on her blood glucose profile and glycemic burden she will continue to need intensive treatment with basal/bolus insulin in order for her to maintain control of diabetes  to target.    - She is approached for better dietary engagement.  She is advised to continue Basaglar 90 minutes daily at bedtime, continue  NovoLog 25  units  3 times a day before meals for pre-meal blood glucose above 90 mg/dL associated with strict monitoring of glucose 4 times a day-before meals and at bedtime. - She is benefiting from  continued glucose monitoring, is advised to wear it at all times. -Patient is encouraged to call clinic for blood glucose levels less than 70 or above 300 mg /dl.  -She is advised to continue Januvia 50 mg p.o. daily with breakfast.  - If her renal function continues to improve she will be reconsidered for low-dose metformin on subsequent visits.  - Patient specific target  A1c;  LDL, HDL, Triglycerides, and  Waist Circumference were discussed in detail.  2) BP/HTN: Her blood pressure is controlled to target.   She is advised to continue her current blood pressure medications including Hyzaar.  3) Lipids/HPL: Her recent lipid panel showed controlled LDL at 67.  She is  advised to continue simvastatin 40 mg p.o. nightly.   Side effects and precautions discussed with her.   4)  Weight/Diet: CDE Consult these in progress  , exercise, and detailed carbohydrates information provided.  5) Chronic Care/Health Maintenance:  -Patient is on ARB and Statin medications and encouraged to continue to  follow up with Ophthalmology, Podiatrist at least yearly or according to recommendations, and advised to   stay away from smoking. I have recommended yearly flu vaccine and pneumonia vaccination at least every 5 years; moderate intensity exercise for up to 150 minutes weekly; and  sleep for at least 7 hours a day.  - I advised patient to maintain close follow up with Richarda Blade E for primary care needs.  - Time spent with the patient: 25 min, of which >50% was spent in reviewing her blood glucose logs , discussing her hypo- and hyper-glycemic episodes, reviewing her current and  previous labs and insulin doses and developing a plan to avoid hypo- and hyper-glycemia. Please refer to Patient Instructions for Blood Glucose Monitoring and Insulin/Medications Dosing Guide"  in media tab for additional information. Erminie Foulks Selke participated in the discussions, expressed understanding, and voiced agreement with the above plans.  All questions were answered to her satisfaction. she is encouraged to contact clinic should she have any questions or concerns prior to her return visit.  Follow up plan: - Return in about 3 months (around 10/06/2018) for Follow up with Pre-visit Labs, Meter, and Logs.  Marquis Lunch, MD Phone: [phone]  Fax: [phone]  This note was partially dictated with voice recognition software. Similar sounding words can be transcribed inadequately or may not  be corrected upon review.  07/07/2018, 1:46 PM

## 2018-07-07 NOTE — Progress Notes (Signed)
Pt is using the Freestyle Libre 14 day reader/sensors to test BG.

## 2018-07-07 NOTE — Patient Instructions (Signed)

## 2018-07-08 NOTE — Anesthesia Preprocedure Evaluation (Addendum)
Anesthesia Evaluation  Patient identified by MRN, date of birth, ID band Patient awake    Reviewed: Allergy & Precautions, H&P , NPO status , Patient's Chart, lab work & pertinent test results  History of Anesthesia Complications (+) PONV  Airway Mallampati: III  TM Distance: >3 FB Neck ROM: Full    Dental no notable dental hx. (+) Teeth Intact, Dental Advisory Given   Pulmonary neg pulmonary ROS, sleep apnea and Continuous Positive Airway Pressure Ventilation ,    Pulmonary exam normal breath sounds clear to auscultation       Cardiovascular Exercise Tolerance: Good hypertension, Pt. on medications  Rhythm:Regular Rate:Normal     Neuro/Psych  Headaches, negative psych ROS   GI/Hepatic negative GI ROS, Neg liver ROS,   Endo/Other  diabetes, Insulin Dependent  Renal/GU Renal InsufficiencyRenal disease  negative genitourinary   Musculoskeletal  (+) Arthritis , Osteoarthritis,    Abdominal   Peds  Hematology  (+) Blood dyscrasia, anemia ,   Anesthesia Other Findings   Reproductive/Obstetrics negative OB ROS                           Anesthesia Physical Anesthesia Plan  ASA: III  Anesthesia Plan: General   Post-op Pain Management:    Induction: Intravenous  PONV Risk Score and Plan: 4 or greater and Ondansetron, Dexamethasone, Midazolam and Scopolamine patch - Pre-op  Airway Management Planned: Oral ETT  Additional Equipment:   Intra-op Plan:   Post-operative Plan: Extubation in OR  Informed Consent: I have reviewed the patients History and Physical, chart, labs and discussed the procedure including the risks, benefits and alternatives for the proposed anesthesia with the patient or authorized representative who has indicated his/her understanding and acceptance.     Dental advisory given  Plan Discussed with: CRNA  Anesthesia Plan Comments: (See PAT note by Antionette Poles,  PA-C )      Anesthesia Quick Evaluation

## 2018-07-11 ENCOUNTER — Encounter (HOSPITAL_COMMUNITY)
Admission: RE | Disposition: A | Discharge status: Home / Self Care | Payer: Self-pay | Source: Home / Self Care | Attending: Neurosurgery

## 2018-07-11 ENCOUNTER — Inpatient Hospital Stay (HOSPITAL_COMMUNITY): Payer: Medicare Other | Admitting: Vascular Surgery

## 2018-07-11 ENCOUNTER — Inpatient Hospital Stay (HOSPITAL_COMMUNITY)
Admission: RE | Admit: 2018-07-11 | Discharge: 2018-07-12 | DRG: 454 | Disposition: A | Discharge status: Home / Self Care | Payer: Medicare Other | Attending: Neurosurgery | Admitting: Neurosurgery

## 2018-07-11 ENCOUNTER — Encounter (HOSPITAL_COMMUNITY): Payer: Self-pay

## 2018-07-11 ENCOUNTER — Inpatient Hospital Stay (HOSPITAL_COMMUNITY): Payer: Medicare Other

## 2018-07-11 ENCOUNTER — Other Ambulatory Visit: Payer: Self-pay | Admitting: "Endocrinology

## 2018-07-11 ENCOUNTER — Other Ambulatory Visit: Payer: Self-pay

## 2018-07-11 DIAGNOSIS — E782 Mixed hyperlipidemia: Secondary | ICD-10-CM | POA: Diagnosis present

## 2018-07-11 DIAGNOSIS — M47816 Spondylosis without myelopathy or radiculopathy, lumbar region: Secondary | ICD-10-CM | POA: Diagnosis present

## 2018-07-11 DIAGNOSIS — Z881 Allergy status to other antibiotic agents status: Secondary | ICD-10-CM | POA: Diagnosis not present

## 2018-07-11 DIAGNOSIS — M48062 Spinal stenosis, lumbar region with neurogenic claudication: Secondary | ICD-10-CM | POA: Diagnosis present

## 2018-07-11 DIAGNOSIS — N183 Chronic kidney disease, stage 3 (moderate): Secondary | ICD-10-CM | POA: Diagnosis present

## 2018-07-11 DIAGNOSIS — E1122 Type 2 diabetes mellitus with diabetic chronic kidney disease: Secondary | ICD-10-CM | POA: Diagnosis present

## 2018-07-11 DIAGNOSIS — Z79899 Other long term (current) drug therapy: Secondary | ICD-10-CM | POA: Diagnosis not present

## 2018-07-11 DIAGNOSIS — G4730 Sleep apnea, unspecified: Secondary | ICD-10-CM | POA: Diagnosis present

## 2018-07-11 DIAGNOSIS — Z981 Arthrodesis status: Secondary | ICD-10-CM

## 2018-07-11 DIAGNOSIS — M5137 Other intervertebral disc degeneration, lumbosacral region: Secondary | ICD-10-CM | POA: Diagnosis present

## 2018-07-11 DIAGNOSIS — Z6841 Body Mass Index (BMI) 40.0 and over, adult: Secondary | ICD-10-CM | POA: Diagnosis not present

## 2018-07-11 DIAGNOSIS — I129 Hypertensive chronic kidney disease with stage 1 through stage 4 chronic kidney disease, or unspecified chronic kidney disease: Secondary | ICD-10-CM | POA: Diagnosis present

## 2018-07-11 DIAGNOSIS — M4807 Spinal stenosis, lumbosacral region: Secondary | ICD-10-CM | POA: Diagnosis present

## 2018-07-11 DIAGNOSIS — Z794 Long term (current) use of insulin: Secondary | ICD-10-CM | POA: Diagnosis not present

## 2018-07-11 DIAGNOSIS — Z419 Encounter for procedure for purposes other than remedying health state, unspecified: Secondary | ICD-10-CM

## 2018-07-11 LAB — GLUCOSE, CAPILLARY
Glucose-Capillary: 191 mg/dL — ABNORMAL HIGH (ref 70–99)
Glucose-Capillary: 165 mg/dL — ABNORMAL HIGH (ref 70–99)
Glucose-Capillary: 229 mg/dL — ABNORMAL HIGH (ref 70–99)
Glucose-Capillary: 282 mg/dL — ABNORMAL HIGH (ref 70–99)
Glucose-Capillary: 261 mg/dL — ABNORMAL HIGH (ref 70–99)
Glucose-Capillary: 289 mg/dL — ABNORMAL HIGH (ref 70–99)

## 2018-07-11 SURGERY — POSTERIOR LUMBAR FUSION 1 WITH HARDWARE REMOVAL
Anesthesia: General | Site: Spine Lumbar

## 2018-07-11 MED ORDER — INSULIN ASPART 100 UNIT/ML ~~LOC~~ SOLN
0.0000 [IU] | Freq: Three times a day (TID) | SUBCUTANEOUS | Status: DC
  Administered 2018-07-12: 7 [IU] via SUBCUTANEOUS
  Administered 2018-07-12: 4 [IU] via SUBCUTANEOUS

## 2018-07-11 MED ORDER — ALBUTEROL SULFATE (2.5 MG/3ML) 0.083% IN NEBU: 2.5000 mg | INHALATION_SOLUTION | Freq: Four times a day (QID) | RESPIRATORY_TRACT | Status: DC | PRN

## 2018-07-11 MED ORDER — SODIUM CHLORIDE 0.9 % IV SOLN: 250.0000 mL | INTRAVENOUS | Status: DC

## 2018-07-11 MED ORDER — THROMBIN 20000 UNITS EX SOLR
CUTANEOUS | Status: AC
  Filled 2018-07-11: qty 20000

## 2018-07-11 MED ORDER — CHLORHEXIDINE GLUCONATE CLOTH 2 % EX PADS: 6.0000 | MEDICATED_PAD | Freq: Once | CUTANEOUS | Status: DC

## 2018-07-11 MED ORDER — MENTHOL 3 MG MT LOZG: 1.0000 | LOZENGE | OROMUCOSAL | Status: DC | PRN

## 2018-07-11 MED ORDER — MORPHINE SULFATE (PF) 4 MG/ML IV SOLN: 4.0000 mg | INTRAVENOUS | Status: DC | PRN

## 2018-07-11 MED ORDER — FENTANYL CITRATE (PF) 100 MCG/2ML IJ SOLN
INTRAMUSCULAR | Status: DC | PRN
  Administered 2018-07-11 (×3): 25 ug via INTRAVENOUS
  Administered 2018-07-11: 50 ug via INTRAVENOUS
  Administered 2018-07-11: 100 ug via INTRAVENOUS
  Administered 2018-07-11: 25 ug via INTRAVENOUS
  Administered 2018-07-11 (×3): 50 ug via INTRAVENOUS

## 2018-07-11 MED ORDER — PROPOFOL 10 MG/ML IV BOLUS
INTRAVENOUS | Status: DC | PRN
  Administered 2018-07-11: 140 mg via INTRAVENOUS

## 2018-07-11 MED ORDER — HYDROCHLOROTHIAZIDE 12.5 MG PO CAPS: 12.5000 mg | ORAL_CAPSULE | Freq: Every day | ORAL | Status: DC

## 2018-07-11 MED ORDER — SODIUM CHLORIDE 0.9% FLUSH: 3.0000 mL | INTRAVENOUS | Status: DC | PRN

## 2018-07-11 MED ORDER — PHENOL 1.4 % MT LIQD: 1.0000 | OROMUCOSAL | Status: DC | PRN

## 2018-07-11 MED ORDER — SODIUM CHLORIDE 0.9 % IV SOLN
INTRAVENOUS | Status: DC | PRN
  Administered 2018-07-11: 15 ug/min via INTRAVENOUS

## 2018-07-11 MED ORDER — ACETAMINOPHEN 10 MG/ML IV SOLN
INTRAVENOUS | Status: AC
  Filled 2018-07-11: qty 100

## 2018-07-11 MED ORDER — ACETAMINOPHEN 325 MG PO TABS: 650.0000 mg | ORAL_TABLET | ORAL | Status: DC | PRN

## 2018-07-11 MED ORDER — POTASSIUM CHLORIDE IN NACL 20-0.9 MEQ/L-% IV SOLN: INTRAVENOUS | Status: DC

## 2018-07-11 MED ORDER — SUGAMMADEX SODIUM 200 MG/2ML IV SOLN
INTRAVENOUS | Status: DC | PRN
  Administered 2018-07-11: 250 mg via INTRAVENOUS

## 2018-07-11 MED ORDER — BISACODYL 10 MG RE SUPP: 10.0000 mg | Freq: Every day | RECTAL | Status: DC | PRN

## 2018-07-11 MED ORDER — ONDANSETRON HCL 4 MG/2ML IJ SOLN
INTRAMUSCULAR | Status: AC
  Filled 2018-07-11: qty 2

## 2018-07-11 MED ORDER — ALBUMIN HUMAN 5 % IV SOLN
INTRAVENOUS | Status: DC | PRN
  Administered 2018-07-11: 10:00:00 via INTRAVENOUS

## 2018-07-11 MED ORDER — PROPOFOL 1000 MG/100ML IV EMUL
INTRAVENOUS | Status: AC
  Filled 2018-07-11: qty 100

## 2018-07-11 MED ORDER — SUCCINYLCHOLINE CHLORIDE 200 MG/10ML IV SOSY
PREFILLED_SYRINGE | INTRAVENOUS | Status: AC
  Filled 2018-07-11: qty 10

## 2018-07-11 MED ORDER — MIDAZOLAM HCL 2 MG/2ML IJ SOLN
INTRAMUSCULAR | Status: AC
  Filled 2018-07-11: qty 2

## 2018-07-11 MED ORDER — INSULIN GLARGINE 100 UNIT/ML ~~LOC~~ SOLN
70.0000 [IU] | Freq: Every day | SUBCUTANEOUS | Status: DC
  Filled 2018-07-11 (×2): qty 0.7

## 2018-07-11 MED ORDER — BUPIVACAINE HCL (PF) 0.5 % IJ SOLN
INTRAMUSCULAR | Status: DC | PRN
  Administered 2018-07-11: 15 mL

## 2018-07-11 MED ORDER — FLEET ENEMA 7-19 GM/118ML RE ENEM: 1.0000 | ENEMA | Freq: Once | RECTAL | Status: DC | PRN

## 2018-07-11 MED ORDER — HYDROMORPHONE HCL 1 MG/ML IJ SOLN
0.2500 mg | INTRAMUSCULAR | Status: DC | PRN
  Administered 2018-07-11 (×2): 0.5 mg via INTRAVENOUS

## 2018-07-11 MED ORDER — CEFAZOLIN SODIUM-DEXTROSE 2-4 GM/100ML-% IV SOLN
2.0000 g | INTRAVENOUS | Status: AC
  Administered 2018-07-11 (×2): 2 g via INTRAVENOUS

## 2018-07-11 MED ORDER — HYDROXYZINE HCL 25 MG PO TABS
50.0000 mg | ORAL_TABLET | ORAL | Status: DC | PRN
  Administered 2018-07-12: 50 mg via ORAL
  Filled 2018-07-11: qty 2

## 2018-07-11 MED ORDER — ROCURONIUM BROMIDE 50 MG/5ML IV SOSY
PREFILLED_SYRINGE | INTRAVENOUS | Status: DC | PRN
  Administered 2018-07-11: 20 mg via INTRAVENOUS
  Administered 2018-07-11: 50 mg via INTRAVENOUS
  Administered 2018-07-11 (×2): 20 mg via INTRAVENOUS

## 2018-07-11 MED ORDER — ACETAMINOPHEN 10 MG/ML IV SOLN
INTRAVENOUS | Status: DC | PRN
  Administered 2018-07-11: 1000 mg via INTRAVENOUS

## 2018-07-11 MED ORDER — SODIUM CHLORIDE 0.9% FLUSH: 3.0000 mL | Freq: Two times a day (BID) | INTRAVENOUS | Status: DC

## 2018-07-11 MED ORDER — ARTIFICIAL TEARS OPHTHALMIC OINT
TOPICAL_OINTMENT | OPHTHALMIC | Status: DC | PRN
  Administered 2018-07-11: 1 via OPHTHALMIC

## 2018-07-11 MED ORDER — FENTANYL CITRATE (PF) 250 MCG/5ML IJ SOLN
INTRAMUSCULAR | Status: AC
  Filled 2018-07-11: qty 5

## 2018-07-11 MED ORDER — MAGNESIUM HYDROXIDE 400 MG/5ML PO SUSP: 30.0000 mL | Freq: Every day | ORAL | Status: DC | PRN

## 2018-07-11 MED ORDER — LOSARTAN POTASSIUM 50 MG PO TABS: 50.0000 mg | ORAL_TABLET | Freq: Every day | ORAL | Status: DC

## 2018-07-11 MED ORDER — INSULIN ASPART 100 UNIT/ML ~~LOC~~ SOLN: 0.0000 [IU] | Freq: Every day | SUBCUTANEOUS | Status: DC

## 2018-07-11 MED ORDER — THROMBIN 5000 UNITS EX SOLR
CUTANEOUS | Status: AC
  Filled 2018-07-11: qty 5000

## 2018-07-11 MED ORDER — LIDOCAINE 2% (20 MG/ML) 5 ML SYRINGE
INTRAMUSCULAR | Status: AC
  Filled 2018-07-11: qty 5

## 2018-07-11 MED ORDER — SODIUM CHLORIDE 0.9 % IV SOLN
INTRAVENOUS | Status: DC | PRN
  Administered 2018-07-11: 09:00:00

## 2018-07-11 MED ORDER — DEXAMETHASONE SODIUM PHOSPHATE 10 MG/ML IJ SOLN
INTRAMUSCULAR | Status: AC
  Filled 2018-07-11: qty 1

## 2018-07-11 MED ORDER — ONDANSETRON HCL 4 MG/2ML IJ SOLN
INTRAMUSCULAR | Status: DC | PRN
  Administered 2018-07-11: 4 mg via INTRAVENOUS

## 2018-07-11 MED ORDER — LACTATED RINGERS IV SOLN
INTRAVENOUS | Status: DC | PRN
  Administered 2018-07-11 (×3): via INTRAVENOUS

## 2018-07-11 MED ORDER — PAROXETINE HCL 10 MG PO TABS
10.0000 mg | ORAL_TABLET | Freq: Every day | ORAL | Status: DC
  Administered 2018-07-12: 10 mg via ORAL
  Filled 2018-07-11: qty 1

## 2018-07-11 MED ORDER — DEXAMETHASONE SODIUM PHOSPHATE 10 MG/ML IJ SOLN
INTRAMUSCULAR | Status: DC | PRN
  Administered 2018-07-11: 5 mg via INTRAVENOUS

## 2018-07-11 MED ORDER — SCOPOLAMINE 1 MG/3DAYS TD PT72
MEDICATED_PATCH | TRANSDERMAL | Status: DC | PRN
  Administered 2018-07-11: 1 via TRANSDERMAL

## 2018-07-11 MED ORDER — LIDOCAINE 2% (20 MG/ML) 5 ML SYRINGE
INTRAMUSCULAR | Status: DC | PRN
  Administered 2018-07-11: 60 mg via INTRAVENOUS

## 2018-07-11 MED ORDER — INSULIN ASPART 100 UNIT/ML ~~LOC~~ SOLN
0.0000 [IU] | Freq: Three times a day (TID) | SUBCUTANEOUS | Status: DC
  Administered 2018-07-11 (×2): 8 [IU] via SUBCUTANEOUS

## 2018-07-11 MED ORDER — PROPOFOL 500 MG/50ML IV EMUL
INTRAVENOUS | Status: DC | PRN
  Administered 2018-07-11: 35 ug/kg/min via INTRAVENOUS

## 2018-07-11 MED ORDER — INSULIN ASPART 100 UNIT/ML FLEXPEN: 1.0000 [IU] | PEN_INJECTOR | Freq: Three times a day (TID) | SUBCUTANEOUS | Status: DC

## 2018-07-11 MED ORDER — KETOROLAC TROMETHAMINE 30 MG/ML IJ SOLN
INTRAMUSCULAR | Status: AC
  Filled 2018-07-11: qty 1

## 2018-07-11 MED ORDER — THROMBIN 5000 UNITS EX SOLR
OROMUCOSAL | Status: DC | PRN
  Administered 2018-07-11: 09:00:00 via TOPICAL

## 2018-07-11 MED ORDER — BUPIVACAINE HCL (PF) 0.5 % IJ SOLN
INTRAMUSCULAR | Status: AC
  Filled 2018-07-11: qty 30

## 2018-07-11 MED ORDER — ACETAMINOPHEN 650 MG RE SUPP: 650.0000 mg | RECTAL | Status: DC | PRN

## 2018-07-11 MED ORDER — PHENYLEPHRINE 40 MCG/ML (10ML) SYRINGE FOR IV PUSH (FOR BLOOD PRESSURE SUPPORT)
PREFILLED_SYRINGE | INTRAVENOUS | Status: AC
  Filled 2018-07-11: qty 10

## 2018-07-11 MED ORDER — HYDROXYZINE HCL 50 MG/ML IM SOLN: 50.0000 mg | INTRAMUSCULAR | Status: DC | PRN

## 2018-07-11 MED ORDER — LIDOCAINE-EPINEPHRINE 1 %-1:100000 IJ SOLN
INTRAMUSCULAR | Status: DC | PRN
  Administered 2018-07-11: 15 mL

## 2018-07-11 MED ORDER — SCOPOLAMINE 1 MG/3DAYS TD PT72
MEDICATED_PATCH | TRANSDERMAL | Status: AC
  Filled 2018-07-11: qty 1

## 2018-07-11 MED ORDER — INSULIN ASPART 100 UNIT/ML ~~LOC~~ SOLN
0.0000 [IU] | Freq: Every day | SUBCUTANEOUS | Status: DC
  Administered 2018-07-11: 3 [IU] via SUBCUTANEOUS

## 2018-07-11 MED ORDER — THROMBIN 20000 UNITS EX SOLR
CUTANEOUS | Status: DC | PRN
  Administered 2018-07-11: 09:00:00 via TOPICAL

## 2018-07-11 MED ORDER — CYCLOBENZAPRINE HCL 5 MG PO TABS
5.0000 mg | ORAL_TABLET | Freq: Three times a day (TID) | ORAL | Status: DC | PRN
  Administered 2018-07-11: 10 mg via ORAL

## 2018-07-11 MED ORDER — HYDROMORPHONE HCL 1 MG/ML IJ SOLN
INTRAMUSCULAR | Status: AC
  Filled 2018-07-11: qty 1

## 2018-07-11 MED ORDER — ALUM & MAG HYDROXIDE-SIMETH 200-200-20 MG/5ML PO SUSP: 30.0000 mL | Freq: Four times a day (QID) | ORAL | Status: DC | PRN

## 2018-07-11 MED ORDER — CYCLOBENZAPRINE HCL 10 MG PO TABS
ORAL_TABLET | ORAL | Status: AC
  Filled 2018-07-11: qty 1

## 2018-07-11 MED ORDER — PROPOFOL 10 MG/ML IV BOLUS
INTRAVENOUS | Status: AC
  Filled 2018-07-11: qty 20

## 2018-07-11 MED ORDER — MIDAZOLAM HCL 5 MG/5ML IJ SOLN
INTRAMUSCULAR | Status: DC | PRN
  Administered 2018-07-11: 2 mg via INTRAVENOUS

## 2018-07-11 MED ORDER — LINAGLIPTIN 5 MG PO TABS
5.0000 mg | ORAL_TABLET | Freq: Every day | ORAL | Status: DC
  Administered 2018-07-12: 5 mg via ORAL
  Filled 2018-07-11 (×2): qty 1

## 2018-07-11 MED ORDER — PANTOPRAZOLE SODIUM 40 MG PO TBEC
40.0000 mg | DELAYED_RELEASE_TABLET | Freq: Every day | ORAL | Status: DC
  Administered 2018-07-12: 40 mg via ORAL
  Filled 2018-07-11: qty 1

## 2018-07-11 MED ORDER — SIMVASTATIN 20 MG PO TABS
40.0000 mg | ORAL_TABLET | Freq: Every day | ORAL | Status: DC
  Administered 2018-07-11: 40 mg via ORAL
  Filled 2018-07-11: qty 2

## 2018-07-11 MED ORDER — LIDOCAINE-EPINEPHRINE 1 %-1:100000 IJ SOLN
INTRAMUSCULAR | Status: AC
  Filled 2018-07-11: qty 1

## 2018-07-11 MED ORDER — 0.9 % SODIUM CHLORIDE (POUR BTL) OPTIME
TOPICAL | Status: DC | PRN
  Administered 2018-07-11 (×2): 1000 mL

## 2018-07-11 MED ORDER — HYDROCODONE-ACETAMINOPHEN 5-325 MG PO TABS
1.0000 | ORAL_TABLET | ORAL | Status: DC | PRN
  Administered 2018-07-11 – 2018-07-12 (×4): 1 via ORAL
  Filled 2018-07-11 (×4): qty 1

## 2018-07-11 MED ORDER — LOSARTAN POTASSIUM-HCTZ 50-12.5 MG PO TABS: 1.0000 | ORAL_TABLET | Freq: Every day | ORAL | Status: DC

## 2018-07-11 MED ORDER — ROCURONIUM BROMIDE 50 MG/5ML IV SOSY
PREFILLED_SYRINGE | INTRAVENOUS | Status: AC
  Filled 2018-07-11: qty 5

## 2018-07-11 SURGICAL SUPPLY — 84 items
BAG DECANTER FOR FLEXI CONT (MISCELLANEOUS) ×2 IMPLANT
BUR ACRON 5.0MM COATED (BURR) ×3 IMPLANT
BUR MATCHSTICK NEURO 3.0 LAGG (BURR) ×2 IMPLANT
CAGE LUM TRIT -9D 13X23X12 (Cage) ×2 IMPLANT
CANISTER SUCT 3000ML PPV (MISCELLANEOUS) ×2 IMPLANT
CAP LCK SPNE (Orthopedic Implant) ×4 IMPLANT
CAP LOCK SPINE RADIUS (Orthopedic Implant) IMPLANT
CAP LOCKING (Orthopedic Implant) ×4 IMPLANT
CARTRIDGE OIL MAESTRO DRILL (MISCELLANEOUS) ×1 IMPLANT
CONT SPEC 4OZ CLIKSEAL STRL BL (MISCELLANEOUS) ×2 IMPLANT
COVER BACK TABLE 60X90IN (DRAPES) ×2 IMPLANT
COVER WAND RF STERILE (DRAPES) ×1 IMPLANT
DERMABOND ADVANCED (GAUZE/BANDAGES/DRESSINGS) ×1
DERMABOND ADVANCED .7 DNX12 (GAUZE/BANDAGES/DRESSINGS) ×1 IMPLANT
DIFFUSER DRILL AIR PNEUMATIC (MISCELLANEOUS) ×2 IMPLANT
DRAPE C-ARM 42X72 X-RAY (DRAPES) ×4 IMPLANT
DRAPE C-ARMOR (DRAPES) IMPLANT
DRAPE HALF SHEET 40X57 (DRAPES) ×2 IMPLANT
DRAPE LAPAROTOMY 100X72X124 (DRAPES) ×2 IMPLANT
DRAPE POUCH INSTRU U-SHP 10X18 (DRAPES) ×2 IMPLANT
ELECT BLADE 4.0 EZ CLEAN MEGAD (MISCELLANEOUS) ×2
ELECT REM PT RETURN 9FT ADLT (ELECTROSURGICAL) ×2
ELECTRODE BLDE 4.0 EZ CLN MEGD (MISCELLANEOUS) IMPLANT
ELECTRODE REM PT RTRN 9FT ADLT (ELECTROSURGICAL) ×1 IMPLANT
GAUZE 4X4 16PLY RFD (DISPOSABLE) IMPLANT
GAUZE SPONGE 4X4 12PLY STRL (GAUZE/BANDAGES/DRESSINGS) ×2 IMPLANT
GAUZE SPONGE 4X4 12PLY STRL LF (GAUZE/BANDAGES/DRESSINGS) ×1 IMPLANT
GLOVE BIOGEL PI IND STRL 6 (GLOVE) IMPLANT
GLOVE BIOGEL PI IND STRL 6.5 (GLOVE) IMPLANT
GLOVE BIOGEL PI IND STRL 7.5 (GLOVE) IMPLANT
GLOVE BIOGEL PI IND STRL 8 (GLOVE) ×2 IMPLANT
GLOVE BIOGEL PI IND STRL 8.5 (GLOVE) IMPLANT
GLOVE BIOGEL PI INDICATOR 6 (GLOVE) ×2
GLOVE BIOGEL PI INDICATOR 6.5 (GLOVE) ×3
GLOVE BIOGEL PI INDICATOR 7.5 (GLOVE) ×2
GLOVE BIOGEL PI INDICATOR 8 (GLOVE) ×5
GLOVE BIOGEL PI INDICATOR 8.5 (GLOVE) ×1
GLOVE ECLIPSE 7.5 STRL STRAW (GLOVE) ×7 IMPLANT
GLOVE ECLIPSE 8.5 STRL (GLOVE) ×1 IMPLANT
GLOVE EXAM NITRILE XL STR (GLOVE) IMPLANT
GLOVE SURG SS PI 6.0 STRL IVOR (GLOVE) ×1 IMPLANT
GOWN STRL REUS W/ TWL LRG LVL3 (GOWN DISPOSABLE) IMPLANT
GOWN STRL REUS W/ TWL XL LVL3 (GOWN DISPOSABLE) ×2 IMPLANT
GOWN STRL REUS W/TWL 2XL LVL3 (GOWN DISPOSABLE) ×3 IMPLANT
GOWN STRL REUS W/TWL LRG LVL3 (GOWN DISPOSABLE) ×1
GOWN STRL REUS W/TWL XL LVL3 (GOWN DISPOSABLE) ×2
HEMOSTAT POWDER KIT SURGIFOAM (HEMOSTASIS) ×1 IMPLANT
KIT BASIN OR (CUSTOM PROCEDURE TRAY) ×2 IMPLANT
KIT INFUSE X SMALL 1.4CC (Orthopedic Implant) ×1 IMPLANT
KIT TURNOVER KIT B (KITS) ×2 IMPLANT
NDL ASP BONE MRW 8GX15 (NEEDLE) IMPLANT
NDL HYPO 25X1 1.5 SAFETY (NEEDLE) ×1 IMPLANT
NDL SPNL 18GX3.5 QUINCKE PK (NEEDLE) ×1 IMPLANT
NDL SPNL 22GX3.5 QUINCKE BK (NEEDLE) ×1 IMPLANT
NEEDLE ASP BONE MRW 8GX15 (NEEDLE) ×2 IMPLANT
NEEDLE HYPO 25X1 1.5 SAFETY (NEEDLE) ×2 IMPLANT
NEEDLE SPNL 18GX3.5 QUINCKE PK (NEEDLE) ×4 IMPLANT
NEEDLE SPNL 22GX3.5 QUINCKE BK (NEEDLE) ×4 IMPLANT
NS IRRIG 1000ML POUR BTL (IV SOLUTION) ×3 IMPLANT
OIL CARTRIDGE MAESTRO DRILL (MISCELLANEOUS) ×2
PACK LAMINECTOMY NEURO (CUSTOM PROCEDURE TRAY) ×2 IMPLANT
PAD ARMBOARD 7.5X6 YLW CONV (MISCELLANEOUS) ×6 IMPLANT
PATTIES SURGICAL .5 X.5 (GAUZE/BANDAGES/DRESSINGS) IMPLANT
PATTIES SURGICAL .5 X1 (DISPOSABLE) IMPLANT
PATTIES SURGICAL 1X1 (DISPOSABLE) IMPLANT
ROD RADIUS 35MM (Rod) ×2 IMPLANT
SCREW 5.75 X 635 (Screw) ×2 IMPLANT
SCREW 5.75X45MM (Screw) ×2 IMPLANT
SPONGE LAP 4X18 RFD (DISPOSABLE) IMPLANT
SPONGE NEURO XRAY DETECT 1X3 (DISPOSABLE) IMPLANT
SPONGE SURGIFOAM ABS GEL 100 (HEMOSTASIS) ×2 IMPLANT
SPONGE SURGIFOAM ABS GEL 100C (HEMOSTASIS) IMPLANT
STRIP BIOACTIVE VITOSS 25X100X (Neuro Prosthesis/Implant) ×1 IMPLANT
STRIP BIOACTIVE VITOSS 25X52X4 (Orthopedic Implant) ×1 IMPLANT
SUT VIC AB 1 CT1 18XBRD ANBCTR (SUTURE) ×2 IMPLANT
SUT VIC AB 1 CT1 8-18 (SUTURE) ×2
SUT VIC AB 2-0 CP2 18 (SUTURE) ×4 IMPLANT
SYR 3ML LL SCALE MARK (SYRINGE) ×4 IMPLANT
SYR CONTROL 10ML LL (SYRINGE) ×2 IMPLANT
TAPE CLOTH SURG 4X10 WHT LF (GAUZE/BANDAGES/DRESSINGS) ×1 IMPLANT
TOWEL GREEN STERILE (TOWEL DISPOSABLE) ×2 IMPLANT
TOWEL GREEN STERILE FF (TOWEL DISPOSABLE) ×2 IMPLANT
TRAY FOLEY MTR SLVR 16FR STAT (SET/KITS/TRAYS/PACK) ×2 IMPLANT
WATER STERILE IRR 1000ML POUR (IV SOLUTION) ×2 IMPLANT

## 2018-07-11 NOTE — H&P (Signed)
Subjective: Patient is a 73 y.o. 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  Patient status post previous L4-5 decompression and arthrodesis.  He has had progressively worsening difficulties with low back pain rating down to the lower extremities consistent with neurogenic claudication.  She is limited in standing to 5 to 10 minutes, and limited in walking to less than 100 yards because of the pain that develops with standing and walking.  We have treated her with NSAIDs and epidural steroid injections, which have given her limited relief.  She is admitted now for L5-S1 lumbar decompression and stabilization via bilateral L5-S1 laminectomy, facetectomy, and foraminotomies, bilateral L5-S1 posterior lumbar interbody arthrodesis with interbody implants and bone graft, and bilateral L5-S1 posterior lateral arthrodesis with posterior instrumentation and bone graft.  We may have to remove an existing L4-5 spinous process plate.  Patient Active Problem List   Diagnosis Date Noted  . Uncontrolled type 2 diabetes mellitus with stage 3 chronic kidney disease, with long-term current use of insulin (HCC) 01/24/2016  . Mixed hyperlipidemia 01/24/2016  . Essential hypertension, benign 01/24/2016  . Morbid obesity due to excess calories (HCC) 01/24/2016   Past Medical History:  Diagnosis Date  . Anemia   . Arthritis   . Diabetes mellitus, type II (HCC)   . Dyspnea    on exertion  . Headache   . Pneumonia    h/o  . PONV (postoperative nausea and vomiting)   . Sleep apnea   . Vertigo     Past Surgical History:  Procedure Laterality Date  . ABDOMINAL HYSTERECTOMY    . APPENDECTOMY    . BLEPHAROPLASTY Bilateral 01/2018  . CARPAL TUNNEL RELEASE Left 2008  . HEMORROIDECTOMY    . UMBILICAL HERNIA REPAIR      Medications Prior to Admission  Medication Sig Dispense Refill  Last Dose  . albuterol (PROVENTIL HFA;VENTOLIN HFA) 108 (90 Base) MCG/ACT inhaler Inhale 2 puffs into the lungs every 6 (six) hours as needed for wheezing or shortness of breath.   Past Month at Unknown time  . diclofenac (VOLTAREN) 75 MG EC tablet Take 75 mg by mouth daily as needed for mild pain.   Past Week at Unknown time  . esomeprazole (NEXIUM) 40 MG capsule Take 40 mg by mouth daily.  1 07/11/2018 at 0400  . insulin aspart (NOVOLOG) 100 UNIT/ML FlexPen INJECT 25-31 UNITS INTO THE SKIN 3 (THREE) TIMES DAILY WITH MEALS. (Patient taking differently: Inject 1-27 Units into the skin 3 (three) times daily with meals. Per sliding scale) 90 mL 0 07/10/2018 at Unknown time  . Insulin Glargine (BASAGLAR KWIKPEN) 100 UNIT/ML SOPN INJECT 90 UNITS ( 0.8 MLS) SUBQ DAILY AT 10 PM (Patient taking differently: Inject 70 Units into the skin at bedtime. ) 30 mL 2 07/11/2018 at 1200  . JANUVIA 50 MG tablet TAKE 1 TABLET BY MOUTH EVERY DAY (Patient taking differently: Take 50 mg by mouth daily. ) 90 tablet 1 Past Week at Unknown time  . losartan-hydrochlorothiazide (HYZAAR) 50-12.5 MG tablet Take 1 tablet by mouth daily.   Past Week at Unknown time  . PARoxetine (PAXIL) 10 MG tablet Take 10 mg by mouth daily.  0 07/11/2018 at 0400  . simvastatin (ZOCOR) 40 MG tablet Take 40 mg by mouth daily.   07/10/2018 at 0400  . Vitamin D, Ergocalciferol, (DRISDOL) 50000 units CAPS capsule Take 50,000 Units by mouth every  Sunday.   2 07/10/2018 at Unknown time  . B-D ULTRAFINE III SHORT PEN 31G X 8 MM MISC USE 3 TIMES DAILY AS DIRCTED (Patient not taking: Reported on 06/23/2018) 100 each 5 Not Taking at Unknown time  . Continuous Blood Gluc Sensor (FREESTYLE LIBRE SENSOR SYSTEM) MISC Use one sensor every 10 days. 3 each 2 Not Taking at Unknown time  . NON FORMULARY CPAP      Allergies  Allergen Reactions  . Biaxin [Clarithromycin] Hives    Social History   Tobacco Use  . Smoking status: Never Smoker  . Smokeless tobacco: Never  Used  Substance Use Topics  . Alcohol use: No    Frequency: Never    History reviewed. No pertinent family history.   Review of Systems Pertinent items noted in HPI and remainder of comprehensive ROS otherwise negative.  Objective: Vital signs in last 24 hours: Temp:  [97.4 F (36.3 C)] 97.4 F (36.3 C) (01/20 0612) Pulse Rate:  [74] 74 (01/20 0612) Resp:  [19] 19 (01/20 0612) BP: (148)/(69) 148/69 (01/20 0612) SpO2:  [99 %] 99 % (01/20 0612) Weight:  [115.7 kg] 115.7 kg (01/20 0612)  EXAM: She is an obese white female in no acute distress.   Lungs are clear to auscultation , the patient has symmetrical respiratory excursion. Heart has a regular rate and rhythm normal S1 and S2 no murmur.   Abdomen is soft nontender nondistended bowel sounds are present. Extremity examination shows no clubbing cyanosis or edema.  Mobility is limited in flexion at 75 degrees, she is able to extend to 10 degrees. Motor examination shows 5 over 5 strength in the lower extremities including the iliopsoas quadriceps dorsiflexor extensor hallicus  longus and plantar flexor bilaterally. Sensation is intact to pinprick in the distal lower extremities. Reflexes are symmetrical bilaterally. No pathologic reflexes are present. Patient has a normal gait and stance.   Data Review:CBC    Component Value Date/Time   WBC 9.1 07/04/2018 1407   RBC 4.14 07/04/2018 1407   HGB 11.6 (L) 07/04/2018 1407   HCT 36.1 07/04/2018 1407   PLT 178 07/04/2018 1407   MCV 87.2 07/04/2018 1407   MCH 28.0 07/04/2018 1407   MCHC 32.1 07/04/2018 1407   RDW 13.9 07/04/2018 1407                          BMET    Component Value Date/Time   NA 135 07/04/2018 1407   NA 138 12/13/2015   K 4.2 07/04/2018 1407   CL 102 07/04/2018 1407   CO2 24 07/04/2018 1407   GLUCOSE 293 (H) 07/04/2018 1407   BUN 16 07/04/2018 1407   BUN 18 06/30/2018   CREATININE 1.30 (H) 07/04/2018 1407   CALCIUM 8.9 07/04/2018 1407   GFRNONAA 41 (L)  07/04/2018 1407   GFRAA 48 (L) 07/04/2018 1407     Assessment/Plan: Patient with L5-S1 multifactorial lumbar stenosis who is admitted now for lumbar decompression and stabilization.  I've discussed with the patient the nature of his condition, the nature the surgical procedure, the typical length of surgery, hospital stay, and overall recuperation, the limitations postoperatively, and risks of surgery. I discussed risks including risks of infection, bleeding, possibly need for transfusion, the risk of nerve root dysfunction with pain, weakness, numbness, or paresthesias, the risk of dural tear and CSF leakage and possible need for further surgery, the risk of failure of the arthrodesis and possibly for further surgery,  the risk of anesthetic complications including myocardial infarction, stroke, pneumonia, and death. We discussed the need for postoperative immobilization in a lumbar brace. Understanding all this the patient does wish to proceed with surgery and is admitted for such.   Hewitt ShortsNUDELMAN,ROBERT W, MD 07/11/2018 7:04 AM

## 2018-07-11 NOTE — Anesthesia Procedure Notes (Signed)
Procedure Name: Intubation Date/Time: 07/11/2018 7:40 AM Performed by: Lavell Luster, CRNA Pre-anesthesia Checklist: Patient identified, Emergency Drugs available, Suction available, Patient being monitored and Timeout performed Patient Re-evaluated:Patient Re-evaluated prior to induction Oxygen Delivery Method: Circle system utilized Preoxygenation: Pre-oxygenation with 100% oxygen Induction Type: IV induction Ventilation: Oral airway inserted - appropriate to patient size and Mask ventilation without difficulty Laryngoscope Size: Mac, 4 and Glidescope Grade View: Grade I Tube type: Oral Tube size: 7.0 mm Number of attempts: 2 Airway Equipment and Method: Stylet and Video-laryngoscopy Placement Confirmation: ETT inserted through vocal cords under direct vision,  positive ETCO2 and breath sounds checked- equal and bilateral Secured at: 22 cm Tube secured with: Tape Dental Injury: Injury to lip  Difficulty Due To: Difficulty was anticipated Comments: DL x 1 with MAC 4 blade with Grade IV view, immediately recognized no ETCO2 or chest rise.  DL x 1 with MAC 4 blade on videolaryngoscope with grade I view, ETT passed easily .  Dr Ola Spurr verified placement.  Henderson Cloud, CRNA

## 2018-07-11 NOTE — Progress Notes (Signed)
Vitals:   07/11/18 1440 07/11/18 1455 07/11/18 1525 07/11/18 1542  BP: 138/82 130/80 137/72 (!) 147/73  Pulse: 73 72 72 74  Resp: 20 20 20 19   Temp:  97.7 F (36.5 C)  (!) 97.4 F (36.3 C)  TempSrc:    Oral  SpO2: 96% 99% 99% 94%  Weight:      Height:        Patient up and ambulating in the halls, wearing her brace, with 2 person assist, and using the rolling walker.  Drowsy from Flexeril given in PACU.  Dressing clean and dry.  Describing numbness to the left lower extremity, but overall not a lot of pain or discomfort at this time.  Left dorsiflexion slightly less intense than right dorsiflexion, but at least 4+/5.  Plan: Continue to work on mobility with the staff.  Will consult PT and OT regarding transfers, ambulation, ADLs, etc. continue to progress through postoperative recovery.  Hewitt Shorts, MD 07/11/2018, 6:06 PM

## 2018-07-11 NOTE — Anesthesia Postprocedure Evaluation (Signed)
Anesthesia Post Note  Patient: Tamar Dantuono Milner  Procedure(s) Performed: L5-S1 decompression, PLIF, PLA Lumbar Five-Sacral One Decompression with Posterior Lumbar Interbody Fusion and Posterolateral Arthrodesis (N/A Spine Lumbar)     Patient location during evaluation: PACU Anesthesia Type: General Level of consciousness: awake and alert Pain management: pain level controlled Vital Signs Assessment: post-procedure vital signs reviewed and stable Respiratory status: spontaneous breathing, nonlabored ventilation, respiratory function stable and patient connected to nasal cannula oxygen Cardiovascular status: blood pressure returned to baseline and stable Postop Assessment: no apparent nausea or vomiting Anesthetic complications: no    Last Vitals:  Vitals:   07/11/18 1425 07/11/18 1440  BP: (!) 119/47 138/82  Pulse: 73 73  Resp: 12 20  Temp:    SpO2: 94% 96%    Last Pain:  Vitals:   07/11/18 1445  TempSrc:   PainSc: Asleep                 Shakirah Kirkey,W. EDMOND

## 2018-07-11 NOTE — Op Note (Signed)
07/11/2018  12:54 PM  PATIENT:  Noriah Stjulian Mcisaac  73 y.o. female  PRE-OPERATIVE DIAGNOSIS: Multifactorial L5-S1 lumbar stenosis with neurogenic claudication; lumbar spondylosis; lumbar degenerative disc disease  POST-OPERATIVE DIAGNOSIS:  Multifactorial L5-S1 lumbar stenosis with neurogenic claudication; lumbar spondylosis; lumbar degenerative disc disease  PROCEDURE:  Procedure(s): L5-S1 lumbar decompression including laminectomy, facetectomy, and foraminotomies for decompression of the central canal, lateral recess, and neuroforaminal stenosis with decompression of the exiting L5 and S1 nerve roots, with decompression beyond that required for interbody arthrodesis; bilateral L5-S1 posterior lumbar interbody arthrodesis with Tritanium interbody implants, Vitoss BA with bone marrow aspirate, and infuse; bilateral L5-S1 posterior lateral arthrodesis with nonsegmental radius posterior instrumentation, Vitoss BA with bone marrow aspirate, and infuse  SURGEON:  Surgeon(s): Shirlean Kelly, MD  ASSISTANTS: Barnett Abu, MD  ANESTHESIA:   general  EBL:  Total I/O In: 2750 [I.V.:2500; IV Piggyback:250] Out: 535 [Urine:285; Blood:250]  BLOOD ADMINISTERED:none  CELL SAVER GIVEN: Cell Saver technician felt that there was insufficient blood loss to return processed blood to the patient.  COUNT:  Correct per nursing staff  DICTATION: Patient is brought to the operating room placed under general endotracheal anesthesia. The patient was turned to prone position the lumbar region was prepped with Betadine soap and solution and draped in a sterile fashion. The midline was infiltrated with local anesthesia with epinephrine. A localizing x-ray was taken and then a midline incision was made, through the previous midline incision, and over the L5-S1 level.  It was carried down through the subcutaneous tissue, bipolar cautery and electrocautery were used to maintain hemostasis. Dissection was carried down to  the lumbar fascia. The fascia was incised bilaterally and the paraspinal muscles were dissected with a spinous process and lamina in a subperiosteal fashion. Another x-ray was taken for localization and the L5-S1 level was localized.  The existing midline dorsal fusion mass at the L4-5 level was identified.  Dissection was carried out laterally over the L5-S1 facet complexes and the transverse processes of L5 and the ala of S1 were exposed and decorticated.   The decompression was begun using the high-speed drill and Kerrison punches.  Bilateral L5 and S1 laminectomy was performed.  Dissection was carried out laterally including facetectomy and foraminotomies with decompression of the stenotic compression of the exiting L5 and S1 nerve roots.  Ligamentum flavum was found to be markedly thickened and was carefully removed decompressing the thecal sac and exiting nerve roots.  Once the decompression of the stenotic compression of the thecal sac and exiting nerve roots was completed we proceeded with the posterior lumbar interbody arthrodesis. The annulus was incised bilaterally and the disc space entered. A thorough discectomy was performed using pituitary rongeurs and curettes. Once the discectomy was completed we began to prepare the endplate surfaces removing the cartilaginous endplates surface. We then measured the height of the intervertebral disc space. We selected a 13 x 23 x 12 Tritanium interbody implants.  The C-arm fluoroscope was then draped and brought in the field and we identified the pedicle entry points bilaterally at the L5 and S1 levels. Each of the 4 pedicles was probed, we aspirated bone marrow aspirate from the vertebral bodies, this was injected over a 10 cc and a 5 cc strip of Vitoss BA. Then each of the pedicles was examined with the ball probe good bony surfaces were found and no bony cuts were found. Each of the pedicles was then tapped with a 5.25 mm tap, again examined with the ball  probe good threading was found and no bony cuts were found. We then placed 5.75 by 45 mm screws bilaterally at the L5 level and 5.75 x 35 mm screws bilaterally at the S1 level.  We then packed the interbody implants with Vitoss BA with bone marrow aspirate and infuse, and then placed the first implant and on the right side, carefully retracting the thecal sac and nerve root medially. We then went back to the left side and packed the midline with additional Vitoss BA with bone marrow aspirate and infuse, and then placed a second implant and on the left side again retracting the thecal sac and nerve root medially. Additional Vitoss BA with bone marrow aspirate was packed lateral to the implants.  We then packed the lateral gutter over the transverse processes and intertransverse space with Vitoss BA with bone marrow aspirate and infuse. We then selected 35 mm pre-lordosed rods, they were placed within the screw heads and secured with locking caps once all 4 locking caps were placed final tightening was performed against a counter torque.  The wound had been irrigated multiple times during the procedure with saline solution and bacitracin solution, good hemostasis was established with a combination of bipolar cautery and Gelfoam with thrombin.  The Gelfoam was removed and a thin layer of Surgifoam applied.  Once good hemostasis was confirmed we proceeded with closure paraspinal muscles deep fascia and Scarpa's fascia were closed with interrupted undyed 1 Vicryl sutures the subcutaneous and subcuticular closed with interrupted inverted 2-0 undyed Vicryl sutures the skin edges were approximated with Dermabond.  A dressing of sterile gauze and Hypafix was applied.  Following surgery the patient was turned back to the supine position to be reversed and the anesthetic extubated and transferred to the recovery room for further care.  PLAN OF CARE: Admit to inpatient   PATIENT DISPOSITION:  PACU - hemodynamically  stable.   Delay start of Pharmacological VTE agent (>24hrs) due to surgical blood loss or risk of bleeding:  yes

## 2018-07-11 NOTE — Transfer of Care (Signed)
Immediate Anesthesia Transfer of Care Note  Patient: Tiffany Brock  Procedure(s) Performed: L5-S1 decompression, PLIF, PLA Lumbar Five-Sacral One Decompression with Posterior Lumbar Interbody Fusion and Posterolateral Arthrodesis (N/A Spine Lumbar)  Patient Location: PACU  Anesthesia Type:General  Level of Consciousness: awake, alert  and oriented  Airway & Oxygen Therapy: Patient connected to face mask oxygen  Post-op Assessment: Post -op Vital signs reviewed and stable  Post vital signs: stable  Last Vitals:  Vitals Value Taken Time  BP 141/77 07/11/2018  1:54 PM  Temp    Pulse 73 07/11/2018  1:54 PM  Resp 15 07/11/2018  1:54 PM  SpO2 99 % 07/11/2018  1:54 PM  Vitals shown include unvalidated device data.  Last Pain:  Vitals:   07/11/18 0612  TempSrc: Oral  PainSc: 8       Patients Stated Pain Goal: 6 (07/11/18 0612)  Complications: No apparent anesthesia complications

## 2018-07-11 NOTE — Anesthesia Procedure Notes (Signed)
Performed by: Maeola Harman, MD

## 2018-07-12 LAB — GLUCOSE, CAPILLARY
Glucose-Capillary: 234 mg/dL — ABNORMAL HIGH (ref 70–99)
Glucose-Capillary: 196 mg/dL — ABNORMAL HIGH (ref 70–99)

## 2018-07-12 MED ORDER — METHOCARBAMOL 500 MG PO TABS
500.0000 mg | ORAL_TABLET | Freq: Four times a day (QID) | ORAL | Status: DC | PRN
  Administered 2018-07-12: 500 mg via ORAL
  Filled 2018-07-12: qty 1

## 2018-07-12 MED ORDER — HYDROCODONE-ACETAMINOPHEN 5-325 MG PO TABS: 1.0000 | ORAL_TABLET | ORAL | 0 refills | Status: DC | PRN

## 2018-07-12 MED ORDER — METHOCARBAMOL 500 MG PO TABS: 500.0000 mg | ORAL_TABLET | Freq: Four times a day (QID) | ORAL | 1 refills | Status: DC | PRN

## 2018-07-12 NOTE — Discharge Summary (Signed)
Physician Discharge Summary  Patient ID: Tiffany Brock MRN: [id number] DOB/AGE: February 28, 1946 73 y.o.  Admit date: 07/11/2018 Discharge date: 07/12/2018  Admission Diagnoses:  Multifactorial L5-S1 lumbar stenosis with neurogenic claudication; lumbar spondylosis; lumbar degenerative disc disease  Discharge Diagnoses:  Multifactorial L5-S1 lumbar stenosis with neurogenic claudication; lumbar spondylosis; lumbar degenerative disc disease Active Problems:   Lumbar stenosis with neurogenic claudication   Discharged Condition: good  Hospital Course: Patient was admitted, underwent an L5-S1 lumbar decompression and arthrodesis.  Postoperatively she has had some numbness and discomfort in the left lower extremity.  She has been ambulating with the assistance of the staff in the halls, using a rolling walker.  PT and OT were consulted, and recommended home health PT and OT (they did not feel the patient was a candidate for CIR); however case management contacted all of the home health providers in her area, and none apparently participate with her insurance.  I have discussed the situation with the patient and her husband, and discussed the possibility of outpatient PT as well as rehabilitation in a skilled nursing facility.  The patient explains that her level of activity prior to hospitalization was quite limited, and certainly she is deconditioned.  After discussing alternatives with the patient and her husband, their preference is to return home and proceed with outpatient physical therapy at [facility]).  I have given the patient a prescription for outpatient physical therapy following lumbar fusion for gait stabilization, lumbar strengthening and reconditioning, to be done 2-3 times per week for 4 to 6 weeks.  I have also explained the patient and her husband that we want her doing incentive spirometry every hour while awake for the first week.  I have also  explained that I want her walking in her home each hour, using a rolling walker (which they explained they have at home already).  We did remove her dressing, and the wound is healing nicely.  There is no erythema, swelling, or drainage.  The wound was left open to air.  She was given instructions regarding wound care and activities following discharge.  She is scheduled for follow-up with me in the office in 3 weeks with x-rays.  Discharge Exam: Blood pressure (!) 100/43, pulse 81, temperature 97.7 F (36.5 C), temperature source Oral, resp. rate 16, height 5\' 5"  (1.651 m), weight 115.7 kg, SpO2 94 %.  Disposition: Discharge disposition: 01-Home or Self Care       Discharge Instructions    Discharge wound care:   Complete by:  As directed    Leave the wound open to air. Shower daily with the wound uncovered. Water and soapy water should run over the incision area. Do not wash directly on the incision for 2 weeks. Remove the glue after 2 weeks.   Driving Restrictions   Complete by:  As directed    No driving for 2 weeks. May ride in the car locally now. May begin to drive locally in 2 weeks.   Other Restrictions   Complete by:  As directed    Walk gradually increasing distances out in the fresh air at least twice a day. Walking additional 6 times inside the house, gradually increasing distances, daily. No bending, lifting, or twisting. Perform activities between shoulder and waist height (that is at counter height when standing or table height when sitting).     Allergies as of 07/12/2018      Reactions   Biaxin [clarithromycin] Hives  Medication List    TAKE these medications   albuterol 108 (90 Base) MCG/ACT inhaler Commonly known as:  PROVENTIL HFA;VENTOLIN HFA Inhale 2 puffs into the lungs every 6 (six) hours as needed for wheezing or shortness of breath.   B-D ULTRAFINE III SHORT PEN 31G X 8 MM Misc Generic drug:  Insulin Pen Needle USE TO TEST BLOOD SUGAR THREE TIMES  DAILY AS DIRECTED What changed:  See the new instructions.   BASAGLAR KWIKPEN 100 UNIT/ML Sopn INJECT 90 UNITS ( 0.8 MLS) SUBQ DAILY AT 10 PM What changed:    how much to take  how to take this  when to take this  additional instructions   diclofenac 75 MG EC tablet Commonly known as:  VOLTAREN Take 75 mg by mouth daily as needed for mild pain.   esomeprazole 40 MG capsule Commonly known as:  NEXIUM Take 40 mg by mouth daily.   FREESTYLE LIBRE SENSOR SYSTEM Misc Use one sensor every 10 days.   HYDROcodone-acetaminophen 5-325 MG tablet Commonly known as:  NORCO/VICODIN Take 1-2 tablets by mouth every 4 (four) hours as needed (pain).   insulin aspart 100 UNIT/ML FlexPen Commonly known as:  NOVOLOG INJECT 25-31 UNITS INTO THE SKIN 3 (THREE) TIMES DAILY WITH MEALS. What changed:    how much to take  additional instructions   JANUVIA 50 MG tablet Generic drug:  sitaGLIPtin TAKE 1 TABLET BY MOUTH EVERY DAY What changed:  how much to take   losartan-hydrochlorothiazide 50-12.5 MG tablet Commonly known as:  HYZAAR Take 1 tablet by mouth daily.   methocarbamol 500 MG tablet Commonly known as:  ROBAXIN Take 1 tablet (500 mg total) by mouth every 6 (six) hours as needed for muscle spasms.   NON FORMULARY CPAP   PARoxetine 10 MG tablet Commonly known as:  PAXIL Take 10 mg by mouth daily.   simvastatin 40 MG tablet Commonly known as:  ZOCOR Take 40 mg by mouth daily.   Vitamin D (Ergocalciferol) 1.25 MG (50000 UT) Caps capsule Commonly known as:  DRISDOL Take 50,000 Units by mouth every Sunday.            Discharge Care Instructions  (From admission, onward)         Start     Ordered   07/12/18 0000  Discharge wound care:    Comments:  Leave the wound open to air. Shower daily with the wound uncovered. Water and soapy water should run over the incision area. Do not wash directly on the incision for 2 weeks. Remove the glue after 2 weeks.   07/12/18  1504           Signed: Hewitt Shorts 07/12/2018, 3:04 PM

## 2018-07-12 NOTE — Evaluation (Signed)
Occupational Therapy Evaluation Patient Details Name: Lucrezia EuropeJudith G Chauvin MRN: [id number] DOB: Jun 28, 1945 Today's Date: 07/12/2018    History of Present Illness Pt is a 73 y/o female with a PMH significant for vertigo, PNA, HA, DM II. Pt presents to OT s/p L5-S1 PLIF on 07/11/2018.    Clinical Impression   Pt presents to OT s/p spinal sx with c/o surgical pain, reduced knowledge of precautions, and limitations in ADLs and functional mobility. Pt was instructed in functional adherence to back precautions during ADLs and the potential use of AE for LB dressing. [facility]t pt requiring min A with functional mobility and LB ADLs. OT will continue to follow acutely.     Follow Up Recommendations  Home health OT    Equipment Recommendations  None recommended by OT    Recommendations for Other Services PT consult     Precautions / Restrictions Precautions Precautions: Fall;Back Precaution Booklet Issued: Yes (comment) Precaution Comments: Reviewed precautions with pt and husband, demo and instruction re adherence during ADLs Required Braces or Orthoses: Spinal Brace Spinal Brace: Lumbar corset;Applied in standing position Restrictions Weight Bearing Restrictions: No      Mobility Bed Mobility Overal bed mobility: Needs Assistance Bed Mobility: Rolling;Sidelying to Sit;Sit to Sidelying Rolling: Min guard Sidelying to sit: Min assist     Sit to sidelying: Min assist General bed mobility comments: Assist for trunk elevation to sit and LE elevation back into sidelying. Pt required VC's for sequencing and log roll. Heavy use of rails and HOB elevated for assist.   Transfers Overall transfer level: Needs assistance Equipment used: Rolling walker (2 wheeled) Transfers: Sit to/from Stand Sit to Stand: Min assist;Min guard         General transfer comment: Min guard to min assist for balance support and safety. VC's for hand placement on seated surface  for safety.     Balance Overall balance assessment: Needs assistance Sitting-balance support: Feet supported;No upper extremity supported Sitting balance-Leahy Scale: Fair     Standing balance support: Bilateral upper extremity supported;During functional activity Standing balance-Leahy Scale: Poor                             ADL either performed or assessed with clinical judgement   ADL Overall ADL's : Needs assistance/impaired Eating/Feeding: Independent;Sitting   Grooming: Wash/dry face;Wash/dry hands;Min guard;Standing   Upper Body Bathing: Supervision/ safety;Sitting;Cueing for compensatory techniques   Lower Body Bathing: Minimal assistance;Sit to/from stand Lower Body Bathing Details (indicate cue type and reason): cueing for back precautions adherence Upper Body Dressing : Supervision/safety;Sitting   Lower Body Dressing: Minimal assistance;Sit to/from stand   Toilet Transfer: Minimal assistance;BSC;Ambulation;Cueing for safety;Cueing for sequencing Toilet Transfer Details (indicate cue type and reason): cueing for RW management Toileting- Clothing Manipulation and Hygiene: Minimal assistance;Sit to/from stand;Cueing for back precautions Toileting - Clothing Manipulation Details (indicate cue type and reason): cueing to minimize bending     Functional mobility during ADLs: Minimal assistance;Rolling walker General ADL Comments: min A provided during functional mobility using RW, min cueing for management and positioning      Vision Baseline Vision/History: No visual deficits Patient Visual Report: No change from baseline Vision Assessment?: No apparent visual deficits            Pertinent Vitals/Pain Pain Assessment: 0-10 Pain Score: 4  Faces Pain Scale: Hurts worst Pain Location: Surgical site Pain Descriptors / Indicators: Operative site guarding;Discomfort Pain Intervention(s):  Monitored during session     Hand Dominance Right    Extremity/Trunk Assessment Upper Extremity Assessment Upper Extremity Assessment: Generalized weakness   Lower Extremity Assessment Lower Extremity Assessment: Defer to PT evaluation RLE Deficits / Details: Consistent calf cramping with movement. Decreased sensation in lower leg and foot RLE Sensation: decreased light touch;decreased proprioception;history of peripheral neuropathy LLE Deficits / Details: Decreased sensation in lateral thigh and upper 1/3 of lower leg, lower 2/3 of leg and foot completely numb to light touch.    Cervical / Trunk Assessment Cervical / Trunk Assessment: Other exceptions Cervical / Trunk Exceptions: forward head posture and rounded shoulders. s/p surgery   Communication Communication Communication: No difficulties   Cognition Arousal/Alertness: Awake/alert Behavior During Therapy: WFL for tasks assessed/performed Overall Cognitive Status: Within Functional Limits for tasks assessed                                     General Comments  Pt's husband present throughout session            Home Living Family/patient expects to be discharged to:: Private residence Living Arrangements: Spouse/significant other Available Help at Discharge: Family;Available 24 hours/day Type of Home: House Home Access: Level entry     Home Layout: Two level;Laundry or work area in basement;Able to live on main level with bedroom/bathroom     Bathroom Shower/Tub: Chief Strategy Officer: Handicapped height     Home Equipment: Environmental consultant - 2 wheels;Shower seat;Grab bars - tub/shower   Additional Comments: 1 low and 1 high toilet seat      Prior Functioning/Environment Level of Independence: Needs assistance        Comments: Pt reports she has had ongoing neuropathy symptoms in BLE's (L worse than R) which limits mobility. Pt states cramping in R calf PTA has been limiting mobility as well. Pt typically has to sit and rest often when  ambulating in the community.         OT Problem List: Pain;Obesity;Impaired balance (sitting and/or standing);Decreased activity tolerance;Decreased knowledge of use of DME or AE;Decreased knowledge of precautions;Decreased safety awareness;Decreased strength      OT Treatment/Interventions: Self-care/ADL training;Therapeutic exercise;Energy conservation;Balance training;Patient/family education;Therapeutic activities;DME and/or AE instruction    OT Goals(Current goals can be found in the care plan section) Acute Rehab OT Goals Patient Stated Goal: to get better at walking OT Goal Formulation: With patient/family Time For Goal Achievement: 07/21/18 Potential to Achieve Goals: Good  OT Frequency: Min 2X/week    AM-PAC OT "6 Clicks" Daily Activity     Outcome Measure Help from another person eating meals?: None Help from another person taking care of personal grooming?: A Little Help from another person toileting, which includes using toliet, bedpan, or urinal?: A Little Help from another person bathing (including washing, rinsing, drying)?: A Little Help from another person to put on and taking off regular upper body clothing?: A Little Help from another person to put on and taking off regular lower body clothing?: A Little 6 Click Score: 19   End of Session Equipment Utilized During Treatment: Rolling walker;Gait belt;Back brace Nurse Communication: Mobility status;Other (comment)(pt voided urine)  Activity Tolerance: Patient tolerated treatment well Patient left: in bed;with call bell/phone within reach;with family/visitor present  OT Visit Diagnosis: Unsteadiness on feet (R26.81);Muscle weakness (generalized) (M62.81)                Time: 6295-2841 OT  Time Calculation (min): 36 min Charges:  OT General Charges $OT Visit: 1 Visit OT Evaluation $OT Eval Low Complexity: 1 Low OT Treatments $Self Care/Home Management : 8-22 mins  Crissie ReeseSandra H Sulay Brymer OTR/L  07/12/2018, 9:39 AM

## 2018-07-12 NOTE — Care Management Note (Signed)
Case Management Note  Patient Details  Name: Tiffany Brock MRN: [id number] Date of Birth: Dec 21, 1945  Subjective/Objective:  Patient is 73 yr old female s/p L5-S1 PLIF.                   Action/Plan: Case manager spoke with patient concerning discharge plan and need for Home Health services. Patient lives in [place], [place] and has [employer]. CM has contacted several Home Health agencies that do not contract with this plan. CM waiting for return call from [facility]. Patient is not a candidate for CIR, will see if she is able to do outpatient therapy and if not may require shortterm rehab at [facility]. CM will continue to monitor.   Expected Discharge Date:     pending             Expected Discharge Plan:  Home w Home Health Services  In-House Referral:  NA  Discharge planning Services  CM Consult  Post Acute Care Choice:  Home Health Choice offered to:  Patient  DME Arranged:  N/A DME Agency:  NA  HH Arranged:  PT, OT HH Agency:     Status of Service:  In process, will continue to follow  If discussed at Long Length of Stay Meetings, dates discussed:    Additional Comments:  Durenda Guthrie, RN 07/12/2018, 12:22 PM

## 2018-07-12 NOTE — Evaluation (Signed)
Physical Therapy Evaluation Patient Details Name: Tiffany EuropeJudith G Brock MRN: [id number] DOB: 1946-05-07 Today's Date: 07/12/2018   History of Present Illness  Pt is a 73 y/o female with a PMH significant for vertigo, PNA, HA, DM II. Pt presents to PT s/p L5-S1 PLIF on 07/11/2018.   Clinical Impression  Pt admitted with above diagnosis. Pt currently with functional limitations due to the deficits listed below (see PT Problem List). At the time of PT eval pt was able to perform transfers and ambulation with up to min assist for balance support and safety. Chair follow utilized for safety as pt was requiring several seated rest breaks when ambulating overnight. Pt appears to be near baseline in regards to endurance. Main limiting factor throughout session was R gastroc cramping - RN notified. At this time I do not feel this pt will be able to tolerate the intensity of a higher level rehab such as CIR. However, feel family would be able to safely manage with pt at home with home health therapies to follow up, to address decreased strength and tolerance for functional activity, and reduce risk for falls. Acutely, pt will benefit from skilled PT to increase their independence and safety with mobility to allow discharge to the venue listed below.       Follow Up Recommendations Home health PT;Supervision for mobility/OOB    Equipment Recommendations  None recommended by PT    Recommendations for Other Services       Precautions / Restrictions Precautions Precautions: Fall;Back Precaution Booklet Issued: Yes (comment) Precaution Comments: Reviewed precautions with pt and husband, and she was cued for maintenance of precautions during functional mobility.  Required Braces or Orthoses: Spinal Brace Spinal Brace: Lumbar corset;Applied in standing position Restrictions Weight Bearing Restrictions: No      Mobility  Bed Mobility Overal bed mobility: Needs Assistance Bed Mobility: Rolling;Sidelying to  Sit;Sit to Sidelying Rolling: Min guard Sidelying to sit: Min assist     Sit to sidelying: Min assist General bed mobility comments: Assist for trunk elevation to sit and LE elevation back into sidelying. Pt required VC's for sequencing and log roll. Heavy use of rails and HOB elevated for assist.   Transfers Overall transfer level: Needs assistance Equipment used: Rolling walker (2 wheeled) Transfers: Sit to/from Stand Sit to Stand: Min assist;Min guard         General transfer comment: Min guard to min assist for balance support and safety. VC's for hand placement on seated surface for safety.   Ambulation/Gait Ambulation/Gait assistance: Min guard;+2 safety/equipment(Chair follow) Gait Distance (Feet): 40 Feet Assistive device: Rolling walker (2 wheeled) Gait Pattern/deviations: Step-through pattern;Decreased stride length;Trunk flexed;Wide base of support Gait velocity: Decreased Gait velocity interpretation: <1.8 ft/sec, indicate of risk for recurrent falls General Gait Details: Wide BOS, likely due to body habitus. Pt was limited due to R calf cramping. Husband provided wheelchair follow. Pt attempted to try another bout of ambulation but she was unable due to cramping. While ambulating, no instance of knee buckle, however pt relying heavily on RW for support.   Stairs            Wheelchair Mobility    Modified Rankin (Stroke Patients Only)       Balance Overall balance assessment: Needs assistance Sitting-balance support: Feet supported;No upper extremity supported Sitting balance-Leahy Scale: Fair     Standing balance support: Bilateral upper extremity supported;During functional activity Standing balance-Leahy Scale: Poor  Pertinent Vitals/Pain Pain Assessment: Faces Faces Pain Scale: Hurts worst Pain Location: Incision site faces 6/10; when R calf is cramping faces 10/10  Pain Descriptors / Indicators:  Operative site guarding;Discomfort Pain Intervention(s): RN gave pain meds during session;Monitored during session    Home Living Family/patient expects to be discharged to:: Private residence Living Arrangements: Spouse/significant other Available Help at Discharge: Family;Available 24 hours/day Type of Home: House Home Access: Level entry     Home Layout: Two level;Laundry or work area in basement;Able to live on main level with bedroom/bathroom Home Equipment: Environmental consultant - 2 wheels;Shower seat;Grab bars - tub/shower      Prior Function Level of Independence: Needs assistance         Comments: Pt reports she has had ongoing neuropathy symptoms in BLE's (L worse than R) which limits mobility. Pt states cramping in R calf PTA has been limiting mobility as well. Pt typically has to sit and rest often when ambulating in the community.      Hand Dominance        Extremity/Trunk Assessment   Upper Extremity Assessment Upper Extremity Assessment: Defer to OT evaluation    Lower Extremity Assessment Lower Extremity Assessment: RLE deficits/detail;LLE deficits/detail RLE Deficits / Details: Consistent calf cramping with movement. Decreased sensation in lower leg and foot RLE Sensation: decreased light touch;decreased proprioception;history of peripheral neuropathy LLE Deficits / Details: Decreased sensation in lateral thigh and upper 1/3 of lower leg, lower 2/3 of leg and foot completely numb to light touch.     Cervical / Trunk Assessment Cervical / Trunk Assessment: Other exceptions Cervical / Trunk Exceptions: forward head posture and rounded shoulders. s/p surgery  Communication   Communication: No difficulties  Cognition Arousal/Alertness: Awake/alert Behavior During Therapy: Anxious Overall Cognitive Status: Within Functional Limits for tasks assessed                                        General Comments      Exercises     Assessment/Plan    PT  Assessment Patient needs continued PT services  PT Problem List Decreased strength;Decreased activity tolerance;Decreased balance;Decreased mobility;Decreased knowledge of use of DME;Decreased safety awareness;Decreased knowledge of precautions;Cardiopulmonary status limiting activity;Obesity;Pain       PT Treatment Interventions DME instruction;Gait training;Stair training;Functional mobility training;Therapeutic activities;Therapeutic exercise;Neuromuscular re-education;Patient/family education    PT Goals (Current goals can be found in the Care Plan section)  Acute Rehab PT Goals Patient Stated Goal: Decrease cramps in calf PT Goal Formulation: With patient/family Time For Goal Achievement: 07/19/18 Potential to Achieve Goals: Good    Frequency Min 5X/week   Barriers to discharge        Co-evaluation               AM-PAC PT "6 Clicks" Mobility  Outcome Measure Help needed turning from your back to your side while in a flat bed without using bedrails?: A Little Help needed moving from lying on your back to sitting on the side of a flat bed without using bedrails?: A Little Help needed moving to and from a bed to a chair (including a wheelchair)?: A Little Help needed standing up from a chair using your arms (e.g., wheelchair or bedside chair)?: A Little Help needed to walk in hospital room?: A Little Help needed climbing 3-5 steps with a railing? : A Lot 6 Click Score: 17    End of  Session Equipment Utilized During Treatment: Gait belt;Back brace Activity Tolerance: Patient limited by fatigue;Patient limited by pain Patient left: in bed;with call bell/phone within reach;with family/visitor present Nurse Communication: Mobility status(Calf cramping) PT Visit Diagnosis: Unsteadiness on feet (R26.81);Pain;Muscle weakness (generalized) (M62.81) Pain - Right/Left: Right Pain - part of body: Leg(back)    Time: [id number] PT Time Calculation (min) (ACUTE ONLY): 31  min   Charges:   PT Evaluation $PT Eval Moderate Complexity: 1 Mod PT Treatments $Gait Training: 8-22 mins        Conni SlipperLaura Mickie Kozikowski, PT, DPT Acute Rehabilitation Services Pager: [phone] Office: [phone]   Tiffany PearsonLaura D Dezi Brock 07/12/2018, 9:25 AM

## 2018-07-12 NOTE — Discharge Instructions (Signed)
°  Call Your Doctor If Any of These Occur °Redness, drainage, or swelling at the wound.  °Temperature greater than 101 degrees. °Severe pain not relieved by pain medication. °Incision starts to come apart. °Follow Up Appt °Call today for appointment in 3 weeks ([phone]) or for problems.  If you have any hardware placed in your spine, you will need an x-ray before your appointment. °

## 2018-07-12 NOTE — Progress Notes (Signed)
RT placed patient on CPAP of 11. Patient home settings and home mask. NO O2 bleed in needed.

## 2018-07-12 NOTE — Progress Notes (Signed)
Patient alert and oriented, mae's well, voiding adequate amount of urine, swallowing without difficulty, no c/o pain at time of discharge, but slight nausea and medication given prior to discharged. Patient discharged home with family. Script and discharged instructions given to patient. Patient and family stated understanding of instructions given. Patient has an appointment with Dr. Newell Coral

## 2018-07-14 MED FILL — Sodium Chloride IV Soln 0.9%: INTRAVENOUS | Qty: 1000 | Status: AC

## 2018-07-14 MED FILL — Heparin Sodium (Porcine) Inj 1000 Unit/ML: INTRAMUSCULAR | Qty: 30 | Status: AC

## 2018-08-29 ENCOUNTER — Telehealth: Payer: Self-pay

## 2018-08-29 DIAGNOSIS — E1122 Type 2 diabetes mellitus with diabetic chronic kidney disease: Secondary | ICD-10-CM

## 2018-08-29 DIAGNOSIS — IMO0002 Reserved for concepts with insufficient information to code with codable children: Secondary | ICD-10-CM

## 2018-08-29 DIAGNOSIS — E1165 Type 2 diabetes mellitus with hyperglycemia: Principal | ICD-10-CM

## 2018-08-29 DIAGNOSIS — N183 Chronic kidney disease, stage 3 (moderate): Principal | ICD-10-CM

## 2018-08-29 DIAGNOSIS — Z794 Long term (current) use of insulin: Principal | ICD-10-CM

## 2018-08-29 MED ORDER — BASAGLAR KWIKPEN 100 UNIT/ML ~~LOC~~ SOPN: 70.0000 [IU] | PEN_INJECTOR | Freq: Every day | SUBCUTANEOUS | 0 refills | Status: DC

## 2018-08-29 NOTE — Telephone Encounter (Signed)
LeighAnn Silvio Sausedo, CMA  

## 2018-08-31 ENCOUNTER — Other Ambulatory Visit: Payer: Self-pay | Admitting: "Endocrinology

## 2018-08-31 DIAGNOSIS — E1122 Type 2 diabetes mellitus with diabetic chronic kidney disease: Secondary | ICD-10-CM

## 2018-08-31 DIAGNOSIS — Z794 Long term (current) use of insulin: Principal | ICD-10-CM

## 2018-08-31 DIAGNOSIS — IMO0002 Reserved for concepts with insufficient information to code with codable children: Secondary | ICD-10-CM

## 2018-08-31 DIAGNOSIS — N183 Chronic kidney disease, stage 3 (moderate): Principal | ICD-10-CM

## 2018-08-31 DIAGNOSIS — E1165 Type 2 diabetes mellitus with hyperglycemia: Principal | ICD-10-CM

## 2018-09-06 ENCOUNTER — Other Ambulatory Visit: Payer: Self-pay | Admitting: "Endocrinology

## 2018-10-11 ENCOUNTER — Ambulatory Visit: Payer: Medicare Other | Admitting: "Endocrinology

## 2018-10-12 LAB — BASIC METABOLIC PANEL
BUN: 18 (ref 4–21)
Creatinine: 1.2 — AB (ref <=1.1)
Potassium: 3.9 (ref 3.4–5.3)
Sodium: 140 (ref 137–147)

## 2018-10-12 LAB — HEMOGLOBIN A1C: Hemoglobin A1C: 8.1

## 2018-10-19 ENCOUNTER — Other Ambulatory Visit: Payer: Self-pay | Admitting: "Endocrinology

## 2018-10-19 ENCOUNTER — Encounter: Payer: Self-pay | Admitting: "Endocrinology

## 2018-10-19 ENCOUNTER — Ambulatory Visit (INDEPENDENT_AMBULATORY_CARE_PROVIDER_SITE_OTHER): Payer: Medicare Other | Admitting: "Endocrinology

## 2018-10-19 ENCOUNTER — Other Ambulatory Visit: Payer: Self-pay

## 2018-10-19 DIAGNOSIS — Z794 Long term (current) use of insulin: Secondary | ICD-10-CM

## 2018-10-19 DIAGNOSIS — E782 Mixed hyperlipidemia: Secondary | ICD-10-CM | POA: Diagnosis not present

## 2018-10-19 DIAGNOSIS — I10 Essential (primary) hypertension: Secondary | ICD-10-CM | POA: Diagnosis not present

## 2018-10-19 DIAGNOSIS — IMO0002 Reserved for concepts with insufficient information to code with codable children: Secondary | ICD-10-CM

## 2018-10-19 DIAGNOSIS — E1165 Type 2 diabetes mellitus with hyperglycemia: Secondary | ICD-10-CM | POA: Diagnosis not present

## 2018-10-19 DIAGNOSIS — N183 Chronic kidney disease, stage 3 (moderate): Secondary | ICD-10-CM

## 2018-10-19 DIAGNOSIS — E1122 Type 2 diabetes mellitus with diabetic chronic kidney disease: Secondary | ICD-10-CM | POA: Diagnosis not present

## 2018-10-19 NOTE — Progress Notes (Signed)
Endocrinology Telehealth Visit Follow up Note -During COVID -19 Pandemic  This visit type was conducted due to national recommendations for restrictions regarding the COVID-19 Pandemic  in an effort to limit this patient's exposure and mitigate transmission of the corona virus.  Due to his co-morbid illnesses, Tiffany Brock is at  moderate to high risk for complications without adequate follow up.  This format is felt to be most appropriate for her at this time.  I connected with this patient on 10/19/2018   by telephone and verified that I am speaking with the correct person using two identifiers. Tiffany Brock, 1946-04-15. she has verbally consented to this visit. All issues noted in this document were discussed and addressed. The format was not optimal for physical exam.    Subjective:    Patient ID: Tiffany Brock, female    DOB: August 12, 1945. Patient is being engaged in telehealth in follow-up for management of type 2 diabetes, hyperlipidemia, hypertension. PCP: Garald Braver  Past Medical History:  Diagnosis Date  . Anemia   . Arthritis   . Diabetes mellitus, type II (HCC)   . Dyspnea    on exertion  . Headache   . Pneumonia    h/o  . PONV (postoperative nausea and vomiting)   . Sleep apnea   . Vertigo    Past Surgical History:  Procedure Laterality Date  . ABDOMINAL HYSTERECTOMY    . APPENDECTOMY    . BLEPHAROPLASTY Bilateral 01/2018  . CARPAL TUNNEL RELEASE Left 2008  . HEMORROIDECTOMY    . UMBILICAL HERNIA REPAIR     Social History   Socioeconomic History  . Marital status: Married    Spouse name: Not on file  . Number of children: Not on file  . Years of education: Not on file  . Highest education level: Not on file  Occupational History  . Not on file  Social Needs  . Financial resource strain: Not on file  . Food insecurity:    Worry: Not on file    Inability: Not on file  . Transportation  needs:    Medical: Not on file    Non-medical: Not on file  Tobacco Use  . Smoking status: Never Smoker  . Smokeless tobacco: Never Used  Substance and Sexual Activity  . Alcohol use: No    Frequency: Never  . Drug use: No  . Sexual activity: Not on file  Lifestyle  . Physical activity:    Days per week: Not on file    Minutes per session: Not on file  . Stress: Not on file  Relationships  . Social connections:    Talks on phone: Not on file    Gets together: Not on file    Attends religious service: Not on file    Active member of club or organization: Not on file    Attends meetings of clubs or organizations: Not on file    Relationship status: Not on file  Other Topics Concern  . Not on file  Social History Narrative  . Not on file   Outpatient Encounter Medications as of 10/19/2018  Medication Sig  . albuterol (PROVENTIL HFA;VENTOLIN HFA) 108 (90 Base)  MCG/ACT inhaler Inhale 2 puffs into the lungs every 6 (six) hours as needed for wheezing or shortness of breath.  . B-D ULTRAFINE III SHORT PEN 31G X 8 MM MISC USE TO TEST BLOOD SUGAR THREE TIMES DAILY AS DIRECTED  . Continuous Blood Gluc Sensor (FREESTYLE LIBRE SENSOR SYSTEM) MISC Use one sensor every 10 days.  . diclofenac (VOLTAREN) 75 MG EC tablet Take 75 mg by mouth daily as needed for mild pain.  Marland Kitchen esomeprazole (NEXIUM) 40 MG capsule Take 40 mg by mouth daily.  Marland Kitchen HYDROcodone-acetaminophen (NORCO/VICODIN) 5-325 MG tablet Take 1-2 tablets by mouth every 4 (four) hours as needed (pain).  . insulin aspart (NOVOLOG) 100 UNIT/ML FlexPen INJECT 25-31 UNITS INTO THE SKIN 3 (THREE) TIMES DAILY WITH MEALS.  Marland Kitchen Insulin Glargine (BASAGLAR KWIKPEN) 100 UNIT/ML SOPN INJECT 90 UNITS SUB-Q DAILY AT 10 PM  . JANUVIA 50 MG tablet TAKE 1 TABLET BY MOUTH EVERY DAY (Patient taking differently: Take 50 mg by mouth daily. )  . losartan-hydrochlorothiazide (HYZAAR) 50-12.5 MG tablet Take 1 tablet by mouth daily.  . methocarbamol (ROBAXIN) 500  MG tablet Take 1 tablet (500 mg total) by mouth every 6 (six) hours as needed for muscle spasms.  . NON FORMULARY CPAP  . PARoxetine (PAXIL) 10 MG tablet Take 10 mg by mouth daily.  . simvastatin (ZOCOR) 40 MG tablet Take 40 mg by mouth daily.  . Vitamin D, Ergocalciferol, (DRISDOL) 50000 units CAPS capsule Take 50,000 Units by mouth every Sunday.    No facility-administered encounter medications on file as of 10/19/2018.    ALLERGIES: Allergies  Allergen Reactions  . Biaxin [Clarithromycin] Hives   VACCINATION STATUS:  There is no immunization history on file for this patient.  Diabetes  She presents for her follow-up diabetic visit. She has type 2 diabetes mellitus. Onset time: She was diagnosed at approximate age of 46 years. Her disease course has been improving. There are no hypoglycemic associated symptoms. Pertinent negatives for hypoglycemia include no confusion, headaches, pallor or seizures. Associated symptoms include blurred vision, fatigue, polydipsia and polyuria. Pertinent negatives for diabetes include no chest pain and no polyphagia. There are no hypoglycemic complications. Symptoms are improving. Risk factors for coronary artery disease include diabetes mellitus, dyslipidemia, family history, hypertension, obesity and sedentary lifestyle. Current diabetic treatment includes insulin injections and oral agent (dual therapy). She is following a generally unhealthy diet. When asked about meal planning, she reported none. She has had a previous visit with a dietitian. She never participates in exercise. Her home blood glucose trend is fluctuating minimally. Her breakfast blood glucose range is generally 180-200 mg/dl. Her lunch blood glucose range is generally 180-200 mg/dl. Her dinner blood glucose range is generally 180-200 mg/dl. Her bedtime blood glucose range is generally 180-200 mg/dl. Her overall blood glucose range is 180-200 mg/dl. An ACE inhibitor/angiotensin II receptor  blocker is being taken. She sees a podiatrist.Eye exam is current.  Hyperlipidemia  This is a chronic problem. The current episode started more than 1 year ago. The problem is controlled. Exacerbating diseases include diabetes and obesity. Pertinent negatives include no chest pain, myalgias or shortness of breath. Current antihyperlipidemic treatment includes statins. Risk factors for coronary artery disease include diabetes mellitus, dyslipidemia, family history, obesity, hypertension and a sedentary lifestyle.  Hypertension  This is a chronic problem. The current episode started more than 1 year ago. Associated symptoms include blurred vision. Pertinent negatives include no chest pain, headaches, palpitations or shortness of breath. Risk factors for coronary artery  disease include dyslipidemia, diabetes mellitus, family history, obesity and sedentary lifestyle. Past treatments include angiotensin blockers.   Review of systems: Limited as above.   Objective:    There were no vitals taken for this visit.  Wt Readings from Last 3 Encounters:  07/11/18 255 lb (115.7 kg)  07/07/18 255 lb (115.7 kg)  07/04/18 255 lb 12.8 oz (116 kg)     CMP     Component Value Date/Time   NA 140 10/12/2018   K 3.9 10/12/2018   CL 102 07/04/2018 1407   CO2 24 07/04/2018 1407   GLUCOSE 293 (H) 07/04/2018 1407   BUN 18 10/12/2018   CREATININE 1.2 (A) 10/12/2018   CREATININE 1.30 (H) 07/04/2018 1407   CALCIUM 8.9 07/04/2018 1407   GFRNONAA 41 (L) 07/04/2018 1407   GFRAA 48 (L) 07/04/2018 1407   Diabetic Labs (most recent): Lab Results  Component Value Date   HGBA1C 8.1 10/12/2018   HGBA1C 8.5 (H) 07/04/2018   HGBA1C 8.6 06/30/2018    Lipid Panel     Component Value Date/Time   CHOL 146 03/31/2018   TRIG 87 03/31/2018   HDL 54 03/31/2018   LDLCALC 75 03/31/2018     Assessment & Plan:   1. Uncontrolled type 2 diabetes mellitus with stage 3 chronic kidney disease, with long-term current use  of insulin (HCC)  - Patient has currently uncontrolled symptomatic type 2 DM since  73 years of age. - She reports improving glycemic profile, A1c of 8.2%, generally improving.      Her diabetes is complicated by  CKD, obesity , and sedentary life and patient remains at a high risk for more acute and chronic complications of diabetes which include CAD, CVA, CKD, retinopathy, and neuropathy. These are all discussed in detail with the patient.  - I have counseled the patient on diet management and weight loss, by adopting a carbohydrate restricted/protein rich diet.  - Patient admits there is a room for improvement in her diet and drink choices. -  Suggestion is made for her to avoid simple carbohydrates  from her diet including Cakes, Sweet Desserts / Pastries, Ice Cream, Soda (diet and regular), Sweet Tea, Candies, Chips, Cookies, Store Bought Juices, Alcohol in Excess of  1-2 drinks a day, Artificial Sweeteners, and "Sugar-free" Products. This will help patient to have stable blood glucose profile and potentially avoid unintended weight gain.  - I encouraged the patient to switch to unprocessed or minimally processed complex starch and increased protein intake (animal or plant source), fruits, and vegetables.  - Patient is advised to stick to a routine mealtimes to eat 3 meals  a day and avoid unnecessary snacks ( to snack only to correct hypoglycemia).   - I have approached patient with the following individualized plan to manage diabetes and patient agrees:   -  Based on her blood glucose profile and glycemic burden she will continue to need intensive treatment with basal/bolus insulin in order for her to maintain control of diabetes to target.    - She is approached for better dietary engagement.  She is advised to continue Basaglar 90 units daily at bedtime, continue  NovoLog 25  units  3 times a day before meals for pre-meal blood glucose above 90 mg/dL associated with strict monitoring  of glucose 4 times a day-before meals and at bedtime. - She is benefiting from  continued glucose monitoring, is advised to wear it at all times. -Patient is encouraged to call clinic for blood  glucose levels less than 70 or above 300 mg /dl.  -She is advised to continue Januvia 50 mg p.o. daily with breakfast.  - If her renal function continues to improve she will be reconsidered for low-dose metformin on subsequent visits.  - Patient specific target  A1c;  LDL, HDL, Triglycerides, and  Waist Circumference were discussed in detail.  2) BP/HTN: she is advised to home monitor blood pressure and report if > 140/90 on 2 separate readings.   She is advised to continue her current blood pressure medications including Hyzaar.  3) Lipids/HPL: Her recent lipid panel showed controlled LDL at 67.  She is  advised to continue simvastatin 40 mg p.o. nightly.   Side effects and precautions discussed with her.    4) Chronic Care/Health Maintenance:  -Patient is on ARB and Statin medications and encouraged to continue to follow up with Ophthalmology, Podiatrist at least yearly or according to recommendations, and advised to   stay away from smoking. I have recommended yearly flu vaccine and pneumonia vaccination at least every 5 years; and  sleep for at least 7 hours a day.  - I advised patient to maintain close follow up with Richarda BladeElliott, Dianne E for primary care needs.  - Patient Care Time Today:  25 min, of which >50% was spent in reviewing her  current and  previous labs/studies, previous treatments, and medications doses and developing a plan for long-term care based on the latest recommendations for standards of care.  Altamese DillingJudith G Oubre participated in the discussions, expressed understanding, and voiced agreement with the above plans.  All questions were answered to her satisfaction. she is encouraged to contact clinic should she have any questions or concerns prior to her return visit.   Follow up  plan: - Return in about 4 months (around 02/18/2019) for Follow up with Pre-visit Labs, Meter, and Logs.  Marquis LunchGebre , MD Phone: [phone]  Fax: [phone]  This note was partially dictated with voice recognition software. Similar sounding words can be transcribed inadequately or may not  be corrected upon review.  10/19/2018, 5:19 PM

## 2019-01-15 ENCOUNTER — Other Ambulatory Visit: Payer: Self-pay | Admitting: "Endocrinology

## 2019-01-15 DIAGNOSIS — IMO0002 Reserved for concepts with insufficient information to code with codable children: Secondary | ICD-10-CM

## 2019-01-15 DIAGNOSIS — E1122 Type 2 diabetes mellitus with diabetic chronic kidney disease: Secondary | ICD-10-CM

## 2019-02-16 LAB — BASIC METABOLIC PANEL
BUN: 18 (ref 4–21)
Creatinine: 1.4 — AB (ref 0.5–1.1)

## 2019-02-16 LAB — HEMOGLOBIN A1C: Hemoglobin A1C: 7.4

## 2019-02-20 ENCOUNTER — Encounter: Payer: Self-pay | Admitting: "Endocrinology

## 2019-02-20 ENCOUNTER — Ambulatory Visit (INDEPENDENT_AMBULATORY_CARE_PROVIDER_SITE_OTHER): Payer: Medicare Other | Admitting: "Endocrinology

## 2019-02-20 ENCOUNTER — Other Ambulatory Visit: Payer: Self-pay

## 2019-02-20 DIAGNOSIS — N183 Chronic kidney disease, stage 3 (moderate): Secondary | ICD-10-CM

## 2019-02-20 DIAGNOSIS — E1122 Type 2 diabetes mellitus with diabetic chronic kidney disease: Secondary | ICD-10-CM | POA: Diagnosis not present

## 2019-02-20 DIAGNOSIS — I129 Hypertensive chronic kidney disease with stage 1 through stage 4 chronic kidney disease, or unspecified chronic kidney disease: Secondary | ICD-10-CM

## 2019-02-20 DIAGNOSIS — Z794 Long term (current) use of insulin: Secondary | ICD-10-CM

## 2019-02-20 DIAGNOSIS — IMO0002 Reserved for concepts with insufficient information to code with codable children: Secondary | ICD-10-CM

## 2019-02-20 DIAGNOSIS — I10 Essential (primary) hypertension: Secondary | ICD-10-CM

## 2019-02-20 DIAGNOSIS — E1165 Type 2 diabetes mellitus with hyperglycemia: Secondary | ICD-10-CM | POA: Diagnosis not present

## 2019-02-20 DIAGNOSIS — E782 Mixed hyperlipidemia: Secondary | ICD-10-CM

## 2019-02-20 NOTE — Progress Notes (Signed)
02/20/2019                                                     Endocrinology Telehealth Visit Follow up Note -During COVID -19 Pandemic  This visit type was conducted due to national recommendations for restrictions regarding the COVID-19 Pandemic  in an effort to limit this patient's exposure and mitigate transmission of the corona virus.  Due to his co-morbid illnesses, Tiffany Brock is at  moderate to high risk for complications without adequate follow up.  This format is felt to be most appropriate for her at this time.  I connected with this patient on 02/20/2019   by telephone and verified that I am speaking with the correct person using two identifiers. Tiffany Brock, 08-15-1945. she has verbally consented to this visit. All issues noted in this document were discussed and addressed. The format was not optimal for physical exam.    Subjective:    Patient ID: Tiffany Brock, female    DOB: 1945-11-07. Patient is being engaged in telehealth in follow-up for management of type 2 diabetes, hyperlipidemia, hypertension. PCP: Garald Braver  Past Medical History:  Diagnosis Date  . Anemia   . Arthritis   . Diabetes mellitus, type II (HCC)   . Dyspnea    on exertion  . Headache   . Pneumonia    h/o  . PONV (postoperative nausea and vomiting)   . Sleep apnea   . Vertigo    Past Surgical History:  Procedure Laterality Date  . ABDOMINAL HYSTERECTOMY    . APPENDECTOMY    . BLEPHAROPLASTY Bilateral 01/2018  . CARPAL TUNNEL RELEASE Left 2008  . HEMORROIDECTOMY    . UMBILICAL HERNIA REPAIR     Social History   Socioeconomic History  . Marital status: Married    Spouse name: Not on file  . Number of children: Not on file  . Years of education: Not on file  . Highest education level: Not on file  Occupational History  . Not on file  Social Needs  . Financial resource strain: Not on file  . Food insecurity    Worry: Not on file    Inability: Not on file  .  Transportation needs    Medical: Not on file    Non-medical: Not on file  Tobacco Use  . Smoking status: Never Smoker  . Smokeless tobacco: Never Used  Substance and Sexual Activity  . Alcohol use: No    Frequency: Never  . Drug use: No  . Sexual activity: Not on file  Lifestyle  . Physical activity    Days per week: Not on file    Minutes per session: Not on file  . Stress: Not on file  Relationships  . Social Musician on phone: Not on file    Gets together: Not on file    Attends religious service: Not on file    Active member of club or organization: Not on file    Attends meetings of clubs or organizations: Not on file    Relationship status: Not on file  Other Topics Concern  . Not on file  Social History Narrative  . Not on file   Outpatient Encounter Medications as of 02/20/2019  Medication Sig  . albuterol (PROVENTIL HFA;VENTOLIN HFA) 108 (90  Base) MCG/ACT inhaler Inhale 2 puffs into the lungs every 6 (six) hours as needed for wheezing or shortness of breath.  . B-D ULTRAFINE III SHORT PEN 31G X 8 MM MISC USE TO TEST BLOOD SUGAR THREE TIMES DAILY AS DIRECTED  . Continuous Blood Gluc Sensor (FREESTYLE LIBRE SENSOR SYSTEM) MISC Use one sensor every 10 days.  . diclofenac (VOLTAREN) 75 MG EC tablet Take 75 mg by mouth daily as needed for mild pain.  Marland Kitchen. esomeprazole (NEXIUM) 40 MG capsule Take 40 mg by mouth daily.  Marland Kitchen. HYDROcodone-acetaminophen (NORCO/VICODIN) 5-325 MG tablet Take 1-2 tablets by mouth every 4 (four) hours as needed (pain).  . insulin aspart (NOVOLOG) 100 UNIT/ML FlexPen INJECT 25-31 UNITS INTO THE SKIN 3 (THREE) TIMES DAILY WITH MEALS.  Marland Kitchen. Insulin Glargine (BASAGLAR KWIKPEN) 100 UNIT/ML SOPN INJECT 90 UNITS UNDER THE SKIN DAILY AT 10 PM  . JANUVIA 50 MG tablet TAKE 1 TABLET BY MOUTH EVERY DAY (Patient taking differently: Take 50 mg by mouth daily. )  . losartan-hydrochlorothiazide (HYZAAR) 50-12.5 MG tablet Take 1 tablet by mouth daily.  .  methocarbamol (ROBAXIN) 500 MG tablet Take 1 tablet (500 mg total) by mouth every 6 (six) hours as needed for muscle spasms.  . NON FORMULARY CPAP  . PARoxetine (PAXIL) 10 MG tablet Take 10 mg by mouth daily.  . simvastatin (ZOCOR) 40 MG tablet Take 40 mg by mouth daily.  . Vitamin D, Ergocalciferol, (DRISDOL) 50000 units CAPS capsule Take 50,000 Units by mouth every Sunday.    No facility-administered encounter medications on file as of 02/20/2019.    ALLERGIES: Allergies  Allergen Reactions  . Biaxin [Clarithromycin] Hives   VACCINATION STATUS:  There is no immunization history on file for this patient.  Diabetes She presents for her follow-up diabetic visit. She has type 2 diabetes mellitus. Onset time: She was diagnosed at approximate age of 46 years. Her disease course has been improving. There are no hypoglycemic associated symptoms. Pertinent negatives for hypoglycemia include no confusion, headaches, pallor or seizures. Associated symptoms include fatigue. Pertinent negatives for diabetes include no blurred vision, no chest pain, no polydipsia, no polyphagia and no polyuria. There are no hypoglycemic complications. Symptoms are improving. Risk factors for coronary artery disease include diabetes mellitus, dyslipidemia, family history, hypertension, obesity and sedentary lifestyle. Current diabetic treatment includes insulin injections and oral agent (dual therapy). She is following a generally unhealthy diet. When asked about meal planning, she reported none. She has had a previous visit with a dietitian. She never participates in exercise. Her home blood glucose trend is fluctuating minimally. Her breakfast blood glucose range is generally 140-180 mg/dl. Her lunch blood glucose range is generally 140-180 mg/dl. Her dinner blood glucose range is generally 140-180 mg/dl. Her bedtime blood glucose range is generally 180-200 mg/dl. Her overall blood glucose range is 140-180 mg/dl. An ACE  inhibitor/angiotensin II receptor blocker is being taken. She sees a podiatrist.Eye exam is current.  Hyperlipidemia This is a chronic problem. The current episode started more than 1 year ago. The problem is controlled. Exacerbating diseases include diabetes and obesity. Pertinent negatives include no chest pain, myalgias or shortness of breath. Current antihyperlipidemic treatment includes statins. Risk factors for coronary artery disease include diabetes mellitus, dyslipidemia, family history, obesity, hypertension and a sedentary lifestyle.  Hypertension This is a chronic problem. The current episode started more than 1 year ago. Pertinent negatives include no blurred vision, chest pain, headaches, palpitations or shortness of breath. Risk factors for coronary artery disease  include dyslipidemia, diabetes mellitus, family history, obesity and sedentary lifestyle. Past treatments include angiotensin blockers.   Review of systems: Limited as above.   Objective:    There were no vitals taken for this visit.  Wt Readings from Last 3 Encounters:  07/11/18 255 lb (115.7 kg)  07/07/18 255 lb (115.7 kg)  07/04/18 255 lb 12.8 oz (116 kg)     CMP     Component Value Date/Time   NA 140 10/12/2018   K 3.9 10/12/2018   CL 102 07/04/2018 1407   CO2 24 07/04/2018 1407   GLUCOSE 293 (H) 07/04/2018 1407   BUN 18 02/16/2019   CREATININE 1.4 (A) 02/16/2019   CREATININE 1.30 (H) 07/04/2018 1407   CALCIUM 8.9 07/04/2018 1407   GFRNONAA 41 (L) 07/04/2018 1407   GFRAA 48 (L) 07/04/2018 1407   Diabetic Labs (most recent): Lab Results  Component Value Date   HGBA1C 7.4 02/16/2019   HGBA1C 8.1 10/12/2018   HGBA1C 8.5 (H) 07/04/2018    Lipid Panel     Component Value Date/Time   CHOL 146 03/31/2018   TRIG 87 03/31/2018   HDL 54 03/31/2018   LDLCALC 75 03/31/2018     Assessment & Plan:   1. Uncontrolled type 2 diabetes mellitus with stage 3 chronic kidney disease, with long-term current  use of insulin (HCC)  - Patient has currently uncontrolled symptomatic type 2 DM since  73 years of age. - She reports improved glycemic profile and A1c of 7.4% progressively improving from 8.5%.      Her diabetes is complicated by  CKD, obesity , and sedentary life and patient remains at a high risk for more acute and chronic complications of diabetes which include CAD, CVA, CKD, retinopathy, and neuropathy. These are all discussed in detail with the patient.  - I have counseled the patient on diet management and weight loss, by adopting a carbohydrate restricted/protein rich diet.  - she  admits there is a room for improvement in her diet and drink choices. -  Suggestion is made for her to avoid simple carbohydrates  from her diet including Cakes, Sweet Desserts / Pastries, Ice Cream, Soda (diet and regular), Sweet Tea, Candies, Chips, Cookies, Sweet Pastries,  Store Bought Juices, Alcohol in Excess of  1-2 drinks a day, Artificial Sweeteners, Coffee Creamer, and "Sugar-free" Products. This will help patient to have stable blood glucose profile and potentially avoid unintended weight gain.   - I encouraged the patient to switch to unprocessed or minimally processed complex starch and increased protein intake (animal or plant source), fruits, and vegetables.  - Patient is advised to stick to a routine mealtimes to eat 3 meals  a day and avoid unnecessary snacks ( to snack only to correct hypoglycemia).   - I have approached patient with the following individualized plan to manage diabetes and patient agrees:   -  Based on her blood glucose profile and glycemic burden she will continue to need intensive treatment with basal/bolus insulin in order for her to maintain control of diabetes to target.    - She is approached for better dietary engagement.  She is advised to continue Basaglar 90 units at bedtime, continue NovoLog 25 units 3 times a day before meals for pre-meal blood glucose above 90  mg/dL associated with strict monitoring of glucose 4 times a day-before meals and at bedtime. - She is benefiting from  continued glucose monitoring, is advised to wear it at all times. -Patient is encouraged  to call clinic for blood glucose levels less than 70 or above 300 mg /dl.  -She is advised to continue Januvia 50 mg p.o. daily with breakfast.  -Her renal function is not improving, advised to stay away from metformin treatment for now.    - Patient specific target  A1c;  LDL, HDL, Triglycerides, and  Waist Circumference were discussed in detail.  2) BP/HTN: she is advised to home monitor blood pressure and report if > 140/90 on 2 separate readings.   She is advised to continue her current blood pressure medications including Hyzaar.  3) Lipids/HPL: Her recent lipid panel showed controlled LDL at 67.  She is  advised to continue simvastatin 40 mg p.o. nightly.  Side effects and precautions discussed with her.    4) Chronic Care/Health Maintenance:  -Patient is on ARB and Statin medications and encouraged to continue to follow up with Ophthalmology, Podiatrist at least yearly or according to recommendations, and advised to   stay away from smoking. I have recommended yearly flu vaccine and pneumonia vaccination at least every 5 years; and  sleep for at least 7 hours a day.  - I advised patient to maintain close follow up with Ephriam Jenkins E for primary care needs.  - Patient Care Time Today:  25 min, of which >50% was spent in  counseling and the rest reviewing her  current and  previous labs/studies, previous treatments, her blood glucose readings, and medications' doses and developing a plan for long-term care based on the latest recommendations for standards of care.   Maple Odaniel Pires participated in the discussions, expressed understanding, and voiced agreement with the above plans.  All questions were answered to her satisfaction. she is encouraged to contact clinic should she  have any questions or concerns prior to her return visit.   Follow up plan: - Return in about 4 months (around 06/22/2019) for Bring Meter and Logs- A1c in Office, Include 6 log sheets.  Glade Lloyd, MD Phone: [phone]  Fax: [phone]  This note was partially dictated with voice recognition software. Similar sounding words can be transcribed inadequately or may not  be corrected upon review.  02/20/2019, 5:18 PM

## 2019-02-26 ENCOUNTER — Other Ambulatory Visit: Payer: Self-pay | Admitting: "Endocrinology

## 2019-03-13 ENCOUNTER — Other Ambulatory Visit: Payer: Self-pay | Admitting: "Endocrinology

## 2019-03-31 ENCOUNTER — Encounter: Payer: Self-pay | Admitting: Internal Medicine

## 2019-03-31 ENCOUNTER — Ambulatory Visit: Payer: Medicare Other | Admitting: Internal Medicine

## 2019-03-31 ENCOUNTER — Other Ambulatory Visit (INDEPENDENT_AMBULATORY_CARE_PROVIDER_SITE_OTHER): Payer: Medicare Other

## 2019-03-31 VITALS — BP 106/76 | HR 76 | Temp 98.1°F | Ht 65.0 in | Wt 257.0 lb

## 2019-03-31 DIAGNOSIS — K582 Mixed irritable bowel syndrome: Secondary | ICD-10-CM

## 2019-03-31 DIAGNOSIS — R198 Other specified symptoms and signs involving the digestive system and abdomen: Secondary | ICD-10-CM

## 2019-03-31 DIAGNOSIS — K6289 Other specified diseases of anus and rectum: Secondary | ICD-10-CM | POA: Diagnosis not present

## 2019-03-31 DIAGNOSIS — D509 Iron deficiency anemia, unspecified: Secondary | ICD-10-CM

## 2019-03-31 DIAGNOSIS — R159 Full incontinence of feces: Secondary | ICD-10-CM

## 2019-03-31 LAB — FERRITIN: Ferritin: 50.3 ng/mL (ref 10.0–291.0)

## 2019-03-31 LAB — CBC WITH DIFFERENTIAL/PLATELET
Basophils Absolute: 0 10*3/uL (ref 0.0–0.1)
Basophils Relative: 0.2 % (ref 0.0–3.0)
Eosinophils Absolute: 0.2 10*3/uL (ref 0.0–0.7)
Eosinophils Relative: 2.2 % (ref 0.0–5.0)
HCT: 33 % — ABNORMAL LOW (ref 36.0–46.0)
Hemoglobin: 11.6 g/dL — ABNORMAL LOW (ref 12.0–15.0)
Lymphocytes Relative: 24.4 % (ref 12.0–46.0)
Lymphs Abs: 1.7 10*3/uL (ref 0.7–4.0)
MCHC: 35 g/dL (ref 30.0–36.0)
MCV: 86.2 fl (ref 78.0–100.0)
Monocytes Absolute: 0.4 10*3/uL (ref 0.1–1.0)
Monocytes Relative: 5.8 % (ref 3.0–12.0)
Neutro Abs: 4.7 10*3/uL (ref 1.4–7.7)
Neutrophils Relative %: 67.4 % (ref 43.0–77.0)
Platelets: 151 10*3/uL (ref 150.0–400.0)
RBC: 3.83 Mil/uL — ABNORMAL LOW (ref 3.87–5.11)
RDW: 13.3 % (ref 11.5–15.5)
WBC: 7 10*3/uL (ref 4.0–10.5)

## 2019-03-31 LAB — IGA: IgA: 440 mg/dL — ABNORMAL HIGH (ref 68–378)

## 2019-03-31 NOTE — Patient Instructions (Addendum)
Your provider has requested that you go to the basement level for lab work before leaving today. Press "B" on the elevator. The lab is located at the first door on the left as you exit the elevator.  Try to cut back on your use of Loperamide.   Use Benefiber daily, handout provided.   Dr Carlean Purl recommends that you complete a bowel purge (to clean out your bowels). Please do the following: Purchase a bottle of Miralax over the counter as well as a box of 5 mg dulcolax tablets. Take 4 dulcolax tablets. Wait 1 hour. You will then drink 6-8 capfuls of Miralax mixed in an adequate amount of water/juice/gatorade (you may choose which of these liquids to drink) over the next 2-3 hours. You should expect results within 1 to 6 hours after completing the bowel purge.   You have been given a testing kit to check for small intestine bacterial overgrowth (SIBO) which is completed by a company named [employer]. Make sure to return your test in the mail using the return mailing label given you along with the kit. Your demographic and insurance information have already been sent to the company and they should be in contact with you over the next week regarding this test. Please keep in mind that you will be getting a call from phone number [phone] or a similar number. If you do not hear from them within this time frame, please call our office at [phone].   We are referring you for pelvic floor physical therapy. They will contact you about making an appointment.   Please follow up with Dr Carlean Purl in 6 weeks.   I appreciate the opportunity to care for you. Silvano Rusk, MD, [facility]

## 2019-03-31 NOTE — Progress Notes (Signed)
Tiffany DillingJudith G Brock 73 y.o. 04-29-1946 [id number]  Assessment & Plan:   Encounter Diagnoses  Name Primary?  . Iron deficiency anemia, unspecified iron deficiency anemia type Yes  . Abnormal defecation   . Decreased anal sphincter tone   . Irritable bowel syndrome with both constipation and diarrhea   . Full incontinence of feces     This a very complicated picture, longstanding iron deficiency anemia and previous work-up without source make me reluctant to recommend additional endoscopic evaluations.  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  The question of some inflammatory changes raises the question of IBD and a question of celiac disease was raised.  I do not think she has IBD though I cannot say that with certainty.  However clinical scenario I do not think fits with that.  She has significant anorectal dysfunction and probably irritable bowel syndrome.  Hemorrhoid surgery has aggravated defecation patterns and led to fecal incontinence and rectal exam suggests abnormal sphincter tone and function of the anorectum.   I am going to repeat a CBC and ferritin and do celiac screening.  She will undergo a small intestinal bacterial overgrowth test with a lactulose hydrogen breath test to see if that is playing a role.  She will do a MiraLAX purge to for a bowel cleanse, and then use Benefiber 1 to 2 tablespoons daily  She needs to try to stop using loperamide.  My Nelva Bushtheory is that if she can have more effective defecation each day or most days then she will not have the fecal incontinence  She will be referred to pelvic floor physical therapy to help with that disordered defecation and fecal incontinence issues  Follow-up in 6 weeks for reassessment  I think some of her hemoglobin decline could be an anemia of chronic disease issue  Lab Results  Component Value Date   WBC 7.0 03/31/2019   HGB 11.6 (L) 03/31/2019   HCT  33.0 (L) 03/31/2019   MCV 86.2 03/31/2019   PLT 151.0 03/31/2019   Lab Results  Component Value Date   FERRITIN 50.3 03/31/2019    F/U 6 weeks  I appreciate the opportunity to care for this patient. CC: Richarda BladeElliott, Dianne E  Dr. Francisca DecemberAnkit Madan  Subjective:   Chief Complaint: anemia, also has fecal incontine   HPI The patient is a 73 year old white woman accompanied by her husband here at the request of Dr. Francisca DecemberAnkit Madan of the [facility] regarding iron deficiency anemia and wondering about having a colonoscopy.  The patient has a longstanding history of iron deficiency anemia with previous evaluations by Dr. Calvert CantorSpainhaur that have included multiple upper endoscopies colonoscopy and small bowel capsule endoscopy.  Records reviewed revealed that she has had some small bowel angiodysplasia, found on capsule endoscopy.  She has had some nonspecific ileal erosions on colonoscopy in the past.  Her last colonoscopy was in 2014 and 1 was 1 of multiple over the years for work-up of iron deficiency anemia.  EGD about that time as well as capsule endoscopy.  She was thought to have a gastroparesis as well and for a while was treated with metoclopramide.  Gastric emptying scan 2 hours type was abnormal.  She is no longer on metoclopramide and does not have problems with that.  She does have a history of chronic parenteral iron therapy which has worked.  More recently she had back surgery and had a decline in  her hemoglobin, the spine surgery by Dr. Sherwood Gambler was successful, but her hematologist was concerned because of this dip that she might be bleeding.  Hemoglobin went to 12 after IV iron and then it went to 10.9 in August compared to May.  She does have a history of chronic bowel irregularities.  I should note that her ferritin was normal at 87 when she had a hemoglobin of 10.9.  The bowel irregularities are associated with incontinence, because of fear of leakage of stool that became a problem after  hemorrhoidectomy 4 years ago she uses Imodium very frequently so she does not have fecal incontinence.  She is fearful to leave the house if she does not take that.  She has tried MiraLAX in the past but could not get regulated and still had these problems.  She does not describe rectal bleeding..  She has a bowel movement about every 3 days but she is using the loperamide.  She has to wear a pad most of the time.  Eventually she will unload with numerous bowel movements.  No significant abdominal cramps.  She does feel sort of washed out on those days where she has numerous bowel movements.   Colonoscopy in 2014 did show extensive sigmoid diverticulosis.  Capsule endoscopy demonstrated jejunal erosions and questionable scattered areas consistent with villous atrophy.  No history of celiac testing.  EGD in 8119 with uncomplicated hiatus hernia.  EGD and colonoscopy and PillCam 2012 with diverticulosis on the colonoscopy, hiatus hernia on the EGD and mild ileal erosions on PillCam.  On her diagnosis lists AVMs of the small bowel, though that was not mentioned in the description of the capsule endoscopy report.  Question if that was a typographical error.  Or transcriptional error.  The patient cannot help me on this.  Cross-sectional imaging including CT scanning of the abdomen pelvis in recent years without abnormalities related to any type of anemia.  Allergies  Allergen Reactions  . Biaxin [Clarithromycin] Hives   Current Meds  Medication Sig  . albuterol (PROVENTIL HFA;VENTOLIN HFA) 108 (90 Base) MCG/ACT inhaler Inhale 2 puffs into the lungs every 6 (six) hours as needed for wheezing or shortness of breath.  . B-D ULTRAFINE III SHORT PEN 31G X 8 MM MISC USE AS DIRECTED  . Continuous Blood Gluc Sensor (FREESTYLE LIBRE SENSOR SYSTEM) MISC Use one sensor every 10 days.  Marland Kitchen esomeprazole (NEXIUM) 40 MG capsule Take 40 mg by mouth daily.  . furosemide (LASIX) 20 MG tablet Take 20 mg by mouth.  .  gabapentin (NEURONTIN) 300 MG capsule Take 300 mg by mouth at bedtime. 3 tablets at bedtime  . insulin aspart (NOVOLOG) 100 UNIT/ML FlexPen INJECT 25-31 UNITS INTO THE SKIN 3 (THREE) TIMES DAILY WITH MEALS.  Marland Kitchen Insulin Glargine (BASAGLAR KWIKPEN) 100 UNIT/ML SOPN INJECT 90 UNITS UNDER THE SKIN DAILY AT 10 PM  . losartan-hydrochlorothiazide (HYZAAR) 50-12.5 MG tablet Take 1 tablet by mouth daily.  . NON FORMULARY CPAP  . PARoxetine (PAXIL) 10 MG tablet Take 10 mg by mouth daily.  . simvastatin (ZOCOR) 40 MG tablet Take 40 mg by mouth daily.  . sitaGLIPtin (JANUVIA) 50 MG tablet Take 1 tablet (50 mg total) by mouth daily.  . Vitamin D, Ergocalciferol, (DRISDOL) 50000 units CAPS capsule Take 50,000 Units by mouth every Sunday.    Past Medical History:  Diagnosis Date  . Anemia   . Arthritis   . Diabetes mellitus, type II ([place])   . Dyspnea    on  exertion  . Headache   . Pneumonia    h/o  . PONV (postoperative nausea and vomiting)   . Sleep apnea   . Vertigo    Past Surgical History:  Procedure Laterality Date  . ABDOMINAL HYSTERECTOMY    . APPENDECTOMY    . BLEPHAROPLASTY Bilateral 01/2018  . CARPAL TUNNEL RELEASE Left 2008  . HEMORROIDECTOMY    . UMBILICAL HERNIA REPAIR     Social History   Social History Narrative  . Not on file   family history includes Heart disease in her brother.   Review of Systems Recent Covid teste negative prior to family vacation  Objective:   Physical Exam @BP  106/76 (BP Location: Left Arm, Patient Position: Sitting, Cuff Size: Large)   Pulse 76   Temp 98.1 F (36.7 C)   Ht 5\' 5"  (1.651 m)   Wt 257 lb (116.6 kg)   SpO2 99%   BMI 42.77 kg/m @  General:  Morbidly obese Well-developed, well-nourished and in no acute distress Eyes:  anicteric. ENT:   Mouth and posterior pharynx free of lesions.  Neck:   supple w/o thyromegaly or mass.  Lungs: Clear to auscultation bilaterally. Heart:  S1S2, no rubs, murmurs, gallops. Abdomen:   obesesoft, non-tender, no hepatosplenomegaly, hernia, or mass and BS+.  Rectal:  Patti , CMA present.   Anoderm inspection revealed small fleshy tags, no rash Anal wink was absent Digital exam revealed large amount of formed stool - firm slightly decreased resting tone and more reduced voluntary squeeze. Small rectocele present. Simulated defecation with valsalva revealed appropriate abdominal contraction with transient anal contraction then rleaxation (reduced) and reduceddescent.    Lymph:  no cervical or supraclavicular adenopathy. Extremities:   no edema, cyanosis or clubbing Skin   no rash. Neuro:  A&O x 3.  Psych:  appropriate mood and  Affect.   Data Reviewed: See HPI, review of extensive hematology notes GI notes from 2014 to the present including labs and endoscopic evaluations.

## 2019-04-03 LAB — TISSUE TRANSGLUTAMINASE, IGA: (tTG) Ab, IgA: 1 U/mL

## 2019-04-04 ENCOUNTER — Telehealth: Payer: Self-pay | Admitting: Internal Medicine

## 2019-04-04 NOTE — Telephone Encounter (Signed)
No answer and VM not set up.  Will try and call back later

## 2019-04-04 NOTE — Telephone Encounter (Signed)
Pt requested a call back regarding "a prep that she had to do on Saturday."

## 2019-04-07 ENCOUNTER — Telehealth: Payer: Self-pay | Admitting: Internal Medicine

## 2019-04-07 NOTE — Telephone Encounter (Signed)
Attempted to call back again.  No answer/ VM .  Will await a call back from the patient.

## 2019-04-18 ENCOUNTER — Ambulatory Visit: Payer: Medicare Other | Admitting: Physical Therapy

## 2019-04-21 NOTE — Progress Notes (Signed)
Pleas tell her she does not have celiac disease = gluten allergy

## 2019-04-26 ENCOUNTER — Other Ambulatory Visit: Payer: Self-pay

## 2019-04-26 ENCOUNTER — Ambulatory Visit: Payer: Medicare Other | Attending: Internal Medicine | Admitting: Physical Therapy

## 2019-04-26 ENCOUNTER — Encounter: Payer: Self-pay | Admitting: Physical Therapy

## 2019-04-26 DIAGNOSIS — M6281 Muscle weakness (generalized): Secondary | ICD-10-CM

## 2019-04-26 DIAGNOSIS — R293 Abnormal posture: Secondary | ICD-10-CM | POA: Diagnosis present

## 2019-04-26 DIAGNOSIS — R279 Unspecified lack of coordination: Secondary | ICD-10-CM | POA: Diagnosis present

## 2019-04-26 NOTE — Patient Instructions (Addendum)
Access Code: [email or web address]  URL: [email or web address]  Date: 04/26/2019  Prepared by: Jari Favre   Exercises  Sidelying Thoracic Rotation - 10 reps - 3 sets - 1x daily - 7x weekly  Supine Hip Adductor Squeeze with Small Ball - 10 reps - 1 sets - 3 sec hold - 3x daily - 7x weekly  Sidelying Clamshell with Pelvic Floor Contraction - 10 reps - 3 sets - 1x daily - 7x weekly  Sit to Stand with Pelvic Floor Contraction - 10 reps - 1 sets - 1x daily - 7x weekly  Seated Hamstring Stretch - 10 reps - 3 sets - 1x daily - 7x weekly

## 2019-04-26 NOTE — Therapy (Signed)
[facility] [address], [place], [place], [address] Phone: [phone]   Fax:  [phone]  Physical Therapy Evaluation  Patient Details  Name: Tiffany Brock MRN: [id number] Date of Birth: Aug 31, 1945 Referring Provider (PT): Gatha Mayer, MD   Encounter Date: 04/26/2019  PT End of Session - 04/26/19 1324    Visit Number  1    Date for PT Re-Evaluation  07/19/19    PT Start Time  1316    PT Stop Time  1400    PT Time Calculation (min)  44 min    Activity Tolerance  Patient tolerated treatment well    Behavior During Therapy  [facility] for tasks assessed/performed       Past Medical History:  Diagnosis Date  . Anemia   . Arthritis   . Diabetes mellitus, type II ([place])   . Dyspnea    on exertion  . Headache   . Pneumonia    h/o  . PONV (postoperative nausea and vomiting)   . Sleep apnea   . Vertigo     Past Surgical History:  Procedure Laterality Date  . ABDOMINAL HYSTERECTOMY    . APPENDECTOMY    . BLEPHAROPLASTY Bilateral 01/2018  . CARPAL TUNNEL RELEASE Left 2008  . HEMORROIDECTOMY    . UMBILICAL HERNIA REPAIR      There were no vitals filed for this visit.   Subjective Assessment - 04/26/19 1318    Subjective  Pt has suspected IBS or other colon issue.  States every since the surgery for hemorrhoid, she has had difficulty holding BM.  States she has to wear pads all the time and sometimes leakage that goes all over her clothes.  I feel like I can't go anywhere.    Pertinent History  hemorrhoid surger 2017    Limitations  Walking;Standing    Currently in Pain?  No/denies         [facility] PT Assessment - 04/26/19 0001      Assessment   Medical Diagnosis  R19.8 (ICD-10-CM) - Abnormal defecation; K62.89 (ICD-10-CM) - Decreased anal sphincter tone    Referring Provider (PT)  Gatha Mayer, MD    Prior Therapy  No      Precautions   Precautions  None      Restrictions   Weight Bearing  Restrictions  No      Balance Screen   Has the patient fallen in the past 6 months  No      [place] residence    Living Arrangements  Spouse/significant other      Prior Function   Level of Independence  Independent    Leisure  walking      Cognition   Overall Cognitive Status  Within Functional Limits for tasks assessed      Posture/Postural Control   Posture/Postural Control  Postural limitations    Postural Limitations  Rounded Shoulders;Forward head      ROM / Strength   AROM / PROM / Strength  PROM;Strength      PROM   Overall PROM Comments  hip ER limited 60%, flexion limited soft end feel      Strength   Overall Strength Comments  bilateral hip abdcution and adduction  4+/5; core 2/5 cannot lift shoulders      Flexibility   Soft Tissue Assessment /Muscle Length  yes    Hamstrings  30% limited      Ambulation/Gait  Gait Pattern  Decreased stride length                Objective measurements completed on examination: See above findings.    Pelvic Floor Special Questions - 04/26/19 0001    Prior Pelvic/Prostate Exam  Yes    Urinary Leakage  No    Urinary urgency  No    Fecal incontinence  Yes    Skin Integrity  Hemorroids    Pelvic Floor Internal Exam  pt identity confirmed and consent given for internal soft tissue assessment    Exam Type  Rectal    Palpation  low tone of sphincter muscles    Strength  weak squeeze, no lift    Strength # of reps  1    Strength # of seconds  5    Tone  low       [facility] Adult PT Treatment/Exercise - 04/26/19 0001      Self-Care   Self-Care  Other Self-Care Comments    Other Self-Care Comments   educated and performed initial HEP             PT Education - 04/26/19 1355    Education Details  Access Code: [email or web address] URL: [email or web address] Date: 04/26/2019 Prepared by: Dwana CurdJacqueline Alder Murri  Exercises Sidelying Thoracic Rotation - 10 reps - 3 sets - 1x  daily - 7x weekly Supine Hip Adductor Squeeze with Small Ball - 10 reps - 1 sets - 3 sec hold - 3x daily - 7x weekly Sidelying Clamshell with Pelvic Floor Contraction - 10 reps - 3 sets - 1x daily - 7x weekly Sit to Stand with Pelvic Floor Contraction - 10 reps - 1 sets - 1x daily - 7x weekly Seated Hamstring Stretch - 10 reps - 3 sets - 1x daily - 7x weekly       PT Short Term Goals - 04/26/19 1415      PT SHORT TERM GOAL #1   Title  ind with initial HEP    Time  4    Period  Weeks    Status  New    Target Date  06/21/19      PT SHORT TERM GOAL #2   Title  pt will report improved bowel consistency that is more firm    Time  4    Period  Weeks    Status  New    Target Date  05/24/19        PT Long Term Goals - 04/26/19 1424      PT LONG TERM GOAL #1   Title  pt will report she has not had any incontinence that gets beyond the pad    Time  12    Period  Weeks    Status  New    Target Date  07/19/19      PT LONG TERM GOAL #2   Title  Pt will report 50% less frequency of incontinence    Time  12    Period  Weeks    Status  New    Target Date  07/19/19      PT LONG TERM GOAL #3   Title  Pt will demonstrate 3/5 MMT of anal sphincters and be able to hold for at least 15 seconds for improved bowel control    Time  12    Period  Weeks    Status  New    Target Date  07/19/19      PT LONG TERM GOAL #4  Title  Pt will be able to perform transfers correctly without valsava maneuver or holding her breath.    Time  12    Period  Weeks    Status  New    Target Date  07/19/19             Plan - 04/26/19 1400    Clinical Impression Statement  Pt presents to skilled PT due to fecal incontinence that she has had for about 3 years.  She is pleasant 73 y/o female who is motivated to regain strength as well as lose weight as she acknowleges this is contributing to the problem.  States she is unable to hold it in and has to wear pads all the time.  Pt has increased thoracic  kyphosis and forward head posture.  Pt has tight hamstring and limited hip ROM bilaterally.  Pt demonstrates 2/5 core strength unable to lift her shoulders off the mat. pt has hip weakness bilaterally.  Pt has 2/5 MMT of anal sphincter muscles due to weakness of squeeze and it is very easily broken.  Pt can hold contraction for 5 seconds only one time and then strength gets weaker.  Pt will benefit form skilled PT to address these impairments so she can improve activity level and quality of life.    Personal Factors and Comorbidities  Comorbidity 3+;Fitness    Comorbidities  lumbar sugery, hemorrhoid surgery, obesity    Examination-Activity Limitations  Toileting;Continence;Stand    Stability/Clinical Decision Making  Evolving/Moderate complexity    Clinical Decision Making  Moderate    Rehab Potential  Excellent    PT Frequency  1x / week    PT Duration  12 weeks    PT Treatment/Interventions  ADLs/Self Care Home Management;Biofeedback;Cryotherapy;Electrical Stimulation;Moist Heat;Therapeutic activities;Therapeutic exercise;Neuromuscular re-education;Patient/family education;Manual techniques;Taping;Dry needling;Passive range of motion    PT Next Visit Plan  biofeedback anal sphincters, f/u on initial HEP, posture, core, exhale on exertion    PT Home Exercise Plan  Access Code: [email or web address]    Consulted and Agree with Plan of Care  Patient       Patient will benefit from skilled therapeutic intervention in order to improve the following deficits and impairments:  Improper body mechanics, Postural dysfunction, Impaired flexibility, Decreased coordination, Decreased range of motion, Decreased strength  Visit Diagnosis: Muscle weakness (generalized)  Unspecified lack of coordination  Abnormal posture     Problem List Patient Active Problem List   Diagnosis Date Noted  . Lumbar stenosis with neurogenic claudication 07/11/2018  . Uncontrolled type 2 diabetes mellitus with stage 3 chronic  kidney disease, with long-term current use of insulin ([facility]) 01/24/2016  . Mixed hyperlipidemia 01/24/2016  . Essential hypertension, benign 01/24/2016  . Morbid obesity due to excess calories ([facility]) 01/24/2016    Junious Silk, PT 04/26/2019, 2:37 PM  [facility] [address] [place], [place], [address] Phone: [phone]   Fax:  [phone]  Name: KAILYN VANDERSLICE MRN: [id number] Date of Birth: 1946/03/22

## 2019-05-03 ENCOUNTER — Other Ambulatory Visit: Payer: Self-pay

## 2019-05-03 ENCOUNTER — Ambulatory Visit: Payer: Medicare Other | Admitting: Physical Therapy

## 2019-05-03 DIAGNOSIS — M6281 Muscle weakness (generalized): Secondary | ICD-10-CM | POA: Diagnosis not present

## 2019-05-03 DIAGNOSIS — R293 Abnormal posture: Secondary | ICD-10-CM

## 2019-05-03 DIAGNOSIS — R279 Unspecified lack of coordination: Secondary | ICD-10-CM

## 2019-05-03 NOTE — Therapy (Signed)
[facility] [address] [place], [place], [address] Phone: [phone]   Fax:  [phone]  Physical Therapy Treatment  Patient Details  Name: Tiffany Brock MRN: [id number] Date of Birth: 1946/04/12 Referring Provider (PT): Iva Boop, MD   Encounter Date: 05/03/2019  PT End of Session - 05/03/19 1512    Visit Number  2    Date for PT Re-Evaluation  07/19/19    PT Start Time  1316    PT Stop Time  1358    PT Time Calculation (min)  42 min    Activity Tolerance  Patient tolerated treatment well    Behavior During Therapy  [facility] for tasks assessed/performed       Past Medical History:  Diagnosis Date  . Anemia   . Arthritis   . Diabetes mellitus, type II (HCC)   . Dyspnea    on exertion  . Headache   . Pneumonia    h/o  . PONV (postoperative nausea and vomiting)   . Sleep apnea   . Vertigo     Past Surgical History:  Procedure Laterality Date  . ABDOMINAL HYSTERECTOMY    . APPENDECTOMY    . BLEPHAROPLASTY Bilateral 01/2018  . CARPAL TUNNEL RELEASE Left 2008  . HEMORROIDECTOMY    . UMBILICAL HERNIA REPAIR      There were no vitals filed for this visit.  Subjective Assessment - 05/03/19 1320    Subjective  I had one episod of leakage where there was something on the pad.  Other times I have been having about 3x/day leakage that is not enough to get on the pad    Pertinent History  hemorrhoid surger 2017    Limitations  Walking;Standing    Currently in Pain?  No/denies                       [facility] Adult PT Treatment/Exercise - 05/03/19 0001      Neuro Re-ed    Neuro Re-ed Details   pelvic floor activation and coordination with breathing out      Exercises   Exercises  Lumbar      Lumbar Exercises: Supine   AB Set Limitations  kegel and hold in supine - attempt to hold for 5-10 sec feeling for when muscle stops holding - rest 10 sec    Clam Limitations  red band - 20x  with kegel and cues to lift   tactile cues to check     Manual Therapy   Manual Therapy  Myofascial release    Manual therapy comments  supine on wedge pillow    Myofascial Release  fascial release of ascending/descending colon; bladder, vaginal canal, rectum               PT Short Term Goals - 04/26/19 1415      PT SHORT TERM GOAL #1   Title  ind with initial HEP    Time  4    Period  Weeks    Status  New    Target Date  06/21/19      PT SHORT TERM GOAL #2   Title  pt will report improved bowel consistency that is more firm    Time  4    Period  Weeks    Status  New    Target Date  05/24/19        PT Long Term Goals - 04/26/19 1424  PT LONG TERM GOAL #1   Title  pt will report she has not had any incontinence that gets beyond the pad    Time  12    Period  Weeks    Status  New    Target Date  07/19/19      PT LONG TERM GOAL #2   Title  Pt will report 50% less frequency of incontinence    Time  12    Period  Weeks    Status  New    Target Date  07/19/19      PT LONG TERM GOAL #3   Title  Pt will demonstrate 3/5 MMT of anal sphincters and be able to hold for at least 15 seconds for improved bowel control    Time  12    Period  Weeks    Status  New    Target Date  07/19/19      PT LONG TERM GOAL #4   Title  Pt will be able to perform transfers correctly without valsava maneuver or holding her breath.    Time  12    Period  Weeks    Status  New    Target Date  07/19/19            Plan - 05/03/19 1513    Clinical Impression Statement  Pt had good release with myofascial release today in all areas released.  Pt was lifting pelvic floor correctly as checked using external palpation.  She was able to perform pelvic contraction with exhale.  Pt only had one leakage that go to the pad this past week and stool was more liquid.  pt will continue to benefit from skilled PT to work on core and pelvic floor strength and coordination    PT  Treatment/Interventions  ADLs/Self Care Home Management;Biofeedback;Cryotherapy;Electrical Stimulation;Moist Heat;Therapeutic activities;Therapeutic exercise;Neuromuscular re-education;Patient/family education;Manual techniques;Taping;Dry needling;Passive range of motion    PT Next Visit Plan  biofeedback anal sphincters, f/u on clam and exhale with squeeze HEP, posture, core strength    PT Home Exercise Plan  Access Code: [email or web address]    Consulted and Agree with Plan of Care  Patient       Patient will benefit from skilled therapeutic intervention in order to improve the following deficits and impairments:  Improper body mechanics, Postural dysfunction, Impaired flexibility, Decreased coordination, Decreased range of motion, Decreased strength  Visit Diagnosis: Muscle weakness (generalized)  Unspecified lack of coordination  Abnormal posture     Problem List Patient Active Problem List   Diagnosis Date Noted  . Lumbar stenosis with neurogenic claudication 07/11/2018  . Uncontrolled type 2 diabetes mellitus with stage 3 chronic kidney disease, with long-term current use of insulin ([place]) 01/24/2016  . Mixed hyperlipidemia 01/24/2016  . Essential hypertension, benign 01/24/2016  . Morbid obesity due to excess calories ([place]) 01/24/2016    Jule Ser, PT 05/03/2019, 3:17 PM  [facility] [address] [place], [place], [address] Phone: [phone]   Fax:  [phone]  Name: Tiffany Brock MRN: [id number] Date of Birth: [birth year]

## 2019-05-10 ENCOUNTER — Ambulatory Visit: Payer: Medicare Other | Admitting: Physical Therapy

## 2019-05-12 ENCOUNTER — Ambulatory Visit: Payer: Medicare Other | Admitting: Internal Medicine

## 2019-05-15 ENCOUNTER — Ambulatory Visit: Payer: Medicare Other | Admitting: Physical Therapy

## 2019-05-15 ENCOUNTER — Other Ambulatory Visit: Payer: Self-pay

## 2019-05-15 ENCOUNTER — Encounter: Payer: Self-pay | Admitting: Physical Therapy

## 2019-05-15 DIAGNOSIS — R279 Unspecified lack of coordination: Secondary | ICD-10-CM

## 2019-05-15 DIAGNOSIS — M6281 Muscle weakness (generalized): Secondary | ICD-10-CM

## 2019-05-15 DIAGNOSIS — R293 Abnormal posture: Secondary | ICD-10-CM

## 2019-05-15 NOTE — Patient Instructions (Signed)
Updates to Access Code: [email or web address]

## 2019-05-15 NOTE — Therapy (Signed)
[facility] [address] [place], [place], [address] Phone: [phone]   Fax:  [phone]  Physical Therapy Treatment  Patient Details  Name: Tiffany Brock MRN: [id number] Date of Birth: 1945/08/21 Referring Provider (PT): Gatha Mayer, MD   Encounter Date: 05/15/2019  PT End of Session - 05/15/19 1134    Visit Number  3    Date for PT Re-Evaluation  07/19/19    PT Start Time  1103    PT Stop Time  1143    PT Time Calculation (min)  40 min    Activity Tolerance  Patient tolerated treatment well    Behavior During Therapy  [facility] for tasks assessed/performed       Past Medical History:  Diagnosis Date  . Anemia   . Arthritis   . Diabetes mellitus, type II ([place])   . Dyspnea    on exertion  . Headache   . Pneumonia    h/o  . PONV (postoperative nausea and vomiting)   . Sleep apnea   . Vertigo     Past Surgical History:  Procedure Laterality Date  . ABDOMINAL HYSTERECTOMY    . APPENDECTOMY    . BLEPHAROPLASTY Bilateral 01/2018  . CARPAL TUNNEL RELEASE Left 2008  . HEMORROIDECTOMY    . UMBILICAL HERNIA REPAIR      There were no vitals filed for this visit.  Subjective Assessment - 05/15/19 1104    Subjective  I think the exercises are making difference.  There hasn't been much leakage.    Currently in Pain?  No/denies                       [facility] Adult PT Treatment/Exercise - 05/15/19 0001      Lumbar Exercises: Aerobic   Nustep  L1 x 6 min - cues to engage core and pelvic floor      Lumbar Exercises: Standing   Other Standing Lumbar Exercises  hip flexion - kegel with toe touch      Lumbar Exercises: Seated   Long Arc Quad on Chair  Strengthening;Both;20 reps    LAQ on Chair Limitations  ball squeeze    Sit to Stand  10 reps   exhale with standing   Other Seated Lumbar Exercises  green band clam      Lumbar Exercises: Supine   Clam Limitations  green band - 20x      Lumbar Exercises: Sidelying   Clam  Left;10 reps    Clam Limitations  green             PT Education - 05/15/19 1133    Education Details  Access Code: [email or web address]    Person(s) Educated  Patient    Methods  Explanation;Demonstration;Verbal cues;Handout;Tactile cues    Comprehension  Verbalized understanding;Returned demonstration       PT Short Term Goals - 05/15/19 1104      PT SHORT TERM GOAL #1   Title  ind with initial HEP    Status  Achieved      PT SHORT TERM GOAL #2   Title  pt will report improved bowel consistency that is more firm    Baseline  more firm    Status  Achieved        PT Long Term Goals - 04/26/19 1424      PT LONG TERM GOAL #1   Title  pt will report she has not had any incontinence  that gets beyond the pad    Time  12    Period  Weeks    Status  New    Target Date  07/19/19      PT LONG TERM GOAL #2   Title  Pt will report 50% less frequency of incontinence    Time  12    Period  Weeks    Status  New    Target Date  07/19/19      PT LONG TERM GOAL #3   Title  Pt will demonstrate 3/5 MMT of anal sphincters and be able to hold for at least 15 seconds for improved bowel control    Time  12    Period  Weeks    Status  New    Target Date  07/19/19      PT LONG TERM GOAL #4   Title  Pt will be able to perform transfers correctly without valsava maneuver or holding her breath.    Time  12    Period  Weeks    Status  New    Target Date  07/19/19            Plan - 05/15/19 1135    Clinical Impression Statement  Pt met short term goals.  She is doing well with pelvic floor strengthening and her stool consistency has been better.  Pt was able to progress strengthening in sitting and standing today.  Pt will benefit from skilled PT to continue to work on strength for improved functional outcomes.    PT Treatment/Interventions  ADLs/Self Care Home Management;Biofeedback;Cryotherapy;Electrical Stimulation;Moist Heat;Therapeutic  activities;Therapeutic exercise;Neuromuscular re-education;Patient/family education;Manual techniques;Taping;Dry needling;Passive range of motion    PT Next Visit Plan  biofeedback anal sphincters, f/u on clam and exhale with squeeze HEP, posture, core strength, progress    PT Home Exercise Plan  Access Code: [email or web address]    Consulted and Agree with Plan of Care  Patient       Patient will benefit from skilled therapeutic intervention in order to improve the following deficits and impairments:  Improper body mechanics, Postural dysfunction, Impaired flexibility, Decreased coordination, Decreased range of motion, Decreased strength  Visit Diagnosis: No diagnosis found.     Problem List Patient Active Problem List   Diagnosis Date Noted  . Lumbar stenosis with neurogenic claudication 07/11/2018  . Uncontrolled type 2 diabetes mellitus with stage 3 chronic kidney disease, with long-term current use of insulin ([place]) 01/24/2016  . Mixed hyperlipidemia 01/24/2016  . Essential hypertension, benign 01/24/2016  . Morbid obesity due to excess calories ([place]) 01/24/2016    Jule Ser, PT 05/15/2019, 12:55 PM  [facility] [address] [place], [place], [address] Phone: [phone]   Fax:  [phone]  Name: Tiffany Brock MRN: [id number] Date of Birth: 1946-04-29

## 2019-05-24 ENCOUNTER — Other Ambulatory Visit: Payer: Self-pay

## 2019-05-24 ENCOUNTER — Ambulatory Visit: Payer: Medicare Other | Attending: Internal Medicine | Admitting: Physical Therapy

## 2019-05-24 ENCOUNTER — Encounter: Payer: Self-pay | Admitting: Physical Therapy

## 2019-05-24 DIAGNOSIS — M6281 Muscle weakness (generalized): Secondary | ICD-10-CM | POA: Insufficient documentation

## 2019-05-24 DIAGNOSIS — R293 Abnormal posture: Secondary | ICD-10-CM | POA: Diagnosis present

## 2019-05-24 DIAGNOSIS — R279 Unspecified lack of coordination: Secondary | ICD-10-CM | POA: Insufficient documentation

## 2019-05-24 NOTE — Therapy (Signed)
[facility] [address] [place], [place], [address] Phone: [phone]   Fax:  [phone]  Physical Therapy Treatment  Patient Details  Name: Tiffany Brock MRN: [id number] Date of Birth: [birth year] Referring Provider (PT): Gatha Mayer, MD   Encounter Date: 05/24/2019  PT End of Session - 05/24/19 1313    Visit Number  4    Date for PT Re-Evaluation  07/19/19    PT Start Time  1324    PT Stop Time  1358    PT Time Calculation (min)  45 min    Activity Tolerance  Patient tolerated treatment well    Behavior During Therapy  [facility] for tasks assessed/performed       Past Medical History:  Diagnosis Date  . Anemia   . Arthritis   . Diabetes mellitus, type II ([place])   . Dyspnea    on exertion  . Headache   . Pneumonia    h/o  . PONV (postoperative nausea and vomiting)   . Sleep apnea   . Vertigo     Past Surgical History:  Procedure Laterality Date  . ABDOMINAL HYSTERECTOMY    . APPENDECTOMY    . BLEPHAROPLASTY Bilateral 01/2018  . CARPAL TUNNEL RELEASE Left 2008  . HEMORROIDECTOMY    . UMBILICAL HERNIA REPAIR      There were no vitals filed for this visit.  Subjective Assessment - 05/24/19 1318    Subjective  I had a rough time after Thanksgiving.  It was enough to where I had to wear a pad, a drop or two.    Pertinent History  hemorrhoid surger 2017    Currently in Pain?  No/denies                       [facility] Adult PT Treatment/Exercise - 05/24/19 0001      Lumbar Exercises: Standing   Other Standing Lumbar Exercises  forward lean on table - kegel 10x 5 sec hold and 3 x 20 sec hold      Lumbar Exercises: Seated   Long Arc Quad on Chair  Strengthening;Both;20 reps    Sit to Stand  10 reps   exhale with standing   Other Seated Lumbar Exercises  green band clam             PT Education - 05/24/19 1612    Education Details  [email or web address]    Person(s) Educated  Patient     Methods  Explanation;Demonstration;Handout;Verbal cues    Comprehension  Verbalized understanding;Returned demonstration       PT Short Term Goals - 05/15/19 1104      PT SHORT TERM GOAL #1   Title  ind with initial HEP    Status  Achieved      PT SHORT TERM GOAL #2   Title  pt will report improved bowel consistency that is more firm    Baseline  more firm    Status  Achieved        PT Long Term Goals - 04/26/19 1424      PT LONG TERM GOAL #1   Title  pt will report she has not had any incontinence that gets beyond the pad    Time  12    Period  Weeks    Status  New    Target Date  07/19/19      PT LONG TERM GOAL #2   Title  Pt will  report 50% less frequency of incontinence    Time  12    Period  Weeks    Status  New    Target Date  07/19/19      PT LONG TERM GOAL #3   Title  Pt will demonstrate 3/5 MMT of anal sphincters and be able to hold for at least 15 seconds for improved bowel control    Time  12    Period  Weeks    Status  New    Target Date  07/19/19      PT LONG TERM GOAL #4   Title  Pt will be able to perform transfers correctly without valsava maneuver or holding her breath.    Time  12    Period  Weeks    Status  New    Target Date  07/19/19            Plan - 05/24/19 1326    Clinical Impression Statement  Pt had worse week last week and states it was since Thanksgiving.  She states there were no accidents that went through the pad though and it is not as bad as it used to be.  Pt reports she can feel the muscles of her pelvic floor. She was educated on slowing down the exercises as well as exercise porgressions in standing today.  Pt will benefit from skilled PT to continue working on strength and endurance for improved bowel control.    PT Treatment/Interventions  ADLs/Self Care Home Management;Biofeedback;Cryotherapy;Electrical Stimulation;Moist Heat;Therapeutic activities;Therapeutic exercise;Neuromuscular re-education;Patient/family  education;Manual techniques;Taping;Dry needling;Passive range of motion    PT Next Visit Plan  biofeedback anal sphincters, f/u on clam and exhale with squeeze HEP, posture, core strength, progress    PT Home Exercise Plan  Access Code: [email or web address]    Consulted and Agree with Plan of Care  Patient       Patient will benefit from skilled therapeutic intervention in order to improve the following deficits and impairments:  Improper body mechanics, Postural dysfunction, Impaired flexibility, Decreased coordination, Decreased range of motion, Decreased strength  Visit Diagnosis: Muscle weakness (generalized)  Unspecified lack of coordination  Abnormal posture     Problem List Patient Active Problem List   Diagnosis Date Noted  . Lumbar stenosis with neurogenic claudication 07/11/2018  . Uncontrolled type 2 diabetes mellitus with stage 3 chronic kidney disease, with long-term current use of insulin (HCC) 01/24/2016  . Mixed hyperlipidemia 01/24/2016  . Essential hypertension, benign 01/24/2016  . Morbid obesity due to excess calories (HCC) 01/24/2016    Junious Silk, PT 05/24/2019, 4:36 PM  [facility] [address] [place], [place], [address] Phone: [phone]   Fax:  [phone]  Name: PANG ROBERS MRN: [id number] Date of Birth: 07-21-1945

## 2019-05-24 NOTE — Patient Instructions (Signed)
[id number] updates

## 2019-05-26 ENCOUNTER — Telehealth: Payer: Self-pay | Admitting: Internal Medicine

## 2019-05-26 NOTE — Telephone Encounter (Signed)
Patient advised that she will fast overnight and start the breath test in the morning.  She will call back for any questions

## 2019-05-31 ENCOUNTER — Other Ambulatory Visit: Payer: Self-pay

## 2019-05-31 ENCOUNTER — Encounter: Payer: Self-pay | Admitting: Physical Therapy

## 2019-05-31 ENCOUNTER — Ambulatory Visit: Payer: Medicare Other | Admitting: Physical Therapy

## 2019-05-31 DIAGNOSIS — R293 Abnormal posture: Secondary | ICD-10-CM

## 2019-05-31 DIAGNOSIS — R279 Unspecified lack of coordination: Secondary | ICD-10-CM

## 2019-05-31 DIAGNOSIS — M6281 Muscle weakness (generalized): Secondary | ICD-10-CM | POA: Diagnosis not present

## 2019-05-31 NOTE — Therapy (Signed)
[facility] [address] [place], [place], [address] Phone: [phone]   Fax:  [phone]  Physical Therapy Treatment  Patient Details  Name: Tiffany Brock MRN: [id number] Date of Birth: 1945/11/27 Referring Provider (PT): Iva Boop, MD   Encounter Date: 05/31/2019  PT End of Session - 05/31/19 1322    Visit Number  5    Number of Visits  10    Date for PT Re-Evaluation  07/19/19    PT Start Time  1318    PT Stop Time  1358    PT Time Calculation (min)  40 min    Activity Tolerance  Patient tolerated treatment well    Behavior During Therapy  [facility] for tasks assessed/performed       Past Medical History:  Diagnosis Date  . Anemia   . Arthritis   . Diabetes mellitus, type II (HCC)   . Dyspnea    on exertion  . Headache   . Pneumonia    h/o  . PONV (postoperative nausea and vomiting)   . Sleep apnea   . Vertigo     Past Surgical History:  Procedure Laterality Date  . ABDOMINAL HYSTERECTOMY    . APPENDECTOMY    . BLEPHAROPLASTY Bilateral 01/2018  . CARPAL TUNNEL RELEASE Left 2008  . HEMORROIDECTOMY    . UMBILICAL HERNIA REPAIR      There were no vitals filed for this visit.  Subjective Assessment - 05/31/19 1451    Subjective  I have been doing better.  No leakage this week and I feel like I am stronger.    Currently in Pain?  No/denies    Multiple Pain Sites  No                    Pelvic Floor Special Questions - 05/31/19 0001    Biofeedback  sit to stand with kegel and exhale; seated kegel, seated ball squeeze with kegel; seated red band abduction with kegel; rest at 82mV and strength ranging from 4-89mV with larger spike initially then droppingdown to around 44mV    Biofeedback sensor type  Surface    Biofeedback Activity  --   hold 6 sec; rest 12 sec       [facility] Adult PT Treatment/Exercise - 05/31/19 0001      Lumbar Exercises: Aerobic   Nustep  L1 x 6 min - cues  to engage core and pelvic floor               PT Short Term Goals - 05/15/19 1104      PT SHORT TERM GOAL #1   Title  ind with initial HEP    Status  Achieved      PT SHORT TERM GOAL #2   Title  pt will report improved bowel consistency that is more firm    Baseline  more firm    Status  Achieved        PT Long Term Goals - 05/31/19 1457      PT LONG TERM GOAL #1   Title  pt will report she has not had any incontinence that gets beyond the pad    Baseline  did well this week    Status  On-going      PT LONG TERM GOAL #2   Title  Pt will report 50% less frequency of incontinence    Baseline  improving    Status  On-going  PT LONG TERM GOAL #3   Title  Pt will demonstrate 3/5 MMT of anal sphincters and be able to hold for at least 15 seconds for improved bowel control    Baseline  low strength based on biofeedback activity and holding for 6 seconds    Status  On-going      PT LONG TERM GOAL #4   Title  Pt will be able to perform transfers correctly without valsava maneuver or holding her breath.    Baseline  is able to do sometimes but not consistently    Status  On-going            Plan - 05/31/19 1455    Clinical Impression Statement  Pt had no leakage this week and states she is feeling better.  Pt did well using biofeedback and it was helpful to use program for helping her pace and take appropriate rest breaks.  Pt will benefit from another use of biofeedback to re-assess ability to engage pelvic floor and to know how long she is able to sustain the contraction.       Patient will benefit from skilled therapeutic intervention in order to improve the following deficits and impairments:     Visit Diagnosis: Muscle weakness (generalized)  Unspecified lack of coordination  Abnormal posture     Problem List Patient Active Problem List   Diagnosis Date Noted  . Lumbar stenosis with neurogenic claudication 07/11/2018  . Uncontrolled type 2  diabetes mellitus with stage 3 chronic kidney disease, with long-term current use of insulin ([place]) 01/24/2016  . Mixed hyperlipidemia 01/24/2016  . Essential hypertension, benign 01/24/2016  . Morbid obesity due to excess calories ([place]) 01/24/2016    Jule Ser, PT 05/31/2019, 3:00 PM  [place] [facility] [address], [place], [place], [address] Phone: [phone]   Fax:  [phone]  Name: Tiffany Brock MRN: [id number] Date of Birth: 1945/12/23

## 2019-06-07 ENCOUNTER — Encounter: Payer: Medicare Other | Admitting: Physical Therapy

## 2019-06-09 ENCOUNTER — Other Ambulatory Visit: Payer: Self-pay | Admitting: "Endocrinology

## 2019-06-14 ENCOUNTER — Encounter: Payer: Medicare Other | Admitting: Physical Therapy

## 2019-06-20 ENCOUNTER — Other Ambulatory Visit: Payer: Self-pay

## 2019-06-20 ENCOUNTER — Ambulatory Visit (HOSPITAL_COMMUNITY)
Admission: RE | Admit: 2019-06-20 | Discharge: 2019-06-20 | Disposition: A | Discharge status: Home / Self Care | Payer: Medicare Other | Source: Ambulatory Visit | Attending: Vascular Surgery | Admitting: Vascular Surgery

## 2019-06-20 ENCOUNTER — Ambulatory Visit (INDEPENDENT_AMBULATORY_CARE_PROVIDER_SITE_OTHER): Payer: Medicare Other | Admitting: Physician Assistant

## 2019-06-20 ENCOUNTER — Other Ambulatory Visit (HOSPITAL_COMMUNITY): Payer: Self-pay | Admitting: Vascular Surgery

## 2019-06-20 VITALS — BP 132/65 | HR 65 | Temp 97.2°F | Resp 20 | Ht 65.0 in | Wt 254.0 lb

## 2019-06-20 DIAGNOSIS — I872 Venous insufficiency (chronic) (peripheral): Secondary | ICD-10-CM

## 2019-06-20 DIAGNOSIS — M7989 Other specified soft tissue disorders: Secondary | ICD-10-CM

## 2019-06-20 NOTE — Progress Notes (Signed)
VASCULAR & VEIN SPECIALISTS           OF [place]  History and Physical   Tiffany EuropeJudith G Nolden is a 73 y.o. (1946/03/17) female who presents with hx of chronic leg swelling with the left greater than right.  She states she had back surgery in January and has had swelling in the left leg since then.   She doesn't walk very far due to her back issues.  She has had another previous back surgery for spinal stenosis.  She states she also has numbness in her left leg and foot and sometimes it burns and tingles.  She states that she has tried some compression socks but unsure of the grade of compression.  She states they sometimes work.  She states that the swelling is better in the mornings when she wakes.  She states that taking her Gabapentin and elevation helps with the swelling.  She states that she gets cramps in her legs but it does not happen every time and is random.  She is worried about losing her legs.   She states that her grandmother lost both her legs due to diabetes.  She states she thinks her mom died from a AAA.    The pt does not have hx of DVT.   The pt does not have hx of varicose veins or ulcers.     She does have a hx of OSA & uses CPAP.  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  She has longstanding iron deficiency anemia.    The pt is on a statin for cholesterol management.  The pt is not on a daily aspirin.   Other AC:  none The pt is on ARB for hypertension.   The pt is diabetic.   Tobacco hx:  never  Past Medical History:  Diagnosis Date  . Anemia   . Arthritis   . Diabetes mellitus, type II (HCC)   . Dyspnea    on exertion  . Headache   . Pneumonia    h/o  . PONV (postoperative nausea and vomiting)   . Sleep apnea   . Vertigo     Past Surgical History:  Procedure Laterality Date  . ABDOMINAL HYSTERECTOMY    . APPENDECTOMY    . BLEPHAROPLASTY Bilateral 01/2018  . CARPAL TUNNEL RELEASE Left 2008  .  HEMORROIDECTOMY    . UMBILICAL HERNIA REPAIR      Social History   Socioeconomic History  . Marital status: Married    Spouse name: Not on file  . Number of children: Not on file  . Years of education: Not on file  . Highest education level: Not on file  Occupational History  . Not on file  Tobacco Use  . Smoking status: Never Smoker  . Smokeless tobacco: Never Used  Substance and Sexual Activity  . Alcohol use: No  . Drug use: No  . Sexual activity: Not on file  Other Topics Concern  . Not on file  Social History Narrative  . Not on file   Social Determinants of Health   Financial Resource Strain:   . Difficulty of Paying Living Expenses: Not on file  Food Insecurity:   . Worried About Programme researcher, broadcasting/film/videounning Out of Food in the Last Year: Not on file  . Ran Out of Food in the Last Year: Not on file  Transportation Needs:   . Lack of  Transportation (Medical): Not on file  . Lack of Transportation (Non-Medical): Not on file  Physical Activity:   . Days of Exercise per Week: Not on file  . Minutes of Exercise per Session: Not on file  Stress:   . Feeling of Stress : Not on file  Social Connections:   . Frequency of Communication with Friends and Family: Not on file  . Frequency of Social Gatherings with Friends and Family: Not on file  . Attends Religious Services: Not on file  . Active Member of Clubs or Organizations: Not on file  . Attends Banker Meetings: Not on file  . Marital Status: Not on file  Intimate Partner Violence:   . Fear of Current or Ex-Partner: Not on file  . Emotionally Abused: Not on file  . Physically Abused: Not on file  . Sexually Abused: Not on file     Family History  Problem Relation Age of Onset  . Heart disease Brother     Current Outpatient Medications  Medication Sig Dispense Refill  . albuterol (PROVENTIL HFA;VENTOLIN HFA) 108 (90 Base) MCG/ACT inhaler Inhale 2 puffs into the lungs every 6 (six) hours as needed for wheezing or  shortness of breath.    . B-D ULTRAFINE III SHORT PEN 31G X 8 MM MISC USE AS DIRECTED 100 each 5  . Continuous Blood Gluc Sensor (FREESTYLE LIBRE SENSOR SYSTEM) MISC Use one sensor every 10 days. 3 each 2  . esomeprazole (NEXIUM) 40 MG capsule Take 40 mg by mouth daily.  1  . Ferrous Fumarate (HEMOCYTE - 106 MG FE) 324 (106 Fe) MG TABS tablet Take 1 tablet by mouth.    . furosemide (LASIX) 20 MG tablet Take 20 mg by mouth.    . gabapentin (NEURONTIN) 300 MG capsule Take 300 mg by mouth at bedtime. 3 tablets at bedtime    . insulin aspart (NOVOLOG) 100 UNIT/ML FlexPen INJECT 25-31 UNITS INTO THE SKIN 3 (THREE) TIMES DAILY WITH MEALS. 90 mL 0  . Insulin Glargine (BASAGLAR KWIKPEN) 100 UNIT/ML SOPN INJECT 90 UNITS UNDER THE SKIN DAILY AT 10 PM 90 pen 0  . JANUVIA 50 MG tablet TAKE 1 TABLET BY MOUTH EVERY DAY 90 tablet 0  . losartan-hydrochlorothiazide (HYZAAR) 50-12.5 MG tablet Take 1 tablet by mouth daily.    . NON FORMULARY CPAP    . PARoxetine (PAXIL) 10 MG tablet Take 10 mg by mouth daily.  0  . simvastatin (ZOCOR) 40 MG tablet Take 40 mg by mouth daily.    . Vitamin D, Ergocalciferol, (DRISDOL) 50000 units CAPS capsule Take 50,000 Units by mouth every Sunday.   2   No current facility-administered medications for this visit.    Allergies  Allergen Reactions  . Biaxin [Clarithromycin] Hives    REVIEW OF SYSTEMS:   [X]  denotes positive finding, [ ]  denotes negative finding Cardiac  Comments:  Chest pain or chest pressure:    Shortness of breath upon exertion:    Short of breath when lying flat:    Irregular heart rhythm:        Vascular    Pain in calf, thigh, or hip brought on by ambulation: x   Pain in feet at night that wakes you up from your sleep:  x   Blood clot in your veins:    Leg swelling:  x       Pulmonary    Oxygen at home:    Productive cough:     Wheezing:  Neurologic    weakness in legs:  x   numbness in legs:  x   Sudden onset of difficulty  speaking or slurred speech:    Temporary loss of vision in one eye:     Problems with dizziness:         Gastrointestinal    Blood in stool:     Vomited blood:         Genitourinary    Burning when urinating:     Blood in urine:        Psychiatric     depression:  x       Hematologic    Bleeding problems:    Problems with blood clotting too easily:        Skin    Rashes or ulcers:        Constitutional    Fever or chills:      PHYSICAL EXAMINATION:  Today's Vitals   06/20/19 1429  BP: 132/65  Pulse: 65  Resp: 20  Temp: (!) 97.2 F (36.2 C)  SpO2: 98%  Weight: 254 lb (115.2 kg)  Height: 5\' 5"  (1.651 m)   Body mass index is 42.27 kg/m.   General:  WDWN in NAD; vital signs documented above Gait: Not observed HENT: WNL, normocephalic Pulmonary: normal non-labored breathing , without Rales, rhonchi,  wheezing Cardiac: regular HR; without carotid bruits Abdomen: soft, NT, no masses Skin: without rashes Vascular Exam/Pulses:  Right Left  Radial 2+ (normal) 2+ (normal)  Ulnar 2+ (normal) 2+ (normal)  Popliteal Unable to palpate  Unable to palpate   DP 2+ (normal) 2+ (normal)  PT 2+ (normal) 2+ (normal)   Extremities: without ischemic changes, without Gangrene , without cellulitis; without open wounds; +BLE swelling with left>right. There are small superficial veins present above the left ankle.  No ulcers on either foot.  Sensation is not in tact left foot.   Musculoskeletal: no muscle wasting or atrophy  Neurologic: A&O X 3;  No focal weakness or paresthesias are detected Psychiatric:  The pt has Normal affect.   Non-Invasive Vascular Imaging:   Venous duplex on 06/20/2019: +--------------+------+-----------+------------+-------------------------------+ LEFT          RefluxReflux TimeDiameter cmsComments                                       Yes                                                          +--------------+------+-----------+------------+-------------------------------+ CFV            yes   >1 second                                             +--------------+------+-----------+------------+-------------------------------+ GSV at SFJ     yes    >500 ms      0.98                                    +--------------+------+-----------+------------+-------------------------------+ GSV prox thigh  no  0.35                                    +--------------+------+-----------+------------+-------------------------------+ GSV mid thigh   no                 0.29                                    +--------------+------+-----------+------------+-------------------------------+ GSV dist thigh  no                 0.36                                    +--------------+------+-----------+------------+-------------------------------+ GSV at knee     no                 0.37                                    +--------------+------+-----------+------------+-------------------------------+ GSV prox calf   no                 0.32                                    +--------------+------+-----------+------------+-------------------------------+ AASV           yes    >500 ms      0.43    courses out of fascia between                                              the proximal and mid thigh      +--------------+------+-----------+------------+-------------------------------+  Summary: Left:  There is no evidence of deep vein thrombosis in the lower extremity. There is no evidence of superficial venous thrombosis.  A cystic structure is found in the popliteal fossa measuring approximately 1.93 x 2.80 cm in diameter. The great saphenous vein is competent. The small saphenous vein is competent. There is an incompetent anterior saphenous vein branch observed from the saphenofemoral junction to proximal/mid thigh.   Meli Faley  Cohenour is a 73 y.o. female who presents with: chronic leg swelling  -pt with easily palpable DP/PT pulses bilaterally. -recommend 20-24mmHg thigh high compression stockings for 3 months and return to see either Dr. Edilia Bo or Dr. Darrick Penna for evaluation for intervention as well as leg elevation.  Discussed how to elevate legs as well as weight loss.  - She is given information on Elastic Therapy. -discussed with her given she has diabetic neuropathy, that she should not be clipping her own toenails and inspect her feet regularly and protect her feet.  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  They do not have ulcers and have never bled, but did discuss with pt how to hold pressure and elevate legs should they bleed in the future.  -her mother died with hx of AAA-when she returns in 3 months, will get screening AAA at that time.     Doreatha Massed, [facility] Vein Specialists 06/20/2019  1:35 PM  Clinic MD:  Chestine Spore

## 2019-06-20 NOTE — Progress Notes (Deleted)
VASCULAR & VEIN SPECIALISTS           OF [place]  History and Physical   Tiffany Brock is a 73 y.o. ([birth year]) female who presents with hx of lower extremity varicosities referred by Shari Prows.  Patient's past medical history significant for L5-S1 lumbar decompression secondary to spinal stenosis and neurogenic claudication.    The pt *** have hx of DVT.   The pt does have hx of varicose veins or ulcers.    There *** skin changes ***.   The pt *** have family hx of venous issues.   The pt has *** tried compression socks.   ***  The pt is on a statin for cholesterol management.  The pt not on a daily aspirin.   Other AC: None The pt is on losartan/thiazide for hypertension.   The pt is diabetic.  Tobacco hx: Denies  Past Medical History:  Diagnosis Date  . Anemia   . Arthritis   . Diabetes mellitus, type II ([place])   . Dyspnea    on exertion  . Headache   . Pneumonia    h/o  . PONV (postoperative nausea and vomiting)   . Sleep apnea   . Vertigo     Past Surgical History:  Procedure Laterality Date  . ABDOMINAL HYSTERECTOMY    . APPENDECTOMY    . BLEPHAROPLASTY Bilateral 01/2018  . CARPAL TUNNEL RELEASE Left 2008  . HEMORROIDECTOMY    . UMBILICAL HERNIA REPAIR      Social History   Socioeconomic History  . Marital status: Married    Spouse name: Not on file  . Number of children: Not on file  . Years of education: Not on file  . Highest education level: Not on file  Occupational History  . Not on file  Tobacco Use  . Smoking status: Never Smoker  . Smokeless tobacco: Never Used  Substance and Sexual Activity  . Alcohol use: No  . Drug use: No  . Sexual activity: Not on file  Other Topics Concern  . Not on file  Social History Narrative  . Not on file   Social Determinants of Health   Financial Resource Strain:   . Difficulty of Paying Living Expenses: Not on file  Food Insecurity:   . Worried About Charity fundraiser in  the Last Year: Not on file  . Ran Out of Food in the Last Year: Not on file  Transportation Needs:   . Lack of Transportation (Medical): Not on file  . Lack of Transportation (Non-Medical): Not on file  Physical Activity:   . Days of Exercise per Week: Not on file  . Minutes of Exercise per Session: Not on file  Stress:   . Feeling of Stress : Not on file  Social Connections:   . Frequency of Communication with Friends and Family: Not on file  . Frequency of Social Gatherings with Friends and Family: Not on file  . Attends Religious Services: Not on file  . Active Member of Clubs or Organizations: Not on file  . Attends Archivist Meetings: Not on file  . Marital Status: Not on file  Intimate Partner Violence:   . Fear of Current or Ex-Partner: Not on file  . Emotionally Abused: Not on file  . Physically Abused: Not on file  . Sexually Abused: Not on file    *** Family History  Problem Relation Age of Onset  .  Heart disease Brother     Current Outpatient Medications  Medication Sig Dispense Refill  . albuterol (PROVENTIL HFA;VENTOLIN HFA) 108 (90 Base) MCG/ACT inhaler Inhale 2 puffs into the lungs every 6 (six) hours as needed for wheezing or shortness of breath.    . B-D ULTRAFINE III SHORT PEN 31G X 8 MM MISC USE AS DIRECTED 100 each 5  . Continuous Blood Gluc Sensor (FREESTYLE LIBRE SENSOR SYSTEM) MISC Use one sensor every 10 days. 3 each 2  . esomeprazole (NEXIUM) 40 MG capsule Take 40 mg by mouth daily.  1  . Ferrous Fumarate (HEMOCYTE - 106 MG FE) 324 (106 Fe) MG TABS tablet Take 1 tablet by mouth.    . furosemide (LASIX) 20 MG tablet Take 20 mg by mouth.    . gabapentin (NEURONTIN) 300 MG capsule Take 300 mg by mouth at bedtime. 3 tablets at bedtime    . insulin aspart (NOVOLOG) 100 UNIT/ML FlexPen INJECT 25-31 UNITS INTO THE SKIN 3 (THREE) TIMES DAILY WITH MEALS. 90 mL 0  . Insulin Glargine (BASAGLAR KWIKPEN) 100 UNIT/ML SOPN INJECT 90 UNITS UNDER THE SKIN  DAILY AT 10 PM 90 pen 0  . JANUVIA 50 MG tablet TAKE 1 TABLET BY MOUTH EVERY DAY 90 tablet 0  . losartan-hydrochlorothiazide (HYZAAR) 50-12.5 MG tablet Take 1 tablet by mouth daily.    . NON FORMULARY CPAP    . PARoxetine (PAXIL) 10 MG tablet Take 10 mg by mouth daily.  0  . simvastatin (ZOCOR) 40 MG tablet Take 40 mg by mouth daily.    . Vitamin D, Ergocalciferol, (DRISDOL) 50000 units CAPS capsule Take 50,000 Units by mouth every Sunday.   2   No current facility-administered medications for this visit.    Allergies  Allergen Reactions  . Biaxin [Clarithromycin] Hives    REVIEW OF SYSTEMS:  *** [X]  denotes positive finding, [ ]  denotes negative finding Cardiac  Comments:  Chest pain or chest pressure:    Shortness of breath upon exertion:    Short of breath when lying flat:    Irregular heart rhythm:        Vascular    Pain in calf, thigh, or hip brought on by ambulation:    Pain in feet at night that wakes you up from your sleep:     Blood clot in your veins:    Leg swelling:         Pulmonary    Oxygen at home:    Productive cough:     Wheezing:         Neurologic    Sudden weakness in arms or legs:     Sudden numbness in arms or legs:     Sudden onset of difficulty speaking or slurred speech:    Temporary loss of vision in one eye:     Problems with dizziness:         Gastrointestinal    Blood in stool:     Vomited blood:         Genitourinary    Burning when urinating:     Blood in urine:        Psychiatric    Major depression:         Hematologic    Bleeding problems:    Problems with blood clotting too easily:        Skin    Rashes or ulcers:        Constitutional    Fever or chills:  PHYSICAL EXAMINATION:  ***  General:  WDWN in NAD; vital signs documented above Gait: Not observed HENT: WNL, normocephalic Pulmonary: normal non-labored breathing , without Rales, rhonchi,  wheezing Cardiac: {Desc; regular/irreg:14544} HR;  {With/Without:20273} carotid bruit*** Abdomen: soft, NT, no masses Skin: {With/Without:20273} rashes Vascular Exam/Pulses:  Right Left  Radial {Exam; arterial pulse strength 0-4:30167} {Exam; arterial pulse strength 0-4:30167}  Ulnar {Exam; arterial pulse strength 0-4:30167} {Exam; arterial pulse strength 0-4:30167}  Femoral {Exam; arterial pulse strength 0-4:30167} {Exam; arterial pulse strength 0-4:30167}  Popliteal {Exam; arterial pulse strength 0-4:30167} {Exam; arterial pulse strength 0-4:30167}  DP {Exam; arterial pulse strength 0-4:30167} {Exam; arterial pulse strength 0-4:30167}  PT {Exam; arterial pulse strength 0-4:30167} {Exam; arterial pulse strength 0-4:30167}   Extremities: {With/Without:20273} ischemic changes, {With/Without:20273} Gangrene , {With/Without:20273} cellulitis; {With/Without:20273} open wounds;  Musculoskeletal: no muscle wasting or atrophy  Neurologic: A&O X 3;  No focal weakness or paresthesias are detected Psychiatric:  The pt has {Desc; normal/abnormal:11317::"Normal"} affect.   Non-Invasive Vascular Imaging:   Venous duplex on ***: ***   Tiffany Brock is a 73 y.o. female who presents with: ***  ***  Tiffany MassedSamantha Brock, [facility] 06/20/2019 9:11 AM  Clinic MD:  ***

## 2019-06-21 ENCOUNTER — Ambulatory Visit: Payer: Medicare Other | Admitting: Physical Therapy

## 2019-06-21 DIAGNOSIS — M6281 Muscle weakness (generalized): Secondary | ICD-10-CM | POA: Diagnosis not present

## 2019-06-21 DIAGNOSIS — R293 Abnormal posture: Secondary | ICD-10-CM

## 2019-06-21 DIAGNOSIS — R279 Unspecified lack of coordination: Secondary | ICD-10-CM

## 2019-06-21 NOTE — Therapy (Signed)
[facility] [address], [place], [place], [address] Phone: [phone]   Fax:  [phone]  Physical Therapy Treatment  Patient Details  Name: Tiffany Brock MRN: [id number] Date of Birth: 05/18/1946 Referring Provider (PT): Gatha Mayer, MD   Encounter Date: 06/21/2019  PT End of Session - 06/21/19 1320    Visit Number  6    Number of Visits  10    Date for PT Re-Evaluation  07/19/19    PT Start Time  6269    PT Stop Time  1359    PT Time Calculation (min)  42 min    Activity Tolerance  Patient tolerated treatment well    Behavior During Therapy  [facility] for tasks assessed/performed       Past Medical History:  Diagnosis Date  . Anemia   . Arthritis   . Diabetes mellitus, type II ([place])   . Dyspnea    on exertion  . Headache   . Hyperlipidemia   . Hypertension   . Pneumonia    h/o  . PONV (postoperative nausea and vomiting)   . Sleep apnea   . Vertigo     Past Surgical History:  Procedure Laterality Date  . ABDOMINAL HYSTERECTOMY    . APPENDECTOMY    . BLEPHAROPLASTY Bilateral 01/2018  . CARPAL TUNNEL RELEASE Left 2008  . HEMORROIDECTOMY    . UMBILICAL HERNIA REPAIR      There were no vitals filed for this visit.  Subjective Assessment - 06/21/19 1326    Subjective  I only had one small accident since last time, but  not bad.  I waited too long instead of just going.  I waited a few minutes and then drove a couple miles home and just couldn't make it.    Currently in Pain?  No/denies                       [facility] Adult PT Treatment/Exercise - 06/21/19 0001      Lumbar Exercises: Stretches   Active Hamstring Stretch  Right;Left;5 reps;10 seconds   seated   Gastroc Stretch  1 rep;60 seconds    Other Lumbar Stretch Exercise  flexion with ball roll - 10x 5      Lumbar Exercises: Standing   Other Standing Lumbar Exercises  forward step holding  yellow band kegel    Other  Standing Lumbar Exercises  flexion standing on foam - holding yellow weighted ball - 10x with kegel      Lumbar Exercises: Seated   Long Arc Quad on Chair  Strengthening;Both;20 reps    LAQ on Chair Weights (lbs)  2    LAQ on Chair Limitations  ball squeeze    Sit to Stand  10 reps   exhale with standing   Other Seated Lumbar Exercises  green band clam    Other Seated Lumbar Exercises  rotation holding weighted ball - diagonal               PT Short Term Goals - 05/15/19 1104      PT SHORT TERM GOAL #1   Title  ind with initial HEP    Status  Achieved      PT SHORT TERM GOAL #2   Title  pt will report improved bowel consistency that is more firm    Baseline  more firm    Status  Achieved        PT  Long Term Goals - 06/21/19 1321      PT LONG TERM GOAL #1   Title  pt will report she has not had any incontinence that gets beyond the pad    Baseline  only needs the pad when going out somewhere and only needed the pad one time    Status  Achieved      PT LONG TERM GOAL #2   Title  Pt will report 50% less frequency of incontinence    Baseline  80-85% less incontinence    Status  Achieved      PT LONG TERM GOAL #3   Title  Pt will demonstrate 3/5 MMT of anal sphincters and be able to hold for at least 15 seconds for improved bowel control    Baseline  can do kegel exercise and holding for 25 sec    Status  Achieved      PT LONG TERM GOAL #4   Title  Pt will be able to perform transfers correctly without valsava maneuver or holding her breath.    Baseline  can do this correctly, but will continue to    Status  Achieved            Plan - 06/21/19 1344    Clinical Impression Statement  Pt has met all goals.  She is independent with her HEP and demonstrates good technique with exercises today.  pt will discharge with HEP today.    PT Treatment/Interventions  ADLs/Self Care Home Management;Biofeedback;Cryotherapy;Electrical Stimulation;Moist Heat;Therapeutic  activities;Therapeutic exercise;Neuromuscular re-education;Patient/family education;Manual techniques;Taping;Dry needling;Passive range of motion    PT Next Visit Plan  biofeedback anal sphincters, f/u on clam and exhale with squeeze HEP, posture, core strength, progress    PT Home Exercise Plan  Access Code: [email or web address]    Consulted and Agree with Plan of Care  Patient       Patient will benefit from skilled therapeutic intervention in order to improve the following deficits and impairments:  Improper body mechanics, Postural dysfunction, Impaired flexibility, Decreased coordination, Decreased range of motion, Decreased strength  Visit Diagnosis: Muscle weakness (generalized)  Unspecified lack of coordination  Abnormal posture     Problem List Patient Active Problem List   Diagnosis Date Noted  . Lumbar stenosis with neurogenic claudication 07/11/2018  . Uncontrolled type 2 diabetes mellitus with stage 3 chronic kidney disease, with long-term current use of insulin ([place]) 01/24/2016  . Mixed hyperlipidemia 01/24/2016  . Essential hypertension, benign 01/24/2016  . Morbid obesity due to excess calories ([place]) 01/24/2016    Jule Ser, PT 06/21/2019, 2:12 PM  [facility] [address], [place], [place], [address] Phone: [phone]   Fax:  [phone]  Name: Tiffany Brock MRN: [id number] Date of Birth: 03-19-1946  PHYSICAL THERAPY DISCHARGE SUMMARY  Visits from Start of Care: 6  Current functional level related to goals / functional outcomes: See above goals met   Remaining deficits: See above   Education / Equipment: HEP  Plan: Patient agrees to discharge.  Patient goals were met. Patient is being discharged due to meeting the stated rehab goals.  ?????    [employer], PT 06/21/19 2:13 PM

## 2019-06-27 ENCOUNTER — Encounter: Payer: Self-pay | Admitting: "Endocrinology

## 2019-06-27 ENCOUNTER — Ambulatory Visit: Payer: Medicare Other | Admitting: "Endocrinology

## 2019-06-27 ENCOUNTER — Other Ambulatory Visit: Payer: Self-pay

## 2019-06-27 VITALS — BP 128/75 | HR 67 | Ht 65.0 in | Wt 254.6 lb

## 2019-06-27 DIAGNOSIS — E1165 Type 2 diabetes mellitus with hyperglycemia: Secondary | ICD-10-CM | POA: Diagnosis not present

## 2019-06-27 DIAGNOSIS — E1122 Type 2 diabetes mellitus with diabetic chronic kidney disease: Secondary | ICD-10-CM

## 2019-06-27 DIAGNOSIS — E782 Mixed hyperlipidemia: Secondary | ICD-10-CM | POA: Diagnosis not present

## 2019-06-27 DIAGNOSIS — N183 Chronic kidney disease, stage 3 unspecified: Secondary | ICD-10-CM | POA: Diagnosis not present

## 2019-06-27 DIAGNOSIS — IMO0002 Reserved for concepts with insufficient information to code with codable children: Secondary | ICD-10-CM

## 2019-06-27 DIAGNOSIS — Z794 Long term (current) use of insulin: Secondary | ICD-10-CM

## 2019-06-27 DIAGNOSIS — I10 Essential (primary) hypertension: Secondary | ICD-10-CM | POA: Diagnosis not present

## 2019-06-27 LAB — POCT GLYCOSYLATED HEMOGLOBIN (HGB A1C): Hemoglobin A1C: 7.9 % — AB (ref 4.0–5.6)

## 2019-06-27 MED ORDER — SITAGLIPTIN PHOSPHATE 50 MG PO TABS: 50.0000 mg | ORAL_TABLET | Freq: Every day | ORAL | 1 refills | Status: DC

## 2019-06-27 NOTE — Patient Instructions (Signed)

## 2019-06-27 NOTE — Progress Notes (Signed)
06/27/2019   Subjective:    Patient ID: Tiffany Brock, female    DOB: 08/08/1945.  Tiffany Brock is here for follow-up  for management of type 2 diabetes, hyperlipidemia, hypertension.  PCP: Garald Braver  Past Medical History:  Diagnosis Date  . Anemia   . Arthritis   . Diabetes mellitus, type II (HCC)   . Dyspnea    on exertion  . Headache   . Hyperlipidemia   . Hypertension   . Pneumonia    h/o  . PONV (postoperative nausea and vomiting)   . Sleep apnea   . Vertigo    Past Surgical History:  Procedure Laterality Date  . ABDOMINAL HYSTERECTOMY    . APPENDECTOMY    . BLEPHAROPLASTY Bilateral 01/2018  . CARPAL TUNNEL RELEASE Left 2008  . HEMORROIDECTOMY    . UMBILICAL HERNIA REPAIR     Social History   Socioeconomic History  . Marital status: Married    Spouse name: Not on file  . Number of children: Not on file  . Years of education: Not on file  . Highest education level: Not on file  Occupational History  . Not on file  Tobacco Use  . Smoking status: Never Smoker  . Smokeless tobacco: Never Used  Substance and Sexual Activity  . Alcohol use: No  . Drug use: No  . Sexual activity: Not on file  Other Topics Concern  . Not on file  Social History Narrative  . Not on file   Social Determinants of Health   Financial Resource Strain:   . Difficulty of Paying Living Expenses: Not on file  Food Insecurity:   . Worried About Programme researcher, broadcasting/film/video in the Last Year: Not on file  . Ran Out of Food in the Last Year: Not on file  Transportation Needs:   . Lack of Transportation (Medical): Not on file  . Lack of Transportation (Non-Medical): Not on file  Physical Activity:   . Days of Exercise per Week: Not on file  . Minutes of Exercise per Session: Not on file  Stress:   . Feeling of Stress : Not on file  Social Connections:   . Frequency of Communication with Friends and Family: Not on file  . Frequency of Social Gatherings with Friends and Family: Not on  file  . Attends Religious Services: Not on file  . Active Member of Clubs or Organizations: Not on file  . Attends Banker Meetings: Not on file  . Marital Status: Not on file   Outpatient Encounter Medications as of 06/27/2019  Medication Sig  . albuterol (PROVENTIL HFA;VENTOLIN HFA) 108 (90 Base) MCG/ACT inhaler Inhale 2 puffs into the lungs every 6 (six) hours as needed for wheezing or shortness of breath.  . B-D ULTRAFINE III SHORT PEN 31G X 8 MM MISC USE AS DIRECTED  . Continuous Blood Gluc Sensor (FREESTYLE LIBRE SENSOR SYSTEM) MISC Use one sensor every 10 days.  Marland Kitchen esomeprazole (NEXIUM) 40 MG capsule Take 40 mg by mouth daily.  . Ferrous Fumarate (HEMOCYTE - 106 MG FE) 324 (106 Fe) MG TABS tablet Take 1 tablet by mouth.  . fluticasone (FLONASE) 50 MCG/ACT nasal spray   . furosemide (LASIX) 20 MG tablet Take 20 mg by mouth.  . gabapentin (NEURONTIN) 300 MG capsule Take 300 mg by mouth at bedtime. 3 tablets at bedtime  . insulin aspart (NOVOLOG) 100 UNIT/ML FlexPen INJECT 25-31 UNITS INTO THE SKIN 3 (THREE) TIMES DAILY WITH MEALS.  Marland Kitchen  Insulin Glargine (BASAGLAR KWIKPEN) 100 UNIT/ML SOPN INJECT 90 UNITS UNDER THE SKIN DAILY AT 10 PM  . losartan-hydrochlorothiazide (HYZAAR) 50-12.5 MG tablet Take 1 tablet by mouth daily.  . NON FORMULARY CPAP  . PARoxetine (PAXIL) 10 MG tablet Take 10 mg by mouth daily.  . simvastatin (ZOCOR) 40 MG tablet Take 40 mg by mouth daily.  . sitaGLIPtin (JANUVIA) 50 MG tablet Take 1 tablet (50 mg total) by mouth daily.  . Vitamin D, Ergocalciferol, (DRISDOL) 50000 units CAPS capsule Take 50,000 Units by mouth every Sunday.   . [DISCONTINUED] JANUVIA 50 MG tablet TAKE 1 TABLET BY MOUTH EVERY DAY   No facility-administered encounter medications on file as of 06/27/2019.   ALLERGIES: Allergies  Allergen Reactions  . Biaxin [Clarithromycin] Hives   VACCINATION STATUS:  There is no immunization history on file for this patient.  Diabetes Tiffany Brock  presents for her follow-up diabetic visit. Tiffany Brock has type 2 diabetes mellitus. Onset time: Tiffany Brock was diagnosed at approximate age of 46 years. Her disease course has been worsening. There are no hypoglycemic associated symptoms. Pertinent negatives for hypoglycemia include no confusion, headaches, pallor or seizures. Associated symptoms include fatigue. Pertinent negatives for diabetes include no blurred vision, no chest pain, no polydipsia, no polyphagia and no polyuria. There are no hypoglycemic complications. Symptoms are improving. Risk factors for coronary artery disease include diabetes mellitus, dyslipidemia, family history, hypertension, obesity and sedentary lifestyle. Current diabetic treatment includes insulin injections and oral agent (dual therapy). Tiffany Brock is following a generally unhealthy diet. When asked about meal planning, Tiffany Brock reported none. Tiffany Brock has had a previous visit with a dietitian. Tiffany Brock never participates in exercise. Her home blood glucose trend is fluctuating minimally. Her breakfast blood glucose range is generally 140-180 mg/dl. Her lunch blood glucose range is generally 180-200 mg/dl. Her dinner blood glucose range is generally 180-200 mg/dl. Her bedtime blood glucose range is generally 180-200 mg/dl. Her overall blood glucose range is 180-200 mg/dl. (Interpretation of her CGM printout show that her average blood glucose is 187 for the last 15 days, timing ranges 46%.  50% above range, no hypoglycemia.  Her point-of-care A1c is 7.9%.) An ACE inhibitor/angiotensin II receptor blocker is being taken. Tiffany Brock sees a podiatrist.Eye exam is current.  Hyperlipidemia This is a chronic problem. The current episode started more than 1 year ago. The problem is controlled. Exacerbating diseases include diabetes and obesity. Pertinent negatives include no chest pain, myalgias or shortness of breath. Current antihyperlipidemic treatment includes statins. Risk factors for coronary artery disease include  diabetes mellitus, dyslipidemia, family history, obesity, hypertension and a sedentary lifestyle.  Hypertension This is a chronic problem. The current episode started more than 1 year ago. Pertinent negatives include no blurred vision, chest pain, headaches, palpitations or shortness of breath. Risk factors for coronary artery disease include dyslipidemia, diabetes mellitus, family history, obesity and sedentary lifestyle. Past treatments include angiotensin blockers.   Review of systems:  Constitutional: + minimally fluctuating body weight, no fatigue, no subjective hyperthermia, no subjective hypothermia Eyes: no blurry vision, no xerophthalmia ENT: no sore throat, no nodules palpated in throat, no dysphagia/odynophagia, no hoarseness Cardiovascular: no Chest Pain, no Shortness of Breath, no palpitations, no leg swelling Respiratory: no cough, no SOB Gastrointestinal: no Nausea/Vomiting/Diarhhea Musculoskeletal: no muscle/joint aches Skin: no rashes Neurological: no tremors, no numbness, no tingling, no dizziness Psychiatric: no depression, no anxiety    Objective:    BP 128/75   Pulse 67   Ht 5\' 5"  (1.651 m)  Wt 254 lb 9.6 oz (115.5 kg)   BMI 42.37 kg/m   Wt Readings from Last 3 Encounters:  06/27/19 254 lb 9.6 oz (115.5 kg)  06/20/19 254 lb (115.2 kg)  03/31/19 257 lb (116.6 kg)     Physical Exam- Limited  Constitutional:  Body mass index is 42.37 kg/m. , not in acute distress, normal state of mind Eyes:  EOMI, no exophthalmos Neck: Supple Respiratory: Adequate breathing efforts Musculoskeletal: no gross deformities, strength intact in all four extremities, no gross restriction of joint movements Skin:  no rashes, no hyperemia Neurological: no tremor with outstretched hands.  CMP     Component Value Date/Time   NA 140 10/12/2018 0000   K 3.9 10/12/2018 0000   CL 102 07/04/2018 1407   CO2 24 07/04/2018 1407   GLUCOSE 293 (H) 07/04/2018 1407   BUN 18 02/16/2019  0000   CREATININE 1.4 (A) 02/16/2019 0000   CREATININE 1.30 (H) 07/04/2018 1407   CALCIUM 8.9 07/04/2018 1407   GFRNONAA 41 (L) 07/04/2018 1407   GFRAA 48 (L) 07/04/2018 1407   Diabetic Labs (most recent): Lab Results  Component Value Date   HGBA1C 7.9 (A) 06/27/2019   HGBA1C 7.4 02/16/2019   HGBA1C 8.1 10/12/2018    Lipid Panel     Component Value Date/Time   CHOL 146 03/31/2018 0000   TRIG 87 03/31/2018 0000   HDL 54 03/31/2018 0000   LDLCALC 75 03/31/2018 0000     Assessment & Plan:   1. Uncontrolled type 2 diabetes mellitus with stage 3 chronic kidney disease, with long-term current use of insulin (HCC)  - Patient has currently uncontrolled symptomatic type 2 DM since  74 years of age. - Tiffany Brock presents with blood glucose profile slightly above target, using her CGM device.  Interpretation of her CGM device was performed with her.  Her point-of-care A1c is 7.9%, increasing from 7.4%.       Her diabetes is complicated by  CKD, obesity , and sedentary life and patient remains at a high risk for more acute and chronic complications of diabetes which include CAD, CVA, CKD, retinopathy, and neuropathy. These are all discussed in detail with the patient.  - I have counseled the patient on diet management and weight loss, by adopting a carbohydrate restricted/protein rich diet.  - Tiffany Brock  admits there is a room for improvement in her diet and drink choices. -  Suggestion is made for her to avoid simple carbohydrates  from her diet including Cakes, Sweet Desserts / Pastries, Ice Cream, Soda (diet and regular), Sweet Tea, Candies, Chips, Cookies, Sweet Pastries,  Store Bought Juices, Alcohol in Excess of  1-2 drinks a day, Artificial Sweeteners, Coffee Creamer, and "Sugar-free" Products. This will help patient to have stable blood glucose profile and potentially avoid unintended weight gain.  - I encouraged the patient to switch to unprocessed or minimally processed complex starch and  increased protein intake (animal or plant source), fruits, and vegetables.  - Patient is advised to stick to a routine mealtimes to eat 3 meals  a day and avoid unnecessary snacks ( to snack only to correct hypoglycemia).   - I have approached patient with the following individualized plan to manage diabetes and patient agrees:   -  Based on her blood glucose profile and glycemic burden Tiffany Brock will continue to need intensive treatment with basal/bolus insulin in order for her to maintain control of diabetes to target.    -Tiffany Brock has a tendency for binge  consumption of sweets and sweetened beverages, promises to do better.    -Tiffany Brock is advised to continue Basaglar 90 units at bedtime, continue NovoLog 25 units 3 times a day before meals for pre-meal blood glucose above 90 mg/dL associated with strict monitoring of glucose 4 times a day-before meals and at bedtime. - Tiffany Brock is benefiting from  continued glucose monitoring, is advised to wear it at all times. -Patient is encouraged to call clinic for blood glucose levels less than 70 or above 300 mg /dl.  -Tiffany Brock is advised to continue Januvia 50 mg p.o. daily with breakfast.  -Her renal function is not improving, advised to stay away from metformin treatment for now.    - Patient specific target  A1c;  LDL, HDL, Triglycerides, and  Waist Circumference were discussed in detail.  2) BP/HTN: Her blood pressure is controlled to target.   Tiffany Brock is advised to continue her current blood pressure medications including Hyzaar.  3) Lipids/HPL: Her recent lipid panel showed controlled LDL at 67.  Tiffany Brock is advised to continue simvastatin 40 mg p.o. nightly.   Side effects and precautions discussed with her.   4) Chronic Care/Health Maintenance:  -Patient is on ARB and Statin medications and encouraged to continue to follow up with Ophthalmology, Podiatrist at least yearly or according to recommendations, and advised to   stay away from smoking. I have recommended yearly  flu vaccine and pneumonia vaccination at least every 5 years; and  sleep for at least 7 hours a day.  - I advised patient to maintain close follow up with Ephriam Jenkins E for primary care needs.  - Time spent on this patient care encounter:  35 min, of which > 50% was spent in  counseling and the rest reviewing her blood glucose logs , discussing her hypoglycemia and hyperglycemia episodes, reviewing her current and  previous labs / studies  ( including abstraction from other facilities) and medications  doses and developing a  long term treatment plan and documenting her care.   Please refer to Patient Instructions for Blood Glucose Monitoring and Insulin/Medications Dosing Guide"  in media tab for additional information. Please  also refer to " Patient Self Inventory" in the Media  tab for reviewed elements of pertinent patient history.  Tiffany Brock participated in the discussions, expressed understanding, and voiced agreement with the above plans.  All questions were answered to her satisfaction. Tiffany Brock is encouraged to contact clinic should Tiffany Brock have any questions or concerns prior to her return visit.   Follow up plan: - Return in about 3 months (around 09/25/2019) for Follow up with Pre-visit Labs, Next Visit A1c in Office.  Glade Lloyd, MD Phone: [phone]  Fax: [phone]  This note was partially dictated with voice recognition software. Similar sounding words can be transcribed inadequately or may not  be corrected upon review.  06/27/2019, 8:42 PM

## 2019-06-28 ENCOUNTER — Ambulatory Visit: Payer: Medicare Other | Admitting: Internal Medicine

## 2019-06-28 ENCOUNTER — Encounter: Payer: Self-pay | Admitting: Internal Medicine

## 2019-06-28 VITALS — BP 116/60 | HR 76 | Temp 97.1°F | Ht 65.0 in | Wt 257.1 lb

## 2019-06-28 DIAGNOSIS — K582 Mixed irritable bowel syndrome: Secondary | ICD-10-CM | POA: Diagnosis not present

## 2019-06-28 DIAGNOSIS — R159 Full incontinence of feces: Secondary | ICD-10-CM | POA: Insufficient documentation

## 2019-06-28 DIAGNOSIS — D509 Iron deficiency anemia, unspecified: Secondary | ICD-10-CM

## 2019-06-28 HISTORY — DX: Iron deficiency anemia, unspecified: D50.9

## 2019-06-28 NOTE — Assessment & Plan Note (Signed)
This has responded quite nicely to pelvic floor physical therapy.  She did not do the lactulose hydrogen breath test and prefers not to so we will hold off on that.  She might need to do it in the future but since she feels well at this time we will not do so.

## 2019-06-28 NOTE — Progress Notes (Signed)
Tiffany Brock 74 y.o. 08-13-1945 [id number]  Assessment & Plan:  Irritable bowel syndrome with both constipation and diarrhea This has responded quite nicely to pelvic floor physical therapy.  She did not do the lactulose hydrogen breath test and prefers not to so we will hold off on that.  She might need to do it in the future but since she feels well at this time we will not do so.  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  See my note of March 31, 2019 for additional details.  I am available as needed to do so.  I think she probably has small bowel AVMs that might have been seen, my records from her previous work-ups were not complete though I did see that on a problem list/history.  However she has had multiple upper endoscopies colonoscopies and capsule endoscopies and malignancy was not seen in which she has had malignancy or something fixable it should have been discovered.  I did point out that sometimes repeat examinations can turn up a fixable problem such as AVMs, and that I did not think she had cancer, but she does not want to have additional endoscopic evaluation.  I told her that was reasonable.   I appreciate the opportunity to care for this patient. CC: Su Grand Desenglau, PT Dr. Laurena Slimmer  Subjective:   Chief Complaint: Follow-up of IBS and iron deficiency  HPI The patient is here for follow-up with her husband, she has a history of chronic iron deficiency with a negative GI work-up on multiple occasions as outlined in my March 31, 2019 note.  I had also diagnosed her with irritable bowel syndrome with constipation and diarrhea and she had incontinence of feces and decreased anal sphincter tone.  She underwent physical  therapy and has had a marked improvement and is very satisfied with her quality of life.  She was going to do the small intestinal bacterial overgrowth test but she had some concerns because she had been on multiple antibiotics and also she felt like she could not follow the restrictive diet without "passing out".  So she never did that.  Nevertheless she is very satisfied with where she is at this time.  She is fearful that her hematologist will be "mad at me" because she did not have a colonoscopy. Allergies  Allergen Reactions  . Biaxin [Clarithromycin] Hives   Current Meds  Medication Sig  . albuterol (PROVENTIL HFA;VENTOLIN HFA) 108 (90 Base) MCG/ACT inhaler Inhale 2 puffs into the lungs every 6 (six) hours as needed for wheezing or shortness of breath.  . B-D ULTRAFINE III SHORT PEN 31G X 8 MM MISC USE AS DIRECTED  . Continuous Blood Gluc Sensor (FREESTYLE LIBRE SENSOR SYSTEM) MISC Use one sensor every 10 days.  Marland Kitchen esomeprazole (NEXIUM) 40 MG capsule Take 40 mg by mouth daily.  . Ferrous Fumarate (HEMOCYTE - 106 MG FE) 324 (106 Fe) MG TABS tablet Take 1 tablet by mouth.  . fluticasone (FLONASE) 50 MCG/ACT nasal spray   . furosemide (LASIX) 20 MG tablet Take 20 mg by mouth.  . gabapentin (NEURONTIN) 300 MG capsule Take 300 mg by mouth at bedtime. 3 tablets at bedtime  . insulin aspart (NOVOLOG) 100 UNIT/ML FlexPen INJECT 25-31 UNITS INTO THE SKIN  3 (THREE) TIMES DAILY WITH MEALS.  Marland Kitchen Insulin Glargine (BASAGLAR KWIKPEN) 100 UNIT/ML SOPN INJECT 90 UNITS UNDER THE SKIN DAILY AT 10 PM  . losartan-hydrochlorothiazide (HYZAAR) 50-12.5 MG tablet Take 1 tablet by mouth daily.  . NON FORMULARY CPAP  . PARoxetine (PAXIL) 10 MG tablet Take 10 mg by mouth daily.  . simvastatin (ZOCOR) 40 MG tablet Take 40 mg by mouth daily.  . sitaGLIPtin (JANUVIA) 50 MG tablet Take 1 tablet (50 mg total) by mouth daily.  . Vitamin D, Ergocalciferol, (DRISDOL) 50000 units CAPS capsule Take 50,000 Units by mouth every  Sunday.    Past Medical History:  Diagnosis Date  . Anemia   . Arthritis   . Diabetes mellitus, type II (HCC)   . Dyspnea    on exertion  . Headache   . Hyperlipidemia   . Hypertension   . Iron deficiency anemia with multiple negative GI work-ups question AVMs 06/28/2019  . Pneumonia    h/o  . PONV (postoperative nausea and vomiting)   . Sleep apnea   . Vertigo    Past Surgical History:  Procedure Laterality Date  . ABDOMINAL HYSTERECTOMY    . APPENDECTOMY    . BLEPHAROPLASTY Bilateral 01/2018  . CARPAL TUNNEL RELEASE Left 2008  . COLONOSCOPY     multiple  . ESOPHAGOGASTRODUODENOSCOPY     multiple  . GIVENS CAPSULE STUDY    . HEMORROIDECTOMY    . UMBILICAL HERNIA REPAIR     Social History   Social History Narrative   Married and retired   Lives in [place], [place]   No hx EtOH/tobacco/drugs   family history includes Heart disease in her brother.   Review of Systems As above  Objective:   Physical Exam BP 116/60   Pulse 76   Temp (!) 97.1 F (36.2 C)   Ht 5\' 5"  (1.651 m)   Wt 257 lb 2 oz (116.6 kg)   BMI 42.79 kg/m   Total time 22 mins

## 2019-06-28 NOTE — Assessment & Plan Note (Signed)
I see no reason to repeat an endoscopic work-up based upon the information I have.  See my note of March 31, 2019 for additional details.  I am available as needed to do so.  I think she probably has small bowel AVMs that might have been seen, my records from her previous work-ups were not complete though I did see that on a problem list/history.  However she has had multiple upper endoscopies colonoscopies and capsule endoscopies and malignancy was not seen in which she has had malignancy or something fixable it should have been discovered.  I did point out that sometimes repeat examinations can turn up a fixable problem such as AVMs, and that I did not think she had cancer, but she does not want to have additional endoscopic evaluation.  I told her that was reasonable.

## 2019-06-28 NOTE — Patient Instructions (Signed)
Glad you are doing well. Come back and see [place] as needed.    I appreciate the opportunity to care for you. Stan Head, MD, [facility]

## 2019-06-28 NOTE — Assessment & Plan Note (Signed)
This is responded nicely to PT continue home exercises

## 2019-06-30 ENCOUNTER — Other Ambulatory Visit (HOSPITAL_COMMUNITY): Payer: Self-pay | Admitting: Physician Assistant

## 2019-06-30 DIAGNOSIS — Z8249 Family history of ischemic heart disease and other diseases of the circulatory system: Secondary | ICD-10-CM

## 2019-07-03 ENCOUNTER — Encounter: Payer: Self-pay | Admitting: Internal Medicine

## 2019-09-07 ENCOUNTER — Other Ambulatory Visit: Payer: Self-pay | Admitting: "Endocrinology

## 2019-09-18 ENCOUNTER — Encounter (HOSPITAL_COMMUNITY): Payer: Medicare Other

## 2019-09-21 ENCOUNTER — Ambulatory Visit: Payer: Medicare Other | Admitting: Vascular Surgery

## 2019-09-21 ENCOUNTER — Encounter (HOSPITAL_COMMUNITY): Payer: Medicare Other

## 2019-09-26 ENCOUNTER — Ambulatory Visit: Payer: Medicare Other | Admitting: "Endocrinology

## 2019-09-27 ENCOUNTER — Encounter: Payer: Self-pay | Admitting: Vascular Surgery

## 2019-09-27 ENCOUNTER — Ambulatory Visit: Payer: Medicare Other | Admitting: Vascular Surgery

## 2019-09-27 ENCOUNTER — Other Ambulatory Visit: Payer: Self-pay

## 2019-09-27 ENCOUNTER — Ambulatory Visit (HOSPITAL_COMMUNITY)
Admission: RE | Admit: 2019-09-27 | Discharge: 2019-09-27 | Disposition: A | Discharge status: Home / Self Care | Payer: Medicare Other | Source: Ambulatory Visit | Attending: Physician Assistant | Admitting: Physician Assistant

## 2019-09-27 VITALS — BP 137/64 | HR 60 | Temp 97.7°F | Resp 20 | Ht 65.0 in | Wt 262.0 lb

## 2019-09-27 DIAGNOSIS — I890 Lymphedema, not elsewhere classified: Secondary | ICD-10-CM | POA: Diagnosis not present

## 2019-09-27 DIAGNOSIS — Z136 Encounter for screening for cardiovascular disorders: Secondary | ICD-10-CM | POA: Diagnosis present

## 2019-09-27 DIAGNOSIS — Z8249 Family history of ischemic heart disease and other diseases of the circulatory system: Secondary | ICD-10-CM | POA: Insufficient documentation

## 2019-09-27 NOTE — Progress Notes (Signed)
Patient name: Tiffany Brock MRN: [id number] DOB: 29-Oct-1945 Sex: female  REASON FOR VISIT:   Follow-up of venous disease.  HPI:   Tiffany Brock is a pleasant 74 y.o. female who was seen by Leontine Locket, PA with chronic leg swelling on 06/20/2019.  The swelling was more significant on the left side.  On exam she was noted to have palpable pedal pulses.  She had venous disease and was recommended 20 to 30 mm thigh-high compression stockings, leg elevation, and weight loss.  She also had a family history of an abdominal aortic aneurysm and therefore was set up for a duplex for scanning.  My history the patient developed swelling in both lower extremities but especially on the left side after back surgery a year ago.  She has had persistent swelling on the left which limits her mobility somewhat.  She denies lymphorrhea.  She has had no ulcers.  She has had no previous abdominal surgery or inguinal surgery.  She has had no previous radiation therapy to her abdomen or groins which might be a cause of secondary lymphedema.  Her swelling is relieved with elevation but quickly returns when she is not elevating her legs.  She also gets some pain in her leg with ambulation which could potentially be venous claudication.  She is unaware of any previous history of DVT or phlebitis.  She does wear knee-high compression stockings which do help her swelling some.  She also tries to elevate her legs some.  She tries to exercise but is limited by her weight and leg pain.  Past Medical History:  Diagnosis Date  . Anemia   . Arthritis   . Diabetes mellitus, type II ([place])   . Dyspnea    on exertion  . Headache   . Hyperlipidemia   . Hypertension   . Iron deficiency anemia with multiple negative GI work-ups question AVMs 06/28/2019  . Pneumonia    h/o  . PONV (postoperative nausea and vomiting)   . Sleep apnea   . Vertigo     Family History  Problem Relation Age of Onset  . Heart disease  Brother     SOCIAL HISTORY: Social History   Tobacco Use  . Smoking status: Never Smoker  . Smokeless tobacco: Never Used  Substance Use Topics  . Alcohol use: No    Allergies  Allergen Reactions  . Biaxin [Clarithromycin] Hives    Current Outpatient Medications  Medication Sig Dispense Refill  . albuterol (PROVENTIL HFA;VENTOLIN HFA) 108 (90 Base) MCG/ACT inhaler Inhale 2 puffs into the lungs every 6 (six) hours as needed for wheezing or shortness of breath.    . B-D ULTRAFINE III SHORT PEN 31G X 8 MM MISC USE AS DIRECTED 100 each 5  . Continuous Blood Gluc Sensor (FREESTYLE LIBRE SENSOR SYSTEM) MISC Use one sensor every 10 days. 3 each 2  . esomeprazole (NEXIUM) 40 MG capsule Take 40 mg by mouth daily.  1  . Ferrous Fumarate (HEMOCYTE - 106 MG FE) 324 (106 Fe) MG TABS tablet Take 1 tablet by mouth.    . fluticasone (FLONASE) 50 MCG/ACT nasal spray     . furosemide (LASIX) 20 MG tablet Take 20 mg by mouth.    . gabapentin (NEURONTIN) 300 MG capsule Take 300 mg by mouth at bedtime. 3 tablets at bedtime    . insulin aspart (NOVOLOG) 100 UNIT/ML FlexPen INJECT 25-31 UNITS INTO THE SKIN 3 (THREE) TIMES DAILY WITH MEALS. 90 mL 0  .  Insulin Glargine (BASAGLAR KWIKPEN) 100 UNIT/ML SOPN INJECT 90 UNITS UNDER THE SKIN DAILY AT 10 PM 90 pen 0  . losartan-hydrochlorothiazide (HYZAAR) 50-12.5 MG tablet Take 1 tablet by mouth daily.    . NON FORMULARY CPAP    . PARoxetine (PAXIL) 10 MG tablet Take 10 mg by mouth daily.  0  . predniSONE (DELTASONE) 5 MG tablet Take 5 mg by mouth 2 (two) times daily.    . simvastatin (ZOCOR) 40 MG tablet Take 40 mg by mouth daily.    . sitaGLIPtin (JANUVIA) 50 MG tablet Take 1 tablet (50 mg total) by mouth daily. 90 tablet 1  . Vitamin D, Ergocalciferol, (DRISDOL) 50000 units CAPS capsule Take 50,000 Units by mouth every Sunday.   2   No current facility-administered medications for this visit.    REVIEW OF SYSTEMS:  [X]  denotes positive finding, [ ]   denotes negative finding Cardiac  Comments:  Chest pain or chest pressure:    Shortness of breath upon exertion:    Short of breath when lying flat:    Irregular heart rhythm:        Vascular    Pain in calf, thigh, or hip brought on by ambulation: x  left leg  Pain in feet at night that wakes you up from your sleep:     Blood clot in your veins:    Leg swelling:  x       Pulmonary    Oxygen at home:    Productive cough:     Wheezing:         Neurologic    Sudden weakness in arms or legs:     Sudden numbness in arms or legs:     Sudden onset of difficulty speaking or slurred speech:    Temporary loss of vision in one eye:     Problems with dizziness:         Gastrointestinal    Blood in stool:     Vomited blood:         Genitourinary    Burning when urinating:     Blood in urine:        Psychiatric    Major depression:         Hematologic    Bleeding problems:    Problems with blood clotting too easily:        Skin    Rashes or ulcers:        Constitutional    Fever or chills:     PHYSICAL EXAM:   Vitals:   09/27/19 1046  BP: 137/64  Pulse: 60  Resp: 20  Temp: 97.7 F (36.5 C)  SpO2: 98%  Weight: 262 lb (118.8 kg)  Height: 5\' 5"  (1.651 m)   Body mass index is 43.6 kg/m.  GENERAL: The patient is a well-nourished female, in no acute distress. The vital signs are documented above. CARDIAC: There is a regular rate and rhythm.  VASCULAR: I do not detect carotid bruits. She has palpable dorsalis pedis pulses bilaterally. She does have some varicosities along the anterior lateral aspect of her left thigh likely being fed by her anterior accessory saphenous vein.  These veins are not especially large. She has significant bilateral lower extremity swelling which is more significant on the left side.  RIGHT LEG: Knee 17 inches, calf 15-1/2 inches, ankle 10-1/4 inches, foot 9-3/4 inches.  LEFT LEG: Knee 17 inches, calf 16-1/4 inches, ankle 11-1/4 inches,  foot 10 inches. PULMONARY: There is good  air exchange bilaterally without wheezing or rales. ABDOMEN: Soft and non-tender with normal pitched bowel sounds.  MUSCULOSKELETAL: There are no major deformities or cyanosis. NEUROLOGIC: No focal weakness or paresthesias are detected. SKIN: There are no ulcers or rashes noted. PSYCHIATRIC: The patient has a normal affect.  DATA:    DUPLEX ABDOMINAL AORTA: I have independently interpreted the duplex of the abdominal aorta that was done today.  There was limited visualization because of her size but the maximum diameter of the aorta that we could find was 3.5 cm.  The right common iliac artery measures 1.5 cm in maximum diameter.  The left common iliac artery measures 1.4 cm in maximum diameter.  VENOUS DUPLEX: I reviewed her previous venous duplex scan that was done in December 2020.  This was of the left leg only.  She had no evidence of DVT or superficial venous thrombosis.  She had no deep venous reflux.  She had no superficial venous reflux in the great saphenous vein or small saphenous vein.  She did have an anterior accessory saphenous vein on the left which was mildly dilated.  MEDICAL ISSUES:   BILATERAL LEG SWELLING: This patient has bilateral lower extremity leg swelling which is more significant on the left side.  She has no significant deep venous reflux.  Her only superficial venous reflux on the left is an anterior accessory saphenous vein which I do not think would explain her swelling.  Based on her exam I think a lot of her swelling is related to lymphedema.  She has some nonpitting edema and significant swelling on the dorsum of the foot.  We have discussed the importance of intermittent leg elevation is proper positioning for this.  I encouraged her to continue to wear her compression stockings.  We discussed the importance of exercise specifically walking and water aerobics.  We also discussed the importance of weight management as  central obesity especially increases lower extremity venous pressure.  I have encouraged her to avoid prolonged sitting and standing.  If her swelling worsens I think perhaps in the future she could be considered for a pneumatic compression device.  The only other consideration would be to obtain a CT venogram to look for compression of the left iliac vein (May Thurner syndrome) however given her obesity I think that she would be at increased risk for complications and so we will only consider this if her symptoms worsen significantly.  I plan on seeing her back in 6 months.  She knows to call sooner if she has problems.  SCREENING FOR ABDOMINAL AORTIC ANEURYSM: The patient has some slight ectasia of the infrarenal aorta but no aneurysm is identified.  I think it would be worth repeating her study in 5 years so we will schedule that when she returns for her next visit.  Waverly Ferrari [facility] [phone]

## 2019-10-09 LAB — BASIC METABOLIC PANEL
BUN: 31 — AB (ref 4–21)
Creatinine: 1.8 — AB (ref 0.5–1.1)

## 2019-10-09 LAB — LIPID PANEL
Cholesterol: 148 (ref 0–200)
HDL: 46 (ref 35–70)
LDL Cholesterol: 77
Triglycerides: 127 (ref 40–160)

## 2019-10-09 LAB — COMPREHENSIVE METABOLIC PANEL: Calcium: 9.2 (ref 8.7–10.7)

## 2019-10-09 LAB — TSH: TSH: 7.11 — AB (ref 0.41–5.90)

## 2019-10-09 LAB — VITAMIN D 25 HYDROXY (VIT D DEFICIENCY, FRACTURES): Vit D, 25-Hydroxy: 24

## 2019-10-12 ENCOUNTER — Ambulatory Visit: Payer: Medicare Other | Admitting: "Endocrinology

## 2019-10-12 ENCOUNTER — Other Ambulatory Visit: Payer: Self-pay

## 2019-10-12 ENCOUNTER — Encounter: Payer: Self-pay | Admitting: "Endocrinology

## 2019-10-12 VITALS — BP 120/71 | HR 75 | Ht 65.0 in | Wt 259.8 lb

## 2019-10-12 DIAGNOSIS — N183 Chronic kidney disease, stage 3 unspecified: Secondary | ICD-10-CM | POA: Diagnosis not present

## 2019-10-12 DIAGNOSIS — IMO0002 Reserved for concepts with insufficient information to code with codable children: Secondary | ICD-10-CM

## 2019-10-12 DIAGNOSIS — Z794 Long term (current) use of insulin: Secondary | ICD-10-CM

## 2019-10-12 DIAGNOSIS — E782 Mixed hyperlipidemia: Secondary | ICD-10-CM | POA: Diagnosis not present

## 2019-10-12 DIAGNOSIS — E038 Other specified hypothyroidism: Secondary | ICD-10-CM

## 2019-10-12 DIAGNOSIS — E1122 Type 2 diabetes mellitus with diabetic chronic kidney disease: Secondary | ICD-10-CM | POA: Diagnosis not present

## 2019-10-12 DIAGNOSIS — E1165 Type 2 diabetes mellitus with hyperglycemia: Secondary | ICD-10-CM | POA: Diagnosis not present

## 2019-10-12 DIAGNOSIS — I10 Essential (primary) hypertension: Secondary | ICD-10-CM | POA: Diagnosis not present

## 2019-10-12 DIAGNOSIS — E039 Hypothyroidism, unspecified: Secondary | ICD-10-CM

## 2019-10-12 LAB — POCT GLYCOSYLATED HEMOGLOBIN (HGB A1C): Hemoglobin A1C: 8.4 % — AB (ref 4.0–5.6)

## 2019-10-12 MED ORDER — BASAGLAR KWIKPEN 100 UNIT/ML ~~LOC~~ SOPN: 80.0000 [IU] | PEN_INJECTOR | Freq: Every day | SUBCUTANEOUS | 2 refills | Status: DC

## 2019-10-12 NOTE — Progress Notes (Signed)
10/12/2019   Subjective:    Patient ID: Tiffany Brock, female    DOB: 09-30-1945.  She is here for follow-up  for management of type 2 diabetes, hyperlipidemia, hypertension.  PCP: Garald Braver  Past Medical History:  Diagnosis Date  . Anemia   . Arthritis   . Diabetes mellitus, type II ([facility])   . Dyspnea    on exertion  . Headache   . Hyperlipidemia   . Hypertension   . Iron deficiency anemia with multiple negative GI work-ups question AVMs 06/28/2019  . Pneumonia    h/o  . PONV (postoperative nausea and vomiting)   . Sleep apnea   . Vertigo    Past Surgical History:  Procedure Laterality Date  . ABDOMINAL HYSTERECTOMY    . APPENDECTOMY    . BLEPHAROPLASTY Bilateral 01/2018  . CARPAL TUNNEL RELEASE Left 2008  . COLONOSCOPY     multiple  . ESOPHAGOGASTRODUODENOSCOPY     multiple  . GIVENS CAPSULE STUDY    . HEMORROIDECTOMY    . UMBILICAL HERNIA REPAIR     Social History   Socioeconomic History  . Marital status: Married    Spouse name: Not on file  . Number of children: Not on file  . Years of education: Not on file  . Highest education level: Not on file  Occupational History  . Not on file  Tobacco Use  . Smoking status: Never Smoker  . Smokeless tobacco: Never Used  Substance and Sexual Activity  . Alcohol use: No  . Drug use: No  . Sexual activity: Not on file  Other Topics Concern  . Not on file  Social History Narrative   Married and retired   Lives in [place], [place]   No hx EtOH/tobacco/drugs   Social Determinants of Health   Financial Resource Strain:   . Difficulty of Paying Living Expenses:   Food Insecurity:   . Worried About Programme researcher, broadcasting/film/video in the Last Year:   . Barista in the Last Year:   Transportation Needs:   . Freight forwarder (Medical):   Marland Kitchen Lack of Transportation (Non-Medical):   Physical Activity:   . Days of Exercise per Week:   . Minutes of Exercise per Session:   Stress:   . Feeling of Stress :    Social Connections:   . Frequency of Communication with Friends and Family:   . Frequency of Social Gatherings with Friends and Family:   . Attends Religious Services:   . Active Member of Clubs or Organizations:   . Attends Banker Meetings:   Marland Kitchen Marital Status:    Outpatient Encounter Medications as of 10/12/2019  Medication Sig  . albuterol (PROVENTIL HFA;VENTOLIN HFA) 108 (90 Base) MCG/ACT inhaler Inhale 2 puffs into the lungs every 6 (six) hours as needed for wheezing or shortness of breath.  . B-D ULTRAFINE III SHORT PEN 31G X 8 MM MISC USE AS DIRECTED  . Continuous Blood Gluc Sensor (FREESTYLE LIBRE SENSOR SYSTEM) MISC Use one sensor every 10 days.  Marland Kitchen esomeprazole (NEXIUM) 40 MG capsule Take 40 mg by mouth daily.  . Ferrous Fumarate (HEMOCYTE - 106 MG FE) 324 (106 Fe) MG TABS tablet Take 1 tablet by mouth.  . fluticasone (FLONASE) 50 MCG/ACT nasal spray   . furosemide (LASIX) 40 MG tablet Take 40 mg by mouth daily.  Marland Kitchen gabapentin (NEURONTIN) 300 MG capsule Take 300 mg by mouth at bedtime. 3 tablets at bedtime  .  insulin aspart (NOVOLOG) 100 UNIT/ML FlexPen INJECT 25-31 UNITS INTO THE SKIN 3 (THREE) TIMES DAILY WITH MEALS.  Marland Kitchen Insulin Glargine (BASAGLAR KWIKPEN) 100 UNIT/ML Inject 0.8 mLs (80 Units total) into the skin at bedtime.  Marland Kitchen losartan-hydrochlorothiazide (HYZAAR) 50-12.5 MG tablet Take 1 tablet by mouth daily.  . NON FORMULARY CPAP  . PARoxetine (PAXIL) 10 MG tablet Take 10 mg by mouth daily.  . simvastatin (ZOCOR) 40 MG tablet Take 40 mg by mouth daily.  . sitaGLIPtin (JANUVIA) 50 MG tablet Take 1 tablet (50 mg total) by mouth daily.  Marland Kitchen spironolactone (ALDACTONE) 50 MG tablet Take 50 mg by mouth daily.  . Vitamin D, Ergocalciferol, (DRISDOL) 50000 units CAPS capsule Take 50,000 Units by mouth every Sunday.   . [DISCONTINUED] furosemide (LASIX) 20 MG tablet Take 40 mg by mouth.   . [DISCONTINUED] Insulin Glargine (BASAGLAR KWIKPEN) 100 UNIT/ML SOPN INJECT 90 UNITS  UNDER THE SKIN DAILY AT 10 PM  . [DISCONTINUED] predniSONE (DELTASONE) 5 MG tablet Take 5 mg by mouth 2 (two) times daily.   No facility-administered encounter medications on file as of 10/12/2019.   ALLERGIES: Allergies  Allergen Reactions  . Biaxin [Clarithromycin] Hives   VACCINATION STATUS:  There is no immunization history on file for this patient.  Diabetes She presents for her follow-up diabetic visit. She has type 2 diabetes mellitus. Onset time: She was diagnosed at approximate age of 46 years. Her disease course has been fluctuating. There are no hypoglycemic associated symptoms. Pertinent negatives for hypoglycemia include no confusion, headaches, pallor or seizures. Associated symptoms include fatigue. Pertinent negatives for diabetes include no blurred vision, no chest pain, no polydipsia, no polyphagia and no polyuria. There are no hypoglycemic complications. Symptoms are improving. Risk factors for coronary artery disease include diabetes mellitus, dyslipidemia, family history, hypertension, obesity and sedentary lifestyle. Current diabetic treatment includes insulin injections and oral agent (dual therapy). Her weight is fluctuating minimally. She is following a generally unhealthy diet. When asked about meal planning, she reported none. She has had a previous visit with a dietitian. She never participates in exercise. Her home blood glucose trend is fluctuating minimally. Her breakfast blood glucose range is generally 140-180 mg/dl. Her lunch blood glucose range is generally 180-200 mg/dl. Her dinner blood glucose range is generally 180-200 mg/dl. Her bedtime blood glucose range is generally 180-200 mg/dl. Her overall blood glucose range is 180-200 mg/dl. (Interpretation of her CGM printout show that her average blood glucose is 187 for the last 15 days, timing ranges 46%.  50% above range, no hypoglycemia.  Her point-of-care A1c is 7.9%.) An ACE inhibitor/angiotensin II receptor  blocker is being taken. She sees a podiatrist.Eye exam is current.  Hyperlipidemia This is a chronic problem. The current episode started more than 1 year ago. The problem is controlled. Exacerbating diseases include diabetes and obesity. Pertinent negatives include no chest pain, myalgias or shortness of breath. Current antihyperlipidemic treatment includes statins. Risk factors for coronary artery disease include diabetes mellitus, dyslipidemia, family history, obesity, hypertension and a sedentary lifestyle.  Hypertension This is a chronic problem. The current episode started more than 1 year ago. Pertinent negatives include no blurred vision, chest pain, headaches, palpitations or shortness of breath. Risk factors for coronary artery disease include dyslipidemia, diabetes mellitus, family history, obesity and sedentary lifestyle. Past treatments include angiotensin blockers.    Review of systems  Constitutional: + Minimally fluctuating body weight,  current  Body mass index is 43.23 kg/m. , + fatigue, no subjective hyperthermia,  no subjective hypothermia Eyes: no blurry vision, no xerophthalmia ENT: no sore throat, no nodules palpated in throat, no dysphagia/odynophagia, no hoarseness Cardiovascular: no Chest Pain, no Shortness of Breath, no palpitations, no leg swelling Respiratory: no cough, no shortness of breath Gastrointestinal: no Nausea/Vomiting/Diarhhea Musculoskeletal: no muscle/joint aches Skin: no rashes, no hyperemia Neurological: no tremors, no numbness, no tingling, no dizziness Psychiatric: no depression, no anxiety   Objective:    BP 120/71   Pulse 75   Ht 5\' 5"  (1.651 m)   Wt 259 lb 12.8 oz (117.8 kg)   BMI 43.23 kg/m   Wt Readings from Last 3 Encounters:  10/12/19 259 lb 12.8 oz (117.8 kg)  09/27/19 262 lb (118.8 kg)  06/28/19 257 lb 2 oz (116.6 kg)     Physical Exam- Limited  Constitutional:  Body mass index is 43.23 kg/m. , not in acute distress, normal  state of mind Eyes:  EOMI, no exophthalmos Neck: Supple  Respiratory: Adequate breathing efforts Musculoskeletal: no gross deformities, strength intact in all four extremities, no gross restriction of joint movements Skin:  no rashes, no hyperemia Neurological: no tremor with outstretched hands,    CMP     Component Value Date/Time   NA 140 10/12/2018 0000   K 3.9 10/12/2018 0000   CL 102 07/04/2018 1407   CO2 24 07/04/2018 1407   GLUCOSE 293 (H) 07/04/2018 1407   BUN 31 (A) 10/09/2019 0000   CREATININE 1.8 (A) 10/09/2019 0000   CREATININE 1.30 (H) 07/04/2018 1407   CALCIUM 9.2 10/09/2019 0000   GFRNONAA 41 (L) 07/04/2018 1407   GFRAA 48 (L) 07/04/2018 1407   Diabetic Labs (most recent): Lab Results  Component Value Date   HGBA1C 8.4 (A) 10/12/2019   HGBA1C 7.9 (A) 06/27/2019   HGBA1C 7.4 02/16/2019    Lipid Panel     Component Value Date/Time   CHOL 148 10/09/2019 0000   TRIG 127 10/09/2019 0000   HDL 46 10/09/2019 0000   LDLCALC 77 10/09/2019 0000     Assessment & Plan:   1. 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 - She presents with blood glucose profile significantly fluctuating even while using CGM device.  Analysis of her CGM device shows 46% time in range, 54% above range.   She has no hypoglycemia.  Her point-of-care A1c is 8.4% increasing from 7.9%.     Her diabetes is complicated by  CKD, obesity , and sedentary life and patient remains at a high risk for more acute and chronic complications of diabetes which include CAD, CVA, CKD, retinopathy, and neuropathy. These are all discussed in detail with the patient.  - I have counseled the patient on diet management and weight loss, by adopting a carbohydrate restricted/protein rich diet.  - she  admits there is a room for improvement in her  diet and drink choices. -  Suggestion is made for her to avoid simple carbohydrates  from her diet including Cakes, Sweet Desserts / Pastries, Ice Cream, Soda (diet and regular), Sweet Tea, Candies, Chips, Cookies, Sweet Pastries,  Store Bought Juices, Alcohol in Excess of  1-2 drinks a day, Artificial Sweeteners, Coffee Creamer, and "Sugar-free" Products. This will help patient to have stable blood glucose profile and potentially avoid unintended weight gain.   - I encouraged the patient to switch to unprocessed or  minimally processed complex starch and increased protein intake (animal or plant source), fruits, and vegetables.  - Patient is advised to stick to a routine mealtimes to eat 3 meals  a day and avoid unnecessary snacks ( to snack only to correct hypoglycemia).   - I have approached patient with the following individualized plan to manage diabetes and patient agrees:   -  Based on her blood glucose profile and glycemic burden she will continue to need intensive treatment with basal/bolus insulin in order for her to maintain control of diabetes to target.    -She has a tendency for binge consumption of sweets and sweetened beverages, promises to do better.    -She has some rare, random hypoglycemia in the early hours, advised to lower her Basaglar to 80 units at bedtime, continue NovoLog  25 units 3 times a day before meals for pre-meal blood glucose above 90 mg/dL associated with strict monitoring of glucose 4 times a day-before meals and at bedtime.  - She is benefiting from  continued glucose monitoring, is advised to wear it at all times. -Patient is encouraged to call clinic for blood glucose levels less than 70 or above 300 mg /dl.  -She is advised to continue Januvia 50 mg p.o. daily with breakfast.   -Her renal function is not improving, advised to stay away from metformin treatment for now.  Need nephrology referral through her PMD.  - Patient specific target  A1c;  LDL,  HDL, Triglycerides, were discussed in detail.  2) BP/HTN: Her blood pressure is controlled to target.     She is advised to continue her current blood pressure medications including Hyzaar.  3) Lipids/HPL: Her recent lipid panel showed controlled LDL at 67.  She is advised to continue simvastatin 40 mg p.o. nightly.   Side effects and precautions discussed with her.  4) subclinical hypothyroidism: Normal free T4, elevated TSH of 7.1.  She will not need intervention at this time.  She will have repeat thyroid function test before her next visit.   5) obesity: Her BMI is 43.2-clearly complicating her diabetes care.  She is a candidate for modest weight loss.  Carbs management discussed with her in detail.   6) Chronic Care/Health Maintenance:  -Patient is on ARB and Statin medications and encouraged to continue to follow up with Ophthalmology, Podiatrist at least yearly or according to recommendations, and advised to   stay away from smoking. I have recommended yearly flu vaccine and pneumonia vaccination at least every 5 years; and  sleep for at least 7 hours a day.  -Given her stage 3-4 renal insufficiency, she would benefit from early initiation of nephrology care.  Her GFR is 28, normal potassium.  She is advised to discuss this referral with her PMD.  Patient cannot exercise optimally.  She was accompanied by her husband to clinic today. Her fatigue is likely multifactorial including sleep apnea.  Patient has a high risk for coronary artery disease.  She may benefit from stress test.  She already has appointment with her cardiologist, encouraged to keep that appointment.  - I advised patient to maintain close follow up with Richarda BladeElliott, Dianne E for primary care needs.  - Time spent on this patient care encounter:  35 min, of which > 50% was spent in  counseling and the rest reviewing her blood glucose logs , discussing her hypoglycemia and hyperglycemia episodes, reviewing her current and   previous labs / studies  ( including abstraction from other facilities)  and medications  doses and developing a  long term treatment plan and documenting her care.   Please refer to Patient Instructions for Blood Glucose Monitoring and Insulin/Medications Dosing Guide"  in media tab for additional information. Please  also refer to " Patient Self Inventory" in the Media  tab for reviewed elements of pertinent patient history.  Taitum Menton Ruzich participated in the discussions, expressed understanding, and voiced agreement with the above plans.  All questions were answered to her satisfaction. she is encouraged to contact clinic should she have any questions or concerns prior to her return visit.   Follow up plan: - Return in about 3 months (around 01/11/2020) for Bring Meter and Logs- A1c in Office, Follow up with Pre-visit Labs.  Marquis Lunch, MD Phone: [phone]  Fax: [phone]  This note was partially dictated with voice recognition software. Similar sounding words can be transcribed inadequately or may not  be corrected upon review.  10/12/2019, 5:15 PM

## 2019-10-12 NOTE — Patient Instructions (Signed)

## 2019-11-29 ENCOUNTER — Encounter: Payer: Self-pay | Admitting: "Endocrinology

## 2019-12-29 IMAGING — CR DG LUMBAR SPINE 2-3V
2 series · 2 of 2 positions shown · non-contrast
Comparison: Lumbar spine CT 02/03/2018

CLINICAL DATA: L5-S1 decompression.

EXAM:
LUMBAR SPINE - 2-3 VIEW

[lateral (1 of 2)]
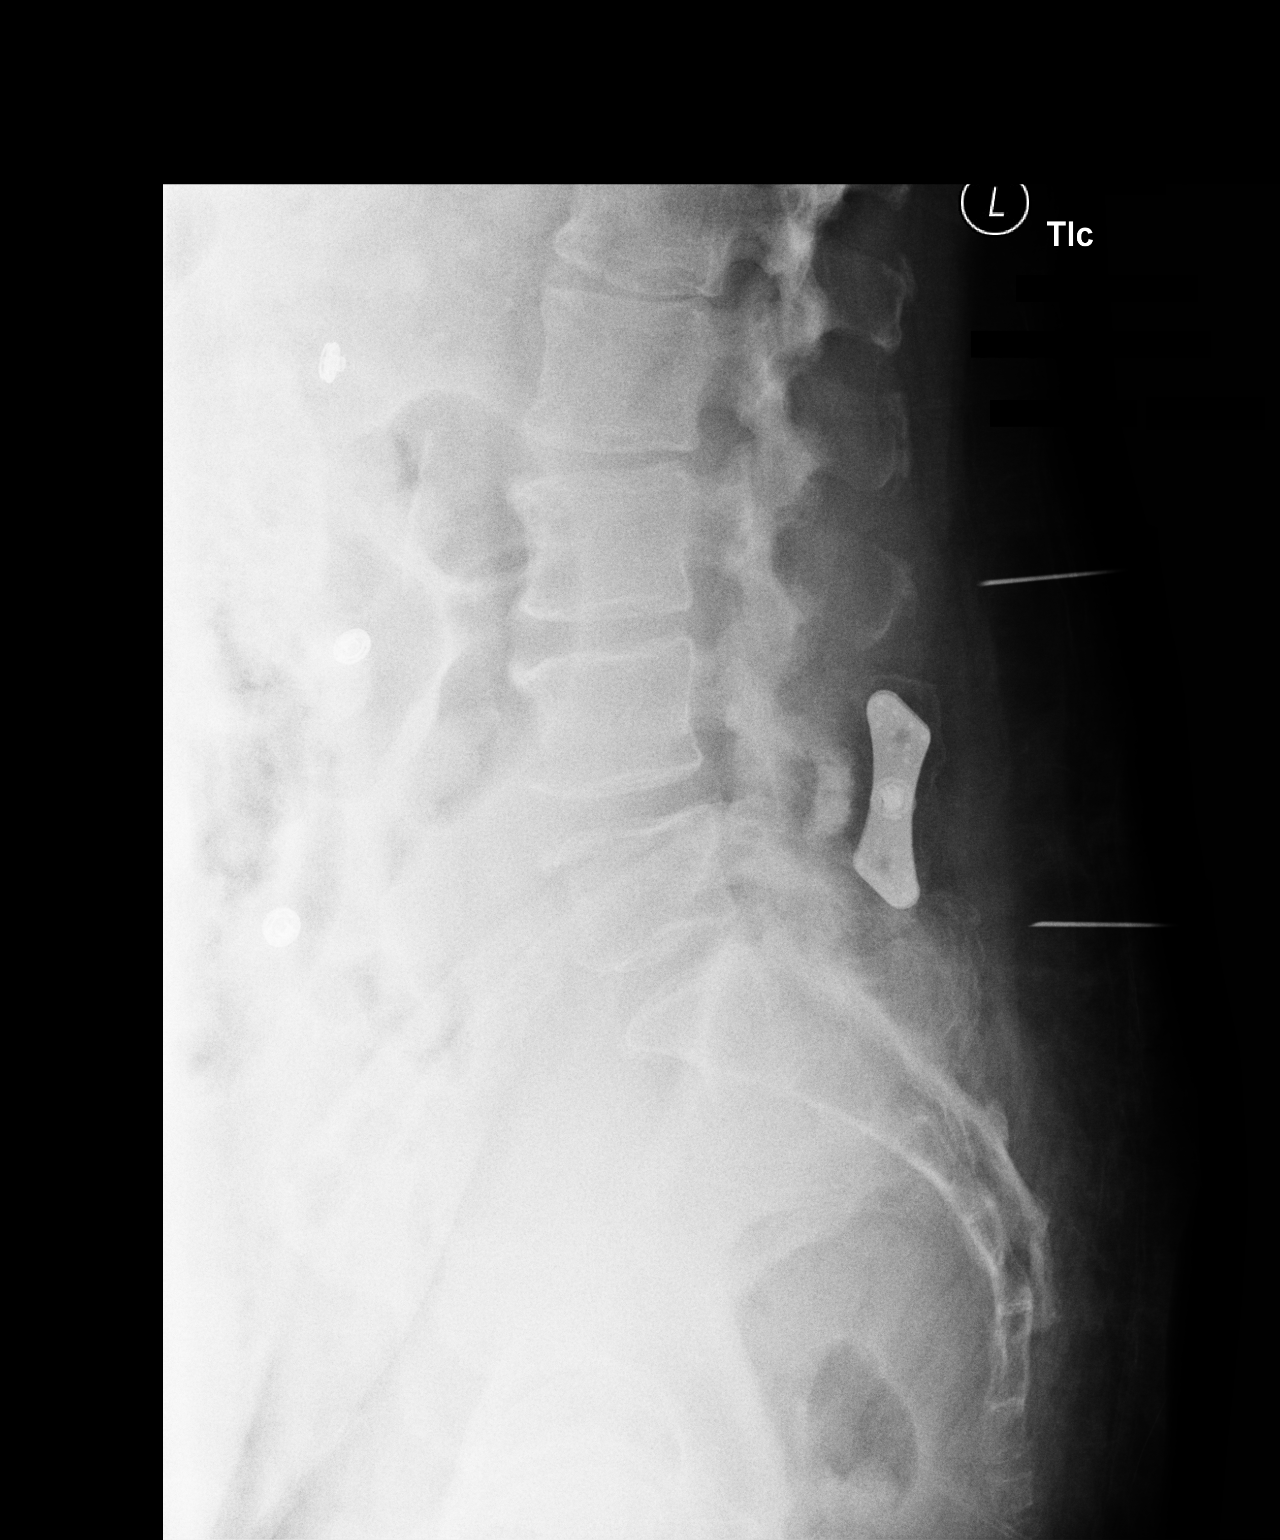

[lateral (2 of 2)]
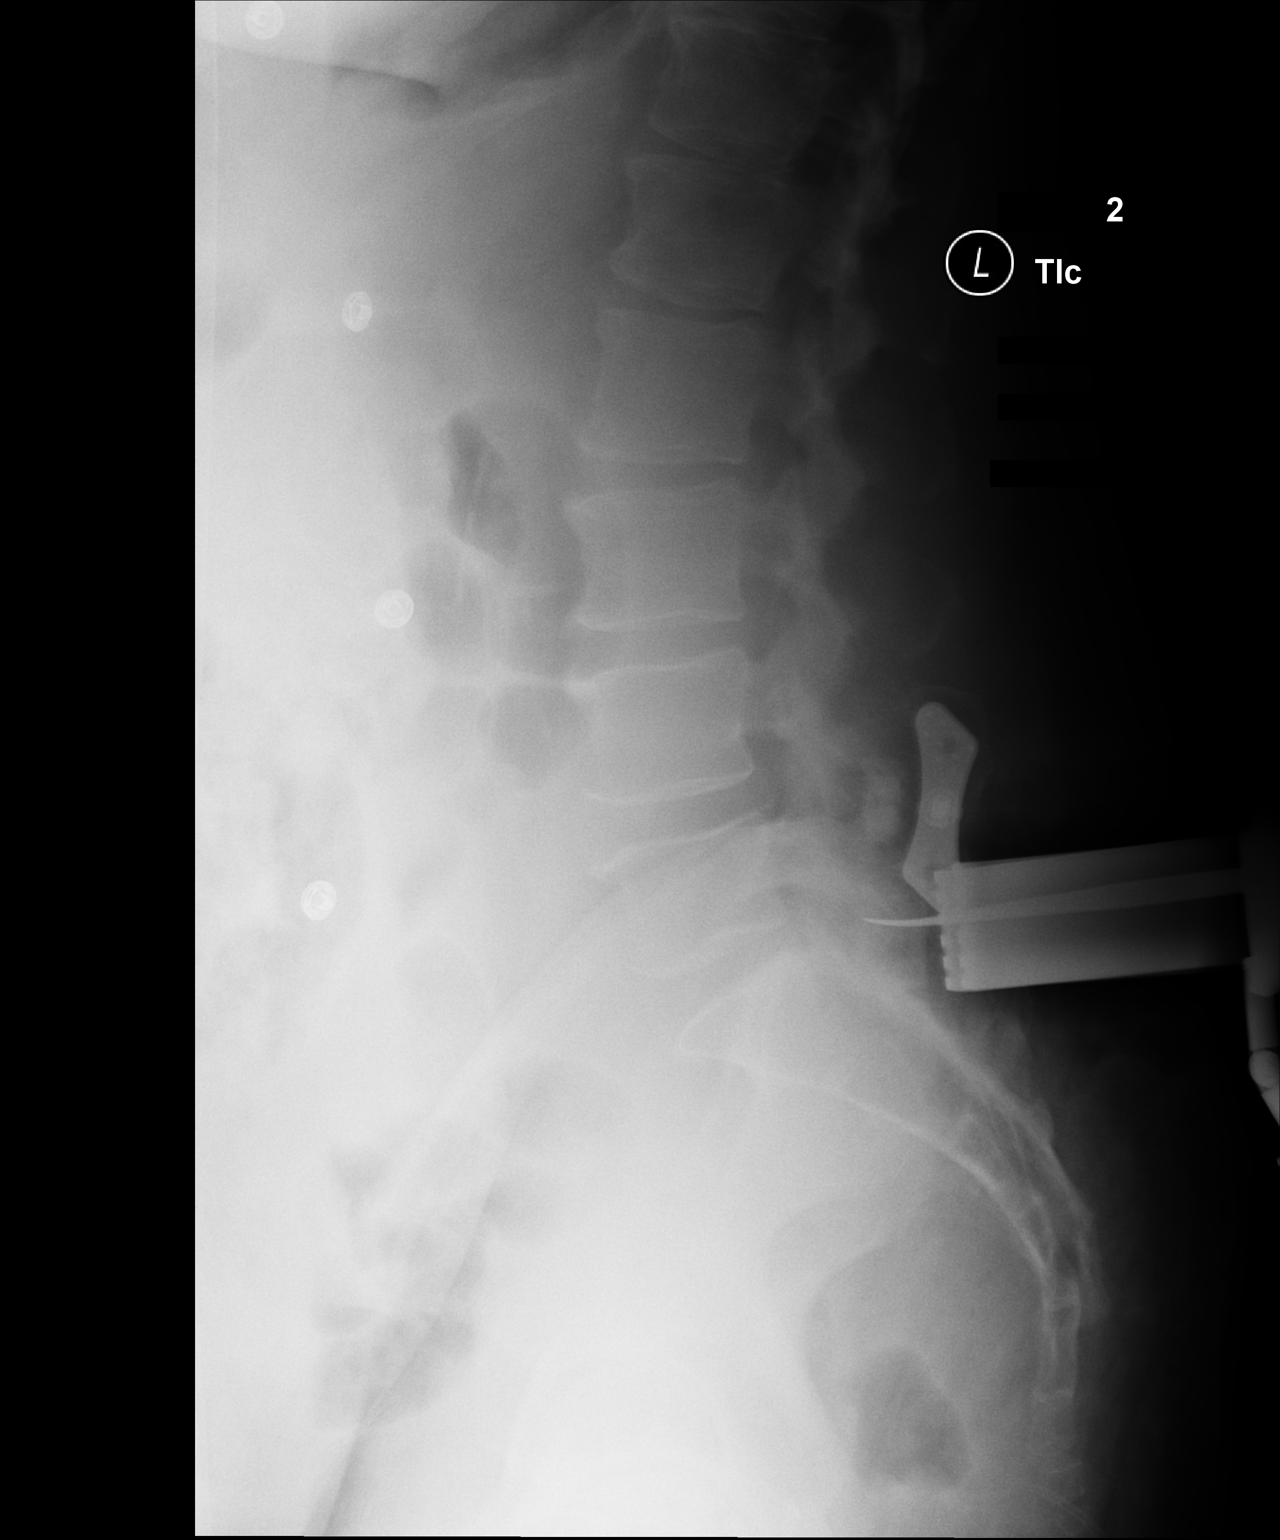

[2 of 2 positions shown; findings below may reference images not displayed]

FINDINGS: 2 lateral intraoperative radiographs of the lumbar spine are
provided. The first demonstrates needles posterior to the L3 and L5
spinous processes. The second image demonstrates a surgical probe
just inferior to the L4-5 interspinous fixation device with tip
directed towards the L5-S1 disc space.
IMPRESSION: Intraoperative localization as above.

## 2020-01-11 LAB — HEPATIC FUNCTION PANEL
ALT: 25 (ref 7–35)
AST: 22 (ref 13–35)
Alkaline Phosphatase: 83 (ref 25–125)
Bilirubin, Total: 0.6

## 2020-01-11 LAB — BASIC METABOLIC PANEL
BUN: 23 — AB (ref 4–21)
CO2: 27 — AB (ref 13–22)
Chloride: 103 (ref 99–108)
Creatinine: 1.5 — AB (ref 0.5–1.1)
Glucose: 329
Potassium: 4.4 (ref 3.4–5.3)
Sodium: 136 — AB (ref 137–147)

## 2020-01-11 LAB — COMPREHENSIVE METABOLIC PANEL
Albumin: 3.1 — AB (ref 3.5–5.0)
Calcium: 8.4 — AB (ref 8.7–10.7)
GFR calc Af Amer: 42
GFR calc non Af Amer: 35

## 2020-01-11 LAB — TSH: TSH: 1.79 (ref 0.41–5.90)

## 2020-01-16 ENCOUNTER — Other Ambulatory Visit: Payer: Self-pay

## 2020-01-16 ENCOUNTER — Encounter: Payer: Self-pay | Admitting: "Endocrinology

## 2020-01-16 ENCOUNTER — Ambulatory Visit: Payer: Medicare Other | Admitting: "Endocrinology

## 2020-01-16 VITALS — BP 110/56 | HR 72 | Ht 65.0 in | Wt 262.6 lb

## 2020-01-16 DIAGNOSIS — E039 Hypothyroidism, unspecified: Secondary | ICD-10-CM | POA: Diagnosis not present

## 2020-01-16 DIAGNOSIS — E782 Mixed hyperlipidemia: Secondary | ICD-10-CM | POA: Diagnosis not present

## 2020-01-16 DIAGNOSIS — I10 Essential (primary) hypertension: Secondary | ICD-10-CM

## 2020-01-16 DIAGNOSIS — N183 Chronic kidney disease, stage 3 unspecified: Secondary | ICD-10-CM | POA: Diagnosis not present

## 2020-01-16 DIAGNOSIS — E559 Vitamin D deficiency, unspecified: Secondary | ICD-10-CM | POA: Insufficient documentation

## 2020-01-16 DIAGNOSIS — E1165 Type 2 diabetes mellitus with hyperglycemia: Secondary | ICD-10-CM

## 2020-01-16 DIAGNOSIS — IMO0002 Reserved for concepts with insufficient information to code with codable children: Secondary | ICD-10-CM

## 2020-01-16 DIAGNOSIS — Z794 Long term (current) use of insulin: Secondary | ICD-10-CM

## 2020-01-16 DIAGNOSIS — E1122 Type 2 diabetes mellitus with diabetic chronic kidney disease: Secondary | ICD-10-CM

## 2020-01-16 DIAGNOSIS — E038 Other specified hypothyroidism: Secondary | ICD-10-CM

## 2020-01-16 LAB — POCT GLYCOSYLATED HEMOGLOBIN (HGB A1C): Hemoglobin A1C: 7.8 % — AB (ref 4.0–5.6)

## 2020-01-16 NOTE — Progress Notes (Signed)
01/16/2020  Endocrinology follow-up note   Subjective:    Patient ID: Tiffany Brock, female    DOB: 07/28/1945.  She is here for follow-up  for management of type 2 diabetes, hyperlipidemia, hypertension.  PCP: Garald Braver  Past Medical History:  Diagnosis Date  . Anemia   . Arthritis   . Diabetes mellitus, type II (HCC)   . Dyspnea    on exertion  . Headache   . Hyperlipidemia   . Hypertension   . Iron deficiency anemia with multiple negative GI work-ups question AVMs 06/28/2019  . Pneumonia    h/o  . PONV (postoperative nausea and vomiting)   . Sleep apnea   . Vertigo    Past Surgical History:  Procedure Laterality Date  . ABDOMINAL HYSTERECTOMY    . APPENDECTOMY    . BLEPHAROPLASTY Bilateral 01/2018  . CARPAL TUNNEL RELEASE Left 2008  . COLONOSCOPY     multiple  . ESOPHAGOGASTRODUODENOSCOPY     multiple  . GIVENS CAPSULE STUDY    . HEMORROIDECTOMY    . UMBILICAL HERNIA REPAIR     Social History   Socioeconomic History  . Marital status: Married    Spouse name: Not on file  . Number of children: Not on file  . Years of education: Not on file  . Highest education level: Not on file  Occupational History  . Not on file  Tobacco Use  . Smoking status: Never Smoker  . Smokeless tobacco: Never Used  Vaping Use  . Vaping Use: Never used  Substance and Sexual Activity  . Alcohol use: No  . Drug use: No  . Sexual activity: Not on file  Other Topics Concern  . Not on file  Social History Narrative   Married and retired   Lives in [place], [place]   No hx EtOH/tobacco/drugs   Social Determinants of Health   Financial Resource Strain:   . Difficulty of Paying Living Expenses:   Food Insecurity:   . Worried About Programme researcher, broadcasting/film/video in the Last Year:   . Barista in the Last Year:   Transportation Needs:   . Freight forwarder (Medical):   Marland Kitchen Lack of Transportation (Non-Medical):   Physical Activity:   . Days of Exercise per Week:    . Minutes of Exercise per Session:   Stress:   . Feeling of Stress :   Social Connections:   . Frequency of Communication with Friends and Family:   . Frequency of Social Gatherings with Friends and Family:   . Attends Religious Services:   . Active Member of Clubs or Organizations:   . Attends Banker Meetings:   Marland Kitchen Marital Status:    Outpatient Encounter Medications as of 01/16/2020  Medication Sig  . albuterol (PROVENTIL HFA;VENTOLIN HFA) 108 (90 Base) MCG/ACT inhaler Inhale 2 puffs into the lungs every 6 (six) hours as needed for wheezing or shortness of breath.  . B-D ULTRAFINE III SHORT PEN 31G X 8 MM MISC USE AS DIRECTED  . Continuous Blood Gluc Sensor (FREESTYLE LIBRE SENSOR SYSTEM) MISC Use one sensor every 10 days.  Marland Kitchen esomeprazole (NEXIUM) 40 MG capsule Take 40 mg by mouth daily.  . Ferrous Fumarate (HEMOCYTE - 106 MG FE) 324 (106 Fe) MG TABS tablet Take 1 tablet by mouth.  . fluticasone (FLONASE) 50 MCG/ACT nasal spray   . furosemide (LASIX) 40 MG tablet Take 40 mg by mouth daily.  Marland Kitchen gabapentin (NEURONTIN) 300 MG  capsule Take 300 mg by mouth at bedtime. 3 tablets at bedtime  . insulin aspart (NOVOLOG) 100 UNIT/ML FlexPen INJECT 25-31 UNITS INTO THE SKIN 3 (THREE) TIMES DAILY WITH MEALS.  Marland Kitchen Insulin Glargine (BASAGLAR KWIKPEN) 100 UNIT/ML Inject 0.8 mLs (80 Units total) into the skin at bedtime.  Marland Kitchen losartan-hydrochlorothiazide (HYZAAR) 50-12.5 MG tablet Take 1 tablet by mouth daily.  . NON FORMULARY CPAP  . PARoxetine (PAXIL) 10 MG tablet Take 10 mg by mouth daily.  . simvastatin (ZOCOR) 40 MG tablet Take 40 mg by mouth daily.  . sitaGLIPtin (JANUVIA) 50 MG tablet Take 1 tablet (50 mg total) by mouth daily.  Marland Kitchen spironolactone (ALDACTONE) 50 MG tablet Take 50 mg by mouth daily.  . Vitamin D, Ergocalciferol, (DRISDOL) 50000 units CAPS capsule Take 50,000 Units by mouth every Sunday.    No facility-administered encounter medications on file as of 01/16/2020.    ALLERGIES: Allergies  Allergen Reactions  . Biaxin [Clarithromycin] Hives   VACCINATION STATUS:  There is no immunization history on file for this patient.  Diabetes She presents for her follow-up diabetic visit. She has type 2 diabetes mellitus. Onset time: She was diagnosed at approximate age of 46 years. Her disease course has been improving. There are no hypoglycemic associated symptoms. Pertinent negatives for hypoglycemia include no confusion, headaches, pallor or seizures. Associated symptoms include fatigue. Pertinent negatives for diabetes include no blurred vision, no chest pain, no polydipsia, no polyphagia and no polyuria. There are no hypoglycemic complications. Symptoms are improving. Risk factors for coronary artery disease include diabetes mellitus, dyslipidemia, family history, hypertension, obesity and sedentary lifestyle. Current diabetic treatment includes insulin injections and oral agent (dual therapy). Her weight is fluctuating minimally. She is following a generally unhealthy diet. When asked about meal planning, she reported none. She has had a previous visit with a dietitian. She never participates in exercise. Her home blood glucose trend is fluctuating minimally. Her breakfast blood glucose range is generally 140-180 mg/dl. Her lunch blood glucose range is generally 140-180 mg/dl. Her dinner blood glucose range is generally 180-200 mg/dl. Her bedtime blood glucose range is generally 180-200 mg/dl. Her overall blood glucose range is 180-200 mg/dl. (Interpretation of her CGM printout show that her average blood glucose is 176, 54% time in range, 45% above range, 1% hypoglycemia.  Her point-of-care A1c is 7.8%, generally improving.  ) An ACE inhibitor/angiotensin II receptor blocker is being taken. She sees a podiatrist.Eye exam is current.  Hyperlipidemia This is a chronic problem. The current episode started more than 1 year ago. The problem is controlled. Exacerbating  diseases include diabetes and obesity. Pertinent negatives include no chest pain, myalgias or shortness of breath. Current antihyperlipidemic treatment includes statins. Risk factors for coronary artery disease include diabetes mellitus, dyslipidemia, family history, obesity, hypertension and a sedentary lifestyle.  Hypertension This is a chronic problem. The current episode started more than 1 year ago. Pertinent negatives include no blurred vision, chest pain, headaches, palpitations or shortness of breath. Risk factors for coronary artery disease include dyslipidemia, diabetes mellitus, family history, obesity and sedentary lifestyle. Past treatments include angiotensin blockers.    Review of systems  Constitutional: + Minimally fluctuating body weight,  current  Body mass index is 43.7 kg/m. , + fatigue, no subjective hyperthermia, no subjective hypothermia Eyes: no blurry vision, no xerophthalmia ENT: no sore throat, no nodules palpated in throat, no dysphagia/odynophagia, no hoarseness Cardiovascular: no Chest Pain, no Shortness of Breath, no palpitations, no leg swelling Respiratory: no cough,  no shortness of breath Gastrointestinal: no Nausea/Vomiting/Diarhhea Musculoskeletal: no muscle/joint aches Skin: no rashes, no hyperemia Neurological: no tremors, no numbness, no tingling, no dizziness Psychiatric: no depression, no anxiety   Objective:    BP (!) 110/56   Pulse 72   Ht 5\' 5"  (1.651 m)   Wt (!) 262 lb 9.6 oz (119.1 kg)   BMI 43.70 kg/m   Wt Readings from Last 3 Encounters:  01/16/20 (!) 262 lb 9.6 oz (119.1 kg)  10/12/19 259 lb 12.8 oz (117.8 kg)  09/27/19 262 lb (118.8 kg)       CMP     Component Value Date/Time   NA 136 (A) 01/11/2020 0000   K 4.4 01/11/2020 0000   CL 103 01/11/2020 0000   CO2 27 (A) 01/11/2020 0000   GLUCOSE 293 (H) 07/04/2018 1407   BUN 23 (A) 01/11/2020 0000   CREATININE 1.5 (A) 01/11/2020 0000   CREATININE 1.30 (H) 07/04/2018 1407    CALCIUM 8.4 (A) 01/11/2020 0000   ALBUMIN 3.1 (A) 01/11/2020 0000   AST 22 01/11/2020 0000   ALT 25 01/11/2020 0000   ALKPHOS 83 01/11/2020 0000   GFRNONAA 35 01/11/2020 0000   GFRAA 42 01/11/2020 0000   Diabetic Labs (most recent): Lab Results  Component Value Date   HGBA1C 7.8 (A) 01/16/2020   HGBA1C 8.4 (A) 10/12/2019   HGBA1C 7.9 (A) 06/27/2019    Lipid Panel     Component Value Date/Time   CHOL 148 10/09/2019 0000   TRIG 127 10/09/2019 0000   HDL 46 10/09/2019 0000   LDLCALC 77 10/09/2019 0000     Assessment & Plan:   1. 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   Interpretation of her CGM printout show that her average blood glucose is 176, 54% time in range, 45% above range, 1% hypoglycemia.  Her point-of-care A1c is 7.8%, generally improving.    Her diabetes is complicated by  CKD, obesity , and sedentary life and patient remains at a high risk for more acute and chronic complications of diabetes which include CAD, CVA, CKD, retinopathy, and neuropathy. These are all discussed in detail with the patient.  - I have counseled the patient on diet management and weight loss, by adopting a carbohydrate restricted/protein rich diet.   - she  admits there is a room for improvement in her diet and drink choices. -  Suggestion is made for her to avoid simple carbohydrates  from her diet including Cakes, Sweet Desserts / Pastries, Ice Cream, Soda (diet and regular), Sweet Tea, Candies, Chips, Cookies, Sweet Pastries,  Store Bought Juices, Alcohol in Excess of  1-2 drinks a day, Artificial Sweeteners, Coffee Creamer, and "Sugar-free" Products. This will help patient to have stable blood glucose profile and potentially avoid unintended weight gain.   - I encouraged the patient to switch to unprocessed or minimally  processed complex starch and increased protein intake (animal or plant source), fruits, and vegetables.  - Patient is advised to stick to a routine mealtimes to eat 3 meals  a day and avoid unnecessary snacks ( to snack only to correct hypoglycemia).   - I have approached patient with the following individualized plan to manage diabetes and patient agrees:   -  Based on her blood glucose profile and glycemic burden she will continue to need intensive  treatment with basal/bolus insulin in order for her to maintain control of diabetes to target.    -She has a tendency for binge consumption of sweets and sweetened beverages, promises to do better.    -She has some rare, random hypoglycemia in the early hours, advised to continue Basaglar 80 units nightly, continue NovoLog 25 units 3 times a day before meals for pre-meal blood glucose above 90 mg/dL associated with strict monitoring of glucose 4 times a day-before meals and at bedtime.  - She is benefiting from  continued glucose monitoring, is advised to wear it at all times. -Patient is encouraged to call clinic for blood glucose levels less than 70 or above 300 mg /dl.  -She is advised to continue Januvia 50 mg p.o. daily with breakfast.   -Her renal function is not improving, advised to stay away from metformin treatment for now.  She has seen nephrology consult since last visit.    - Patient specific target  A1c;  LDL, HDL, Triglycerides, were discussed in detail.  2) BP/HTN: Her blood pressure is controlled to target.     -She did get her blood pressure medications adjusted by her nephrologist currently on furosemide 40 mg p.o. daily, spironolactone 50 mg p.o. daily, as well as Hyzaar 50/12.5 mg p.o. daily.    3) Lipids/HPL: Her recent lipid panel showed controlled LDL at 67.  She is advised to continue simvastatin 40 mg p.o. nightly.    Side effects and precautions discussed with her.  4) subclinical hypothyroidism: Resolved.  Her  subsequent full set of thyroid function tests are within normal limits.   She will not need intervention at this time.   5) obesity: Her BMI is 43.2-clearly complicating her diabetes care.  She is a candidate for modest weight loss.  Carbs management discussed with her in detail.   6) Chronic Care/Health Maintenance:  -Patient is on ARB and Statin medications and encouraged to continue to follow up with Ophthalmology, Podiatrist at least yearly or according to recommendations, and advised to   stay away from smoking. I have recommended yearly flu vaccine and pneumonia vaccination at least every 5 years; and  sleep for at least 7 hours a day.   Patient cannot exercise optimally.  She was accompanied by her husband to clinic today. Her fatigue is likely multifactorial including sleep apnea.  Patient has a high risk for coronary artery disease.  She may benefit from stress test.  She already has appointment with her cardiologist, encouraged to keep that appointment.  - I advised patient to maintain close follow up with Richarda Blade E for primary care needs.   - Time spent on this patient care encounter:  45 min, of which > 50% was spent in  counseling and the rest reviewing her blood glucose logs , discussing her hypoglycemia and hyperglycemia episodes, reviewing her current and  previous labs / studies  ( including abstraction from other facilities) and medications  doses and developing a  long term treatment plan and documenting her care.   Please refer to Patient Instructions for Blood Glucose Monitoring and Insulin/Medications Dosing Guide"  in media tab for additional information. Please  also refer to " Patient Self Inventory" in the Media  tab for reviewed elements of pertinent patient history.  Tiffany Brock participated in the discussions, expressed understanding, and voiced agreement with the above plans.  All questions were answered to her satisfaction. she is encouraged to contact  clinic should she have any questions  or concerns prior to her return visit.   Follow up plan: - Return in about 4 months (around 05/18/2020) for F/U with Pre-visit Labs, Meter, Logs, A1c here.Marquis Lunch, MD Phone: [phone]  Fax: [phone]  This note was partially dictated with voice recognition software. Similar sounding words can be transcribed inadequately or may not  be corrected upon review.  01/16/2020, 5:47 PM

## 2020-01-16 NOTE — Patient Instructions (Signed)

## 2020-01-21 ENCOUNTER — Other Ambulatory Visit: Payer: Self-pay | Admitting: "Endocrinology

## 2020-01-21 DIAGNOSIS — IMO0002 Reserved for concepts with insufficient information to code with codable children: Secondary | ICD-10-CM

## 2020-03-04 ENCOUNTER — Other Ambulatory Visit: Payer: Self-pay | Admitting: "Endocrinology

## 2020-04-06 ENCOUNTER — Other Ambulatory Visit: Payer: Self-pay | Admitting: "Endocrinology

## 2020-05-20 ENCOUNTER — Ambulatory Visit: Payer: Medicare Other | Admitting: "Endocrinology

## 2020-05-20 LAB — BASIC METABOLIC PANEL
BUN: 22 — AB (ref 4–21)
Creatinine: 1.2 — AB (ref 0.5–1.1)

## 2020-05-20 LAB — LIPID PANEL
Cholesterol: 128 (ref 0–200)
HDL: 40 (ref 35–70)
LDL Cholesterol: 68
Triglycerides: 100 (ref 40–160)

## 2020-05-20 LAB — COMPREHENSIVE METABOLIC PANEL
Calcium: 9.1 (ref 8.7–10.7)
GFR calc Af Amer: 51
GFR calc non Af Amer: 42

## 2020-05-20 LAB — VITAMIN D 25 HYDROXY (VIT D DEFICIENCY, FRACTURES): Vit D, 25-Hydroxy: 27

## 2020-05-21 ENCOUNTER — Other Ambulatory Visit: Payer: Self-pay | Admitting: "Endocrinology

## 2020-05-21 DIAGNOSIS — E1122 Type 2 diabetes mellitus with diabetic chronic kidney disease: Secondary | ICD-10-CM

## 2020-05-21 DIAGNOSIS — IMO0002 Reserved for concepts with insufficient information to code with codable children: Secondary | ICD-10-CM

## 2020-05-23 ENCOUNTER — Ambulatory Visit: Payer: Medicare Other | Admitting: "Endocrinology

## 2020-05-23 ENCOUNTER — Other Ambulatory Visit: Payer: Self-pay

## 2020-05-23 ENCOUNTER — Encounter: Payer: Self-pay | Admitting: "Endocrinology

## 2020-05-23 VITALS — BP 124/60 | HR 76 | Ht 65.0 in | Wt 265.0 lb

## 2020-05-23 DIAGNOSIS — E1122 Type 2 diabetes mellitus with diabetic chronic kidney disease: Secondary | ICD-10-CM

## 2020-05-23 DIAGNOSIS — N183 Chronic kidney disease, stage 3 unspecified: Secondary | ICD-10-CM | POA: Diagnosis not present

## 2020-05-23 DIAGNOSIS — Z794 Long term (current) use of insulin: Secondary | ICD-10-CM | POA: Diagnosis not present

## 2020-05-23 DIAGNOSIS — E782 Mixed hyperlipidemia: Secondary | ICD-10-CM

## 2020-05-23 DIAGNOSIS — IMO0002 Reserved for concepts with insufficient information to code with codable children: Secondary | ICD-10-CM

## 2020-05-23 DIAGNOSIS — E1165 Type 2 diabetes mellitus with hyperglycemia: Secondary | ICD-10-CM | POA: Diagnosis not present

## 2020-05-23 DIAGNOSIS — I10 Essential (primary) hypertension: Secondary | ICD-10-CM

## 2020-05-23 DIAGNOSIS — E038 Other specified hypothyroidism: Secondary | ICD-10-CM

## 2020-05-23 LAB — POCT GLYCOSYLATED HEMOGLOBIN (HGB A1C): HbA1c, POC (controlled diabetic range): 8.2 % — AB (ref 0.0–7.0)

## 2020-05-23 MED ORDER — NOVOLOG FLEXPEN 100 UNIT/ML ~~LOC~~ SOPN
PEN_INJECTOR | SUBCUTANEOUS | 2 refills | Status: DC
Start: 2020-05-23 — End: 2020-12-09

## 2020-05-23 NOTE — Patient Instructions (Signed)

## 2020-05-23 NOTE — Progress Notes (Signed)
05/23/2020  Endocrinology follow-up note   Subjective:    Patient ID: Tiffany Brock, female    DOB: 06-20-1946.  She is here for follow-up  for management of type 2 diabetes, hyperlipidemia, hypertension.  PCP: Garald Braver  Past Medical History:  Diagnosis Date  . Anemia   . Arthritis   . Diabetes mellitus, type II (HCC)   . Dyspnea    on exertion  . Headache   . Hyperlipidemia   . Hypertension   . Iron deficiency anemia with multiple negative GI work-ups question AVMs 06/28/2019  . Pneumonia    h/o  . PONV (postoperative nausea and vomiting)   . Sleep apnea   . Vertigo    Past Surgical History:  Procedure Laterality Date  . ABDOMINAL HYSTERECTOMY    . APPENDECTOMY    . BLEPHAROPLASTY Bilateral 01/2018  . CARPAL TUNNEL RELEASE Left 2008  . COLONOSCOPY     multiple  . ESOPHAGOGASTRODUODENOSCOPY     multiple  . GIVENS CAPSULE STUDY    . HEMORROIDECTOMY    . UMBILICAL HERNIA REPAIR     Social History   Socioeconomic History  . Marital status: Married    Spouse name: Not on file  . Number of children: Not on file  . Years of education: Not on file  . Highest education level: Not on file  Occupational History  . Not on file  Tobacco Use  . Smoking status: Never Smoker  . Smokeless tobacco: Never Used  Vaping Use  . Vaping Use: Never used  Substance and Sexual Activity  . Alcohol use: No  . Drug use: No  . Sexual activity: Not on file  Other Topics Concern  . Not on file  Social History Narrative   Married and retired   Lives in [place], [place]   No hx EtOH/tobacco/drugs   Social Determinants of Health   Financial Resource Strain:   . Difficulty of Paying Living Expenses: Not on file  Food Insecurity:   . Worried About Programme researcher, broadcasting/film/video in the Last Year: Not on file  . Ran Out of Food in the Last Year: Not on file  Transportation Needs:   . Lack of Transportation (Medical): Not on file  . Lack of Transportation (Non-Medical): Not on file   Physical Activity:   . Days of Exercise per Week: Not on file  . Minutes of Exercise per Session: Not on file  Stress:   . Feeling of Stress : Not on file  Social Connections:   . Frequency of Communication with Friends and Family: Not on file  . Frequency of Social Gatherings with Friends and Family: Not on file  . Attends Religious Services: Not on file  . Active Member of Clubs or Organizations: Not on file  . Attends Banker Meetings: Not on file  . Marital Status: Not on file   Outpatient Encounter Medications as of 05/23/2020  Medication Sig  . albuterol (PROVENTIL HFA;VENTOLIN HFA) 108 (90 Base) MCG/ACT inhaler Inhale 2 puffs into the lungs every 6 (six) hours as needed for wheezing or shortness of breath.  . Continuous Blood Gluc Sensor (FREESTYLE LIBRE SENSOR SYSTEM) MISC Use one sensor every 10 days.  Marland Kitchen esomeprazole (NEXIUM) 40 MG capsule Take 40 mg by mouth daily.  . Ferrous Fumarate (HEMOCYTE - 106 MG FE) 324 (106 Fe) MG TABS tablet Take 1 tablet by mouth.  . fluticasone (FLONASE) 50 MCG/ACT nasal spray   . furosemide (LASIX) 40 MG  tablet Take 40 mg by mouth daily.  Marland Kitchen gabapentin (NEURONTIN) 300 MG capsule Take 300 mg by mouth at bedtime. 3 tablets at bedtime  . insulin aspart (NOVOLOG FLEXPEN) 100 UNIT/ML FlexPen INJECT 25-31 UNITS INTO THE SKIN 3 (THREE) TIMES DAILY WITH MEALS.  Marland Kitchen Insulin Glargine (BASAGLAR KWIKPEN) 100 UNIT/ML INJECT 0.8 MLS (80 UNITS TOTAL) INTO THE SKIN AT BEDTIME.  . Insulin Pen Needle (B-D ULTRAFINE III SHORT PEN) 31G X 8 MM MISC 1 each by Other route See admin instructions. Use one pen needle four times daily to inject insulin  . losartan-hydrochlorothiazide (HYZAAR) 50-12.5 MG tablet Take 1 tablet by mouth daily.  . NON FORMULARY CPAP  . PARoxetine (PAXIL) 10 MG tablet Take 10 mg by mouth daily.  . simvastatin (ZOCOR) 40 MG tablet Take 40 mg by mouth daily.  . sitaGLIPtin (JANUVIA) 50 MG tablet Take 1 tablet (50 mg total) by mouth daily.   Marland Kitchen spironolactone (ALDACTONE) 50 MG tablet Take 50 mg by mouth daily.  . Vitamin D, Ergocalciferol, (DRISDOL) 50000 units CAPS capsule Take 50,000 Units by mouth every Sunday.   . [DISCONTINUED] NOVOLOG FLEXPEN 100 UNIT/ML FlexPen INJECT 25-31 UNITS INTO THE SKIN 3 (THREE) TIMES DAILY WITH MEALS.   No facility-administered encounter medications on file as of 05/23/2020.   ALLERGIES: Allergies  Allergen Reactions  . Biaxin [Clarithromycin] Hives   VACCINATION STATUS:  There is no immunization history on file for this patient.  Diabetes She presents for her follow-up diabetic visit. She has type 2 diabetes mellitus. Onset time: She was diagnosed at approximate age of 46 years. Her disease course has been worsening. There are no hypoglycemic associated symptoms. Pertinent negatives for hypoglycemia include no confusion, headaches, pallor or seizures. Associated symptoms include fatigue. Pertinent negatives for diabetes include no blurred vision, no chest pain, no polydipsia, no polyphagia and no polyuria. There are no hypoglycemic complications. Symptoms are worsening. Risk factors for coronary artery disease include diabetes mellitus, dyslipidemia, family history, hypertension, obesity and sedentary lifestyle. Current diabetic treatment includes insulin injections and oral agent (dual therapy). Her weight is fluctuating minimally. She is following a generally unhealthy diet. When asked about meal planning, she reported none. She has had a previous visit with a dietitian. She never participates in exercise. Her home blood glucose trend is fluctuating minimally. Her breakfast blood glucose range is generally 180-200 mg/dl. Her lunch blood glucose range is generally 180-200 mg/dl. Her dinner blood glucose range is generally 180-200 mg/dl. Her bedtime blood glucose range is generally 180-200 mg/dl. Her overall blood glucose range is 180-200 mg/dl. (Interpretation of her CGM printout show that her average  blood glucose of 197, 42% time in range, 55% above range, 3% mild hypoglycemia.  Her point-of-care A1c is 8.2%.   ) An ACE inhibitor/angiotensin II receptor blocker is being taken. She sees a podiatrist.Eye exam is current.  Hyperlipidemia This is a chronic problem. The current episode started more than 1 year ago. The problem is controlled. Exacerbating diseases include diabetes and obesity. Pertinent negatives include no chest pain, myalgias or shortness of breath. Current antihyperlipidemic treatment includes statins. Risk factors for coronary artery disease include diabetes mellitus, dyslipidemia, family history, obesity, hypertension and a sedentary lifestyle.  Hypertension This is a chronic problem. The current episode started more than 1 year ago. Pertinent negatives include no blurred vision, chest pain, headaches, palpitations or shortness of breath. Risk factors for coronary artery disease include dyslipidemia, diabetes mellitus, family history, obesity and sedentary lifestyle. Past treatments include angiotensin  blockers.    Review of systems  Constitutional: + Minimally fluctuating body weight,  current  Body mass index is 44.1 kg/m. , + fatigue, no subjective hyperthermia, no subjective hypothermia Eyes: no blurry vision, no xerophthalmia ENT: no sore throat, no nodules palpated in throat, no dysphagia/odynophagia, no hoarseness Cardiovascular: no Chest Pain, no Shortness of Breath, no palpitations, no leg swelling Respiratory: no cough, no shortness of breath Gastrointestinal: no Nausea/Vomiting/Diarhhea Musculoskeletal: no muscle/joint aches Skin: no rashes, no hyperemia Neurological: no tremors, no numbness, no tingling, no dizziness Psychiatric: no depression, no anxiety   Objective:    BP 124/60   Pulse 76   Ht  (1.651 m)   Wt 265 lb (120.2 kg)   BMI 44.10 kg/m   Wt Readings from Last 3 Encounters:  05/23/20 265 lb (120.2 kg)  01/16/20 (!) 262 lb 9.6 oz (119.1  kg)  10/12/19 259 lb 12.8 oz (117.8 kg)     CMP     Component Value Date/Time   NA 136 (A) 01/11/2020 0000   K 4.4 01/11/2020 0000   CL 103 01/11/2020 0000   CO2 27 (A) 01/11/2020 0000   GLUCOSE 293 (H) 07/04/2018 1407   BUN 22 (A) 05/20/2020 0000   CREATININE 1.2 (A) 05/20/2020 0000   CREATININE 1.30 (H) 07/04/2018 1407   CALCIUM 9.1 05/20/2020 0000   ALBUMIN 3.1 (A) 01/11/2020 0000   AST 22 01/11/2020 0000   ALT 25 01/11/2020 0000   ALKPHOS 83 01/11/2020 0000   GFRNONAA 42 05/20/2020 0000   GFRAA 51 05/20/2020 0000   Diabetic Labs (most recent): Lab Results  Component Value Date   HGBA1C 8.2 (A) 05/23/2020   HGBA1C 7.8 (A) 01/16/2020   HGBA1C 8.4 (A) 10/12/2019    Lipid Panel     Component Value Date/Time   CHOL 128 05/20/2020 0000   TRIG 100 05/20/2020 0000   HDL 40 05/20/2020 0000   LDLCALC 68 05/20/2020 0000     Assessment & Plan:   1. 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  Interpretation of her CGM printout show that her average blood glucose of 197, 42% time in range, 55% above range, 3% mild hypoglycemia.  Her point-of-care A1c is 8.2%.      Her diabetes is complicated by  CKD, obesity , and sedentary life and patient remains at a high risk for more acute and chronic complications of diabetes which include CAD, CVA, CKD, retinopathy, and neuropathy. These are all discussed in detail with the patient.  - I have counseled the patient on diet management and weight loss, by adopting a carbohydrate restricted/protein rich diet.  - she  admits there is a room for improvement in her diet and drink choices. -  Suggestion is made for her to avoid simple carbohydrates  from her diet including Cakes, Sweet Desserts / Pastries, Ice Cream, Soda (diet and regular), Sweet Tea, Candies, Chips, Cookies,  Sweet Pastries,  Store Bought Juices, Alcohol in Excess of  1-2 drinks a day, Artificial Sweeteners, Coffee Creamer, and "Sugar-free" Products. This will help patient to have stable blood glucose profile and potentially avoid unintended weight gain.  - I encouraged the patient to switch to unprocessed or minimally processed complex starch and increased protein intake (animal or plant source), fruits, and vegetables.  - Patient is advised to stick to  a routine mealtimes to eat 3 meals  a day and avoid unnecessary snacks ( to snack only to correct hypoglycemia).   - I have approached patient with the following individualized plan to manage diabetes and patient agrees:   -  Based on her blood glucose profile and glycemic burden she will continue to need intensive treatment with basal/bolus insulin in order for her to maintain control of diabetes to target.    -She has a tendency for binge consumption of sweets and sweetened beverages, promises to do better.    -She has some rare, random hypoglycemia in the early hours, advised to continue Basaglar 80 units nightly, continue NovoLog 25  units 3 times a day before meals for pre-meal blood glucose above 90 mg/dL associated with strict monitoring of glucose 4 times a day-before meals and at bedtime.  - She is benefiting from  continued glucose monitoring, is advised to wear it at all times. -Patient is encouraged to call clinic for blood glucose levels less than 70 or above 300 mg /dl.  -She is advised to continue Januvia 50 mg p.o. daily with breakfast.     -Her renal function is not improving, advised to stay away from metformin treatment for now.  She has seen nephrology consult since last visit.    - Patient specific target  A1c;  LDL, HDL, Triglycerides, were discussed in detail.  2) BP/HTN:  Her blood pressure is controlled to target.   -She did get her blood pressure medications adjusted by her nephrologist currently on furosemide 40 mg p.o.  daily, spironolactone 50 mg p.o. daily, as well as Hyzaar 50/12.5 mg p.o. daily.    3) Lipids/HPL: Her recent lipid panel showed controlled LDL at 67.  She is advised to continue simvastatin 40 mg p.o. nightly.     Side effects and precautions discussed with her.  4) subclinical hypothyroidism: Resolved.  Her subsequent full set of thyroid function tests are within normal limits.   She will not need intervention at this time.   5) obesity: Her BMI is 43.2-clearly complicating her diabetes care.  She is a candidate for modest weight loss.  Carbs management discussed with her in detail.   6) Chronic Care/Health Maintenance:  -Patient is on ARB and Statin medications and encouraged to continue to follow up with Ophthalmology, Podiatrist at least yearly or according to recommendations, and advised to   stay away from smoking. I have recommended yearly flu vaccine and pneumonia vaccination at least every 5 years; and  sleep for at least 7 hours a day.   Patient cannot exercise optimally.  She was accompanied by her husband to clinic today. Her fatigue is likely multifactorial including sleep apnea.  Patient has a high risk for coronary artery disease.  She may benefit from stress test.  She already has appointment with her cardiologist, encouraged to keep that appointment.  - I advised patient to maintain close follow up with Richarda Blade E for primary care needs.  - Time spent on this patient care encounter:  40 min, of which > 50% was spent in  counseling and the rest reviewing her blood glucose logs , discussing her hypoglycemia and hyperglycemia episodes, reviewing her current and  previous labs / studies  ( including abstraction from other facilities) and medications  doses and developing a  long term treatment plan and documenting her care.   Please refer to Patient Instructions for Blood Glucose Monitoring and Insulin/Medications Dosing Guide"  in media tab for  additional information. Please   also refer to " Patient Self Inventory" in the Media  tab for reviewed elements of pertinent patient history.  Altamese DillingJudith G Chamberlin participated in the discussions, expressed understanding, and voiced agreement with the above plans.  All questions were answered to her satisfaction. she is encouraged to contact clinic should she have any questions or concerns prior to her return visit.   Follow up plan: - Return in about 3 months (around 08/21/2020) for Bring Meter and Logs- A1c in Office, ABI in Office [place].  Marquis LunchGebre Saed Hudlow, MD Phone: [phone]  Fax: [phone]  This note was partially dictated with voice recognition software. Similar sounding words can be transcribed inadequately or may not  be corrected upon review.  05/23/2020, 1:34 PM

## 2020-08-21 ENCOUNTER — Encounter: Payer: Self-pay | Admitting: "Endocrinology

## 2020-08-21 ENCOUNTER — Ambulatory Visit: Payer: Medicare Other | Admitting: "Endocrinology

## 2020-08-21 ENCOUNTER — Other Ambulatory Visit: Payer: Self-pay

## 2020-08-21 VITALS — BP 112/60 | HR 72 | Ht 65.0 in | Wt 265.8 lb

## 2020-08-21 DIAGNOSIS — E1165 Type 2 diabetes mellitus with hyperglycemia: Secondary | ICD-10-CM

## 2020-08-21 DIAGNOSIS — E038 Other specified hypothyroidism: Secondary | ICD-10-CM

## 2020-08-21 DIAGNOSIS — E1122 Type 2 diabetes mellitus with diabetic chronic kidney disease: Secondary | ICD-10-CM | POA: Diagnosis not present

## 2020-08-21 DIAGNOSIS — I10 Essential (primary) hypertension: Secondary | ICD-10-CM | POA: Diagnosis not present

## 2020-08-21 DIAGNOSIS — N183 Chronic kidney disease, stage 3 unspecified: Secondary | ICD-10-CM | POA: Diagnosis not present

## 2020-08-21 DIAGNOSIS — E782 Mixed hyperlipidemia: Secondary | ICD-10-CM | POA: Diagnosis not present

## 2020-08-21 DIAGNOSIS — IMO0002 Reserved for concepts with insufficient information to code with codable children: Secondary | ICD-10-CM

## 2020-08-21 DIAGNOSIS — Z794 Long term (current) use of insulin: Secondary | ICD-10-CM | POA: Diagnosis not present

## 2020-08-21 LAB — POCT GLYCOSYLATED HEMOGLOBIN (HGB A1C): HbA1c, POC (controlled diabetic range): 7.9 % — AB (ref 0.0–7.0)

## 2020-08-21 NOTE — Patient Instructions (Signed)
                                     Advice for Weight Management  -For most of us the best way to lose weight is by diet management. Generally speaking, diet management means consuming less calories intentionally which over time brings about progressive weight loss.  This can be achieved more effectively by restricting carbohydrate consumption to the minimum possible.  So, it is critically important to know your numbers: how much calorie you are consuming and how much calorie you need. More importantly, our carbohydrates sources should be unprocessed or minimally processed complex starch food items.   Sometimes, it is important to balance nutrition by increasing protein intake (animal or plant source), fruits, and vegetables.  -Sticking to a routine mealtime to eat 3 meals a day and avoiding unnecessary snacks is shown to have a big role in weight control. Under normal circumstances, the only time we lose real weight is when we are hungry, so allow hunger to take place- hunger means no food between meal times, only water.  It is not advisable to starve.   -It is better to avoid simple carbohydrates including: Cakes, Sweet Desserts, Ice Cream, Soda (diet and regular), Sweet Tea, Candies, Chips, Cookies, Store Bought Juices, Alcohol in Excess of  1-2 drinks a day, Lemonade,  Artificial Sweeteners, Doughnuts, Coffee Creamers, "Sugar-free" Products, etc, etc.  This is not a complete list.....    -Consulting with certified diabetes educators is proven to provide you with the most accurate and current information on diet.  Also, you may be  interested in discussing diet options/exchanges , we can schedule a visit with Tiffany Brock, RDN, CDE for individualized nutrition education.  -Exercise: If you are able: 30 -60 minutes a day ,4 days a week, or 150 minutes a week.  The longer the better.  Combine stretch, strength, and aerobic activities.  If you were told in the  past that you have high risk for cardiovascular diseases, you may seek evaluation by your heart doctor prior to initiating moderate to intense exercise programs.                                  Additional Care Considerations for Diabetes   -Diabetes  is a chronic disease.  The most important care consideration is regular follow-up with your diabetes care provider with the goal being avoiding or delaying its complications and to take advantage of advances in medications and technology.    -Type 2 diabetes is known to coexist with other important comorbidities such as high blood pressure and high cholesterol.  It is critical to control not only the diabetes but also the high blood pressure and high cholesterol to minimize and delay the risk of complications including coronary artery disease, stroke, amputations, blindness, etc.    - Studies showed that people with diabetes will benefit from a class of medications known as ACE inhibitors and statins.  Unless there are specific reasons not to be on these medications, the standard of care is to consider getting one from these groups of medications at an optimal doses.  These medications are generally considered safe and proven to help protect the heart and the kidneys.    - People with diabetes are encouraged to initiate and maintain regular follow-up with eye doctors, foot   doctors, dentists , and if necessary heart and kidney doctors.     - It is highly recommended that people with diabetes quit smoking or stay away from smoking, and get yearly  flu vaccine and pneumonia vaccine at least every 5 years.  One other important lifestyle recommendation is to ensure adequate sleep - at least 6-7 hours of uninterrupted sleep at night.  -Exercise: If you are able: 30 -60 minutes a day, 4 days a week, or 150 minutes a week.  The longer the better.  Combine stretch, strength, and aerobic activities.  If you were told in the past that you have high risk for  cardiovascular diseases, you may seek evaluation by your heart doctor prior to initiating moderate to intense exercise programs.          

## 2020-08-21 NOTE — Progress Notes (Signed)
08/21/2020  Endocrinology follow-up note   Subjective:    Patient ID: Tiffany Brock, female    DOB: 30-Jun-1945.  She is here for follow-up  for management of type 2 diabetes, hyperlipidemia, hypertension.  PCP: Garald Braver  Past Medical History:  Diagnosis Date  . Anemia   . Arthritis   . Diabetes mellitus, type II (HCC)   . Dyspnea    on exertion  . Headache   . Hyperlipidemia   . Hypertension   . Iron deficiency anemia with multiple negative GI work-ups question AVMs 06/28/2019  . Pneumonia    h/o  . PONV (postoperative nausea and vomiting)   . Sleep apnea   . Vertigo    Past Surgical History:  Procedure Laterality Date  . ABDOMINAL HYSTERECTOMY    . APPENDECTOMY    . BLEPHAROPLASTY Bilateral 01/2018  . CARPAL TUNNEL RELEASE Left 2008  . COLONOSCOPY     multiple  . ESOPHAGOGASTRODUODENOSCOPY     multiple  . GIVENS CAPSULE STUDY    . HEMORROIDECTOMY    . UMBILICAL HERNIA REPAIR     Social History   Socioeconomic History  . Marital status: Married    Spouse name: Not on file  . Number of children: Not on file  . Years of education: Not on file  . Highest education level: Not on file  Occupational History  . Not on file  Tobacco Use  . Smoking status: Never Smoker  . Smokeless tobacco: Never Used  Vaping Use  . Vaping Use: Never used  Substance and Sexual Activity  . Alcohol use: No  . Drug use: No  . Sexual activity: Not on file  Other Topics Concern  . Not on file  Social History Narrative   Married and retired   Lives in [place], [place]   No hx EtOH/tobacco/drugs   Social Determinants of Health   Financial Resource Strain: Not on file  Food Insecurity: Not on file  Transportation Needs: Not on file  Physical Activity: Not on file  Stress: Not on file  Social Connections: Not on file   Outpatient Encounter Medications as of 08/21/2020  Medication Sig  . albuterol (PROVENTIL HFA;VENTOLIN HFA) 108 (90 Base) MCG/ACT inhaler Inhale 2 puffs  into the lungs every 6 (six) hours as needed for wheezing or shortness of breath.  . Continuous Blood Gluc Sensor (FREESTYLE LIBRE SENSOR SYSTEM) MISC Use one sensor every 10 days.  Marland Kitchen esomeprazole (NEXIUM) 40 MG capsule Take 40 mg by mouth daily.  . Ferrous Fumarate (HEMOCYTE - 106 MG FE) 324 (106 Fe) MG TABS tablet Take 1 tablet by mouth.  . fluticasone (FLONASE) 50 MCG/ACT nasal spray   . furosemide (LASIX) 40 MG tablet Take 40 mg by mouth daily.  Marland Kitchen gabapentin (NEURONTIN) 300 MG capsule Take 300 mg by mouth at bedtime. 3 tablets at bedtime  . insulin aspart (NOVOLOG FLEXPEN) 100 UNIT/ML FlexPen INJECT 25-31 UNITS INTO THE SKIN 3 (THREE) TIMES DAILY WITH MEALS.  Marland Kitchen Insulin Glargine (BASAGLAR KWIKPEN) 100 UNIT/ML INJECT 0.8 MLS (80 UNITS TOTAL) INTO THE SKIN AT BEDTIME.  . Insulin Pen Needle (B-D ULTRAFINE III SHORT PEN) 31G X 8 MM MISC 1 each by Other route See admin instructions. Use one pen needle four times daily to inject insulin  . losartan-hydrochlorothiazide (HYZAAR) 50-12.5 MG tablet Take 1 tablet by mouth daily.  . NON FORMULARY CPAP  . PARoxetine (PAXIL) 10 MG tablet Take 10 mg by mouth daily.  . simvastatin (ZOCOR) 40  MG tablet Take 40 mg by mouth daily.  . sitaGLIPtin (JANUVIA) 50 MG tablet Take 1 tablet (50 mg total) by mouth daily.  Marland Kitchen spironolactone (ALDACTONE) 50 MG tablet Take 50 mg by mouth daily.  . Vitamin D, Ergocalciferol, (DRISDOL) 50000 units CAPS capsule Take 50,000 Units by mouth every Sunday.    No facility-administered encounter medications on file as of 08/21/2020.   ALLERGIES: Allergies  Allergen Reactions  . Biaxin [Clarithromycin] Hives   VACCINATION STATUS:  There is no immunization history on file for this patient.  Diabetes She presents for her follow-up diabetic visit. She has type 2 diabetes mellitus. Onset time: She was diagnosed at approximate age of 46 years. Her disease course has been improving. There are no hypoglycemic associated symptoms.  Pertinent negatives for hypoglycemia include no confusion, headaches, pallor or seizures. Associated symptoms include fatigue. Pertinent negatives for diabetes include no blurred vision, no chest pain, no polydipsia, no polyphagia and no polyuria. There are no hypoglycemic complications. Symptoms are improving. Risk factors for coronary artery disease include diabetes mellitus, dyslipidemia, family history, hypertension, obesity and sedentary lifestyle. Current diabetic treatment includes insulin injections and oral agent (dual therapy). Her weight is fluctuating minimally. She is following a generally unhealthy diet. When asked about meal planning, she reported none. She has had a previous visit with a dietitian. She never participates in exercise. Her home blood glucose trend is decreasing steadily. Her breakfast blood glucose range is generally 180-200 mg/dl. Her lunch blood glucose range is generally 140-180 mg/dl. Her dinner blood glucose range is generally 140-180 mg/dl. Her bedtime blood glucose range is generally 180-200 mg/dl. Her overall blood glucose range is 180-200 mg/dl. (She presents with her logs and CGM device.  Interpretation of CGM printouts were discussed with her showing 52% time range, 48% above range.  She has no significant hypoglycemia.  Her point-of-care A1c was 7.9% today improving from 8.4% during her last visit.   ) An ACE inhibitor/angiotensin II receptor blocker is being taken. She sees a podiatrist.Eye exam is current.  Hyperlipidemia This is a chronic problem. The current episode started more than 1 year ago. The problem is controlled. Exacerbating diseases include diabetes and obesity. Pertinent negatives include no chest pain, myalgias or shortness of breath. Current antihyperlipidemic treatment includes statins. Risk factors for coronary artery disease include diabetes mellitus, dyslipidemia, family history, obesity, hypertension and a sedentary lifestyle.  Hypertension This  is a chronic problem. The current episode started more than 1 year ago. Pertinent negatives include no blurred vision, chest pain, headaches, palpitations or shortness of breath. Risk factors for coronary artery disease include dyslipidemia, diabetes mellitus, family history, obesity and sedentary lifestyle. Past treatments include angiotensin blockers.    Review of systems  Constitutional: + Minimally fluctuating body weight,  current  Body mass index is 44.23 kg/m. , + fatigue, no subjective hyperthermia, no subjective hypothermia    Objective:    BP 112/60   Pulse 72   Ht 5\' 5"  (1.651 m)   Wt 265 lb 12.8 oz (120.6 kg)   BMI 44.23 kg/m   Wt Readings from Last 3 Encounters:  08/21/20 265 lb 12.8 oz (120.6 kg)  05/23/20 265 lb (120.2 kg)  01/16/20 (!) 262 lb 9.6 oz (119.1 kg)     CMP     Component Value Date/Time   NA 136 (A) 01/11/2020 0000   K 4.4 01/11/2020 0000   CL 103 01/11/2020 0000   CO2 27 (A) 01/11/2020 0000   GLUCOSE 293 (  H) 07/04/2018 1407   BUN 22 (A) 05/20/2020 0000   CREATININE 1.2 (A) 05/20/2020 0000   CREATININE 1.30 (H) 07/04/2018 1407   CALCIUM 9.1 05/20/2020 0000   ALBUMIN 3.1 (A) 01/11/2020 0000   AST 22 01/11/2020 0000   ALT 25 01/11/2020 0000   ALKPHOS 83 01/11/2020 0000   GFRNONAA 42 05/20/2020 0000   GFRAA 51 05/20/2020 0000   Diabetic Labs (most recent): Lab Results  Component Value Date   HGBA1C 7.9 (A) 08/21/2020   HGBA1C 8.2 (A) 05/23/2020   HGBA1C 7.8 (A) 01/16/2020    Lipid Panel     Component Value Date/Time   CHOL 128 05/20/2020 0000   TRIG 100 05/20/2020 0000   HDL 40 05/20/2020 0000   LDLCALC 68 05/20/2020 0000     Assessment & Plan:   1. 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  She presents with her logs and CGM device.  Interpretation of CGM printouts  were discussed with her showing 52% time range, 48% above range.  She has no significant hypoglycemia.  Her point-of-care A1c was 7.9% today improving from 8.4% during her last visit.      Her diabetes is complicated by  CKD, obesity , and sedentary life and patient remains at a high risk for more acute and chronic complications of diabetes which include CAD, CVA, CKD, retinopathy, and neuropathy. These are all discussed in detail with the patient.  - I have counseled the patient on diet management and weight loss, by adopting a carbohydrate restricted/protein rich diet.  - she acknowledges that there is a room for improvement in her food and drink choices. - Suggestion is made for her to avoid simple carbohydrates  from her diet including Cakes, Sweet Desserts, Ice Cream, Soda (diet and regular), Sweet Tea, Candies, Chips, Cookies, Store Bought Juices, Alcohol in Excess of  1-2 drinks a day, Artificial Sweeteners,  Coffee Creamer, and "Sugar-free" Products, Lemonade. This will help patient to have more stable blood glucose profile and potentially avoid unintended weight gain.  - I encouraged the patient to switch to unprocessed or minimally processed complex starch and increased protein intake (animal or plant source), fruits, and vegetables.  - Patient is advised to stick to a routine mealtimes to eat 3 meals  a day and avoid unnecessary snacks ( to snack only to correct hypoglycemia).   - I have approached patient with the following individualized plan to manage diabetes and patient agrees:   -  Based on her blood glucose profile and glycemic burden she will continue to need intensive treatment with basal/bolus insulin in order for her to maintain control of diabetes to target.  -She has a tendency for binge consumption of sweets and sweetened beverages, promises to do better.  She also has problem with sleep, sleeps in the morning, denies breakfast that leads to a rare, random hypoglycemia in  the early hours.  -She is advised to continue Basaglar 80 units nightly, continue NovoLog 25  units 3 times a day before meals for pre-meal blood glucose above 90 mg/dL associated with strict monitoring of glucose 4 times a day-before meals and at bedtime.  - She is benefiting from  continued glucose monitoring, is advised to wear it at all times. -Patient is encouraged to call clinic for blood glucose levels less than 70 or above  300 mg /dl.  -She is advised to continue Januvia 50 mg p.o. daily with breakfast.   -Her renal function is still abnormal, however stable.  She is advised to continue follow-up with nephrology.  She is off of Metformin for now.   - Patient specific target  A1c;  LDL, HDL, Triglycerides, were discussed in detail.  2) BP/HTN:  -Her blood pressure is controlled to target.   -She did get her blood pressure medications adjusted by her nephrologist currently on furosemide 40 mg p.o. daily, spironolactone 50 mg p.o. daily, as well as Hyzaar 50/12.5 mg p.o. daily.    3) Lipids/HPL: Her recent lipid panel showed controlled LDL at 68.    She is advised to continue simvastatin 40 mg p.o. nightly.     Side effects and precautions discussed with her.  4) subclinical hypothyroidism: Resolved.  Her subsequent full set of thyroid function tests are within normal limits.   She will not need intervention at this time.  Will need repeat thyroid function test along with her next labs.  5) obesity: Her BMI is 44.2 - clearly complicating her diabetes care.  She is a candidate for modest weight loss.  Carbs management discussed with her in detail.   6) Chronic Care/Health Maintenance:  -Patient is on ARB and Statin medications and encouraged to continue to follow up with Ophthalmology, Podiatrist at least yearly or according to recommendations, and advised to   stay away from smoking. I have recommended yearly flu vaccine and pneumonia vaccination at least every 5 years; and  sleep for  at least 7 hours a day.   Patient cannot exercise optimally.  She was accompanied by her husband to clinic today. Her fatigue is likely multifactorial including sleep apnea.  Patient has a high risk for coronary artery disease.  She may benefit from stress test.  She already has appointment with her cardiologist, encouraged to keep that appointment.  - I advised patient to maintain close follow up with Richarda Blade E for primary care needs.  - Time spent on this patient care encounter:  40 min, of which > 50% was spent in  counseling and the rest reviewing her blood glucose logs , discussing her hypoglycemia and hyperglycemia episodes, reviewing her current and  previous labs / studies  ( including abstraction from other facilities) and medications  doses and developing a  long term treatment plan and documenting her care.   Please refer to Patient Instructions for Blood Glucose Monitoring and Insulin/Medications Dosing Guide"  in media tab for additional information. Please  also refer to " Patient Self Inventory" in the Media  tab for reviewed elements of pertinent patient history.  Tiffany Brock participated in the discussions, expressed understanding, and voiced agreement with the above plans.  All questions were answered to her satisfaction. she is encouraged to contact clinic should she have any questions or concerns prior to her return visit.   Follow up plan: - Return in about 3 months (around 11/21/2020) for F/U with Pre-visit Labs, Meter, Logs, A1c here.Marquis Lunch, MD Phone: [phone]  Fax: [phone]  This note was partially dictated with voice recognition software. Similar sounding words can be transcribed inadequately or may not  be corrected upon review.  08/21/2020, 12:33 PM

## 2020-09-22 ENCOUNTER — Other Ambulatory Visit: Payer: Self-pay | Admitting: "Endocrinology

## 2020-11-15 LAB — COMPREHENSIVE METABOLIC PANEL
Albumin: 3 — AB (ref 3.5–5.0)
Calcium: 8.3 — AB (ref 8.7–10.7)
GFR calc Af Amer: 51
GFR calc non Af Amer: 42

## 2020-11-15 LAB — BASIC METABOLIC PANEL
BUN: 21 (ref 4–21)
CO2: 28 — AB (ref 13–22)
Chloride: 108 (ref 99–108)
Creatinine: 1.2 — AB (ref 0.5–1.1)
Glucose: 115
Potassium: 4.4 (ref 3.4–5.3)
Sodium: 141 (ref 137–147)

## 2020-11-15 LAB — TSH: TSH: 1.14 (ref 0.41–5.90)

## 2020-11-15 LAB — HEPATIC FUNCTION PANEL
ALT: 19 (ref 7–35)
AST: 19 (ref 13–35)
Alkaline Phosphatase: 70 (ref 25–125)
Bilirubin, Total: 0.8

## 2020-11-22 ENCOUNTER — Other Ambulatory Visit: Payer: Self-pay

## 2020-11-22 ENCOUNTER — Ambulatory Visit (INDEPENDENT_AMBULATORY_CARE_PROVIDER_SITE_OTHER): Payer: Medicare Other | Admitting: "Endocrinology

## 2020-11-22 ENCOUNTER — Encounter: Payer: Self-pay | Admitting: "Endocrinology

## 2020-11-22 VITALS — BP 138/78 | HR 68 | Ht 65.0 in | Wt 266.2 lb

## 2020-11-22 DIAGNOSIS — E1165 Type 2 diabetes mellitus with hyperglycemia: Secondary | ICD-10-CM | POA: Diagnosis not present

## 2020-11-22 DIAGNOSIS — E1122 Type 2 diabetes mellitus with diabetic chronic kidney disease: Secondary | ICD-10-CM | POA: Diagnosis not present

## 2020-11-22 DIAGNOSIS — I10 Essential (primary) hypertension: Secondary | ICD-10-CM

## 2020-11-22 DIAGNOSIS — Z794 Long term (current) use of insulin: Secondary | ICD-10-CM | POA: Diagnosis not present

## 2020-11-22 DIAGNOSIS — E559 Vitamin D deficiency, unspecified: Secondary | ICD-10-CM

## 2020-11-22 DIAGNOSIS — E782 Mixed hyperlipidemia: Secondary | ICD-10-CM | POA: Diagnosis not present

## 2020-11-22 DIAGNOSIS — N183 Chronic kidney disease, stage 3 unspecified: Secondary | ICD-10-CM | POA: Diagnosis not present

## 2020-11-22 DIAGNOSIS — IMO0002 Reserved for concepts with insufficient information to code with codable children: Secondary | ICD-10-CM

## 2020-11-22 LAB — POCT GLYCOSYLATED HEMOGLOBIN (HGB A1C): HbA1c, POC (controlled diabetic range): 8.3 % — AB (ref 0.0–7.0)

## 2020-11-22 NOTE — Progress Notes (Signed)
11/22/2020  Endocrinology follow-up note   Subjective:    Patient ID: Tiffany Brock, female    DOB: 02-13-1946.  She is here for follow-up  for management of type 2 diabetes, hyperlipidemia, hypertension.  PCP: Garald BraverElliott, Dianne E  Past Medical History:  Diagnosis Date  . Anemia   . Arthritis   . Diabetes mellitus, type II (HCC)   . Dyspnea    on exertion  . Headache   . Hyperlipidemia   . Hypertension   . Iron deficiency anemia with multiple negative GI work-ups question AVMs 06/28/2019  . Pneumonia    h/o  . PONV (postoperative nausea and vomiting)   . Sleep apnea   . Vertigo    Past Surgical History:  Procedure Laterality Date  . ABDOMINAL HYSTERECTOMY    . APPENDECTOMY    . BLEPHAROPLASTY Bilateral 01/2018  . CARPAL TUNNEL RELEASE Left 2008  . COLONOSCOPY     multiple  . ESOPHAGOGASTRODUODENOSCOPY     multiple  . GIVENS CAPSULE STUDY    . HEMORROIDECTOMY    . UMBILICAL HERNIA REPAIR     Social History   Socioeconomic History  . Marital status: Married    Spouse name: Not on file  . Number of children: Not on file  . Years of education: Not on file  . Highest education level: Not on file  Occupational History  . Not on file  Tobacco Use  . Smoking status: Never Smoker  . Smokeless tobacco: Never Used  Vaping Use  . Vaping Use: Never used  Substance and Sexual Activity  . Alcohol use: No  . Drug use: No  . Sexual activity: Not on file  Other Topics Concern  . Not on file  Social History Narrative   Married and retired   [place] in [place], [place]   No hx EtOH/tobacco/drugs   Social Determinants of Health   Financial Resource Strain: Not on file  Food Insecurity: Not on file  Transportation Needs: Not on file  Physical Activity: Not on file  Stress: Not on file  Social Connections: Not on file   Outpatient Encounter Medications as of 11/22/2020  Medication Sig  . albuterol (PROVENTIL HFA;VENTOLIN HFA) 108 (90 Base) MCG/ACT inhaler Inhale 2 puffs  into the lungs every 6 (six) hours as needed for wheezing or shortness of breath.  . B-D ULTRAFINE III SHORT PEN 31G X 8 MM MISC 1 EACH BY OTHER ROUTE SEE ADMIN INSTRUCTIONS. USE ONE PEN NEEDLE FOUR TIMES DAILY TO INJECT INSULIN  . Continuous Blood Gluc Sensor (FREESTYLE LIBRE SENSOR SYSTEM) MISC Use one sensor every 10 days.  Marland Kitchen. esomeprazole (NEXIUM) 40 MG capsule Take 40 mg by mouth daily.  . Ferrous Fumarate (HEMOCYTE - 106 MG FE) 324 (106 Fe) MG TABS tablet Take 1 tablet by mouth.  . fluticasone (FLONASE) 50 MCG/ACT nasal spray   . furosemide (LASIX) 40 MG tablet Take 40 mg by mouth daily.  Marland Kitchen. gabapentin (NEURONTIN) 300 MG capsule Take 300 mg by mouth at bedtime. 3 tablets at bedtime  . insulin aspart (NOVOLOG FLEXPEN) 100 UNIT/ML FlexPen INJECT 25-31 UNITS INTO THE SKIN 3 (THREE) TIMES DAILY WITH MEALS.  Marland Kitchen. Insulin Glargine (BASAGLAR KWIKPEN) 100 UNIT/ML INJECT 0.8 MLS (80 UNITS TOTAL) INTO THE SKIN AT BEDTIME.  Marland Kitchen. losartan-hydrochlorothiazide (HYZAAR) 50-12.5 MG tablet Take 1 tablet by mouth daily.  . NON FORMULARY CPAP  . PARoxetine (PAXIL) 10 MG tablet Take 10 mg by mouth daily.  . simvastatin (ZOCOR) 40 MG tablet Take  40 mg by mouth daily.  . sitaGLIPtin (JANUVIA) 50 MG tablet Take 1 tablet (50 mg total) by mouth daily.  Marland Kitchen spironolactone (ALDACTONE) 50 MG tablet Take 50 mg by mouth daily.  . Vitamin D, Ergocalciferol, (DRISDOL) 50000 units CAPS capsule Take 50,000 Units by mouth every Sunday.    No facility-administered encounter medications on file as of 11/22/2020.   ALLERGIES: Allergies  Allergen Reactions  . Biaxin [Clarithromycin] Hives   VACCINATION STATUS:  There is no immunization history on file for this patient.  Diabetes She presents for her follow-up diabetic visit. She has type 2 diabetes mellitus. Onset time: She was diagnosed at approximate age of 46 years. Her disease course has been worsening. There are no hypoglycemic associated symptoms. Pertinent negatives for  hypoglycemia include no confusion, headaches, pallor or seizures. Associated symptoms include fatigue. Pertinent negatives for diabetes include no blurred vision, no chest pain, no polydipsia, no polyphagia and no polyuria. There are no hypoglycemic complications. Symptoms are worsening. Risk factors for coronary artery disease include diabetes mellitus, dyslipidemia, family history, hypertension, obesity and sedentary lifestyle. Current diabetic treatment includes insulin injections and oral agent (dual therapy). Her weight is fluctuating minimally. She is following a generally unhealthy diet. When asked about meal planning, she reported none. She has had a previous visit with a dietitian. She never participates in exercise. Her home blood glucose trend is decreasing steadily. Her breakfast blood glucose range is generally 180-200 mg/dl. Her lunch blood glucose range is generally 180-200 mg/dl. Her dinner blood glucose range is generally 180-200 mg/dl. Her bedtime blood glucose range is generally 180-200 mg/dl. Her overall blood glucose range is 180-200 mg/dl. (She presents without her  CGM device.  Her logs are showing significant fluctuation in her glycemic profile including hypoglycemia in the 200s, no hypoglycemia.  Her point-of-care A1c is 8.3% increasing  from her last visit A1c of 7.9%.  ) An ACE inhibitor/angiotensin II receptor blocker is being taken. She sees a podiatrist.Eye exam is current.  Hyperlipidemia This is a chronic problem. The current episode started more than 1 year ago. The problem is controlled. Exacerbating diseases include diabetes and obesity. Pertinent negatives include no chest pain, myalgias or shortness of breath. Current antihyperlipidemic treatment includes statins. Risk factors for coronary artery disease include diabetes mellitus, dyslipidemia, family history, obesity, hypertension and a sedentary lifestyle.  Hypertension This is a chronic problem. The current episode  started more than 1 year ago. Pertinent negatives include no blurred vision, chest pain, headaches, palpitations or shortness of breath. Risk factors for coronary artery disease include dyslipidemia, diabetes mellitus, family history, obesity and sedentary lifestyle. Past treatments include angiotensin blockers.    Review of systems  Constitutional: + Minimally fluctuating body weight,  current  Body mass index is 44.3 kg/m. , + fatigue, no subjective hyperthermia, no subjective hypothermia    Objective:    BP 138/78   Pulse 68   Ht 5\' 5"  (1.651 m)   Wt 266 lb 3.2 oz (120.7 kg)   BMI 44.30 kg/m   Wt Readings from Last 3 Encounters:  11/22/20 266 lb 3.2 oz (120.7 kg)  08/21/20 265 lb 12.8 oz (120.6 kg)  05/23/20 265 lb (120.2 kg)     CMP     Component Value Date/Time   NA 141 11/15/2020 0000   K 4.4 11/15/2020 0000   CL 108 11/15/2020 0000   CO2 28 (A) 11/15/2020 0000   GLUCOSE 293 (H) 07/04/2018 1407   BUN 21 11/15/2020 0000  CREATININE 1.2 (A) 11/15/2020 0000   CREATININE 1.30 (H) 07/04/2018 1407   CALCIUM 8.3 (A) 11/15/2020 0000   ALBUMIN 3.0 (A) 11/15/2020 0000   AST 19 11/15/2020 0000   ALT 19 11/15/2020 0000   ALKPHOS 70 11/15/2020 0000   GFRNONAA 42 11/15/2020 0000   GFRAA 51 11/15/2020 0000   Diabetic Labs (most recent): Lab Results  Component Value Date   HGBA1C 8.3 (A) 11/22/2020   HGBA1C 7.9 (A) 08/21/2020   HGBA1C 8.2 (A) 05/23/2020    Lipid Panel     Component Value Date/Time   CHOL 128 05/20/2020 0000   TRIG 100 05/20/2020 0000   HDL 40 05/20/2020 0000   LDLCALC 68 05/20/2020 0000     Assessment & Plan:   1. 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  She presents without her  CGM device.  Her logs are showing significant fluctuation in her glycemic profile including  hypoglycemia in the 200s, no hypoglycemia.  Her point-of-care A1c is 8.3% increasing  from her last visit A1c of 7.9%.     Her diabetes is complicated by  CKD, obesity , and sedentary life and patient remains at a high risk for more acute and chronic complications of diabetes which include CAD, CVA, CKD, retinopathy, and neuropathy. These are all discussed in detail with the patient.  - I have counseled the patient on diet management and weight loss, by adopting a carbohydrate restricted/protein rich diet.  - she acknowledges that there is a room for improvement in her food and drink choices. - Suggestion is made for her to avoid simple carbohydrates  from her diet including Cakes, Sweet Desserts, Ice Cream, Soda (diet and regular), Sweet Tea, Candies, Chips, Cookies, Store Bought Juices, Alcohol in Excess of  1-2 drinks a day, Artificial Sweeteners,  Coffee Creamer, and "Sugar-free" Products, Lemonade. This will help patient to have more stable blood glucose profile and potentially avoid unintended weight gain.   - I encouraged the patient to switch to unprocessed or minimally processed complex starch and increased protein intake (animal or plant source), fruits, and vegetables.  - Patient is advised to stick to a routine mealtimes to eat 3 meals  a day and avoid unnecessary snacks ( to snack only to correct hypoglycemia).   - I have approached patient with the following individualized plan to manage diabetes and patient agrees:   -She has been lowering her Basaglar to 70 units by herself.  In light of her presentation with significant above target glycemic profile at fasting and postprandial, she is advised that she will need a higher dose of Basaglar to control her diabetes to target  -She has a tendency for binge consumption of sweets and sweetened beverages, promises to do better.  She also has problem with sleep, sleeps in the morning, denies breakfast that leads to a rare, random  hypoglycemia in the early hours.  -She is advised to increase Basaglar to 80 units nightly, continue NovoLog 25  units 3 times a day before meals for pre-meal blood glucose above 90 mg/dL associated with strict monitoring of glucose 4 times a day-before meals and at bedtime.  - She is benefiting from  continued glucose monitoring, is advised to wear it at all times. -Patient is encouraged to call clinic for blood glucose levels less than 70 or above 300  mg /dl. -She is advised to use her CGM at all times.  -She is advised to continue Januvia 50 mg p.o. daily with breakfast.   -Her renal function is still abnormal, however stable.  She is advised to continue follow-up with nephrology.  She is off of Metformin for now.   - Patient specific target  A1c;  LDL, HDL, Triglycerides, were discussed in detail.  2) BP/HTN:  -Her blood pressure is controlled to target.   -She did get her blood pressure medications adjusted by her nephrologist currently on furosemide 40 mg p.o. daily, spironolactone 50 mg p.o. daily, as well as Hyzaar 50/12.5 mg p.o. daily.    3) Lipids/HPL: Her recent lipid panel showed controlled LDL at 68.    She is advised to continue simvastatin 40 mg p.o. nightly.   Side effects and precautions discussed with her.  4) subclinical hypothyroidism: Resolved.  Her subsequent full set of thyroid function tests are within normal limits.   She will not need intervention at this time.  She will need repeat thyroid function test after her next visit.    5) obesity: Her BMI is 44.3- - clearly complicating her diabetes care.  She is a candidate for modest weight loss.  Carbs management discussed with her in detail. -Plant predominant Whole Foods lifestyle nutrition is discussed and recommended to her.   6) Chronic Care/Health Maintenance:  -Patient is on ARB and Statin medications and encouraged to continue to follow up with Ophthalmology, Podiatrist at least yearly or according to  recommendations, and advised to   stay away from smoking. I have recommended yearly flu vaccine and pneumonia vaccination at least every 5 years; and  sleep for at least 7 hours a day.   Patient cannot exercise optimally.  She was accompanied by her husband to clinic today. Her fatigue is likely multifactorial including sleep apnea.  Patient has a high risk for coronary artery disease.  She may benefit from stress test.  She already has appointment with her cardiologist, encouraged to keep that appointment.  - I advised patient to maintain close follow up with Richarda Blade E for primary care needs.   I spent 40 minutes in the care of the patient today including review of labs from CMP, Lipids, Thyroid Function, Hematology (current and previous including abstractions from other facilities); face-to-face time discussing  her blood glucose readings/logs, discussing hypoglycemia and hyperglycemia episodes and symptoms, medications doses, her options of short and long term treatment based on the latest standards of care / guidelines;  discussion about incorporating lifestyle medicine;  and documenting the encounter.    Please refer to Patient Instructions for Blood Glucose Monitoring and Insulin/Medications Dosing Guide"  in media tab for additional information. Please  also refer to " Patient Self Inventory" in the Media  tab for reviewed elements of pertinent patient history.  Lucile Hillmann Scholle participated in the discussions, expressed understanding, and voiced agreement with the above plans.  All questions were answered to her satisfaction. she is encouraged to contact clinic should she have any questions or concerns prior to her return visit.   Follow up plan: - Return in about 3 months (around 02/22/2021) for Bring Meter and Logs- A1c in Office, ABI in Office [place].  Marquis Lunch, MD Phone: [phone]  Fax: [phone]  This note was partially dictated with voice recognition software. Similar  sounding words can be transcribed inadequately or may not  be corrected upon review.  11/22/2020, 10:37 AM

## 2020-11-22 NOTE — Patient Instructions (Signed)

## 2020-12-08 ENCOUNTER — Other Ambulatory Visit: Payer: Self-pay | Admitting: "Endocrinology

## 2021-01-19 ENCOUNTER — Other Ambulatory Visit: Payer: Self-pay | Admitting: "Endocrinology

## 2021-02-27 ENCOUNTER — Ambulatory Visit: Payer: Medicare Other | Admitting: "Endocrinology

## 2021-02-27 ENCOUNTER — Encounter: Payer: Self-pay | Admitting: "Endocrinology

## 2021-02-27 ENCOUNTER — Other Ambulatory Visit: Payer: Self-pay

## 2021-02-27 VITALS — BP 118/66 | HR 76 | Ht 65.0 in | Wt 261.0 lb

## 2021-02-27 DIAGNOSIS — E1122 Type 2 diabetes mellitus with diabetic chronic kidney disease: Secondary | ICD-10-CM

## 2021-02-27 DIAGNOSIS — Z794 Long term (current) use of insulin: Secondary | ICD-10-CM | POA: Diagnosis not present

## 2021-02-27 DIAGNOSIS — E782 Mixed hyperlipidemia: Secondary | ICD-10-CM

## 2021-02-27 DIAGNOSIS — N183 Chronic kidney disease, stage 3 unspecified: Secondary | ICD-10-CM

## 2021-02-27 DIAGNOSIS — E1165 Type 2 diabetes mellitus with hyperglycemia: Secondary | ICD-10-CM

## 2021-02-27 DIAGNOSIS — I10 Essential (primary) hypertension: Secondary | ICD-10-CM | POA: Diagnosis not present

## 2021-02-27 DIAGNOSIS — E559 Vitamin D deficiency, unspecified: Secondary | ICD-10-CM

## 2021-02-27 DIAGNOSIS — E038 Other specified hypothyroidism: Secondary | ICD-10-CM | POA: Diagnosis not present

## 2021-02-27 DIAGNOSIS — IMO0002 Reserved for concepts with insufficient information to code with codable children: Secondary | ICD-10-CM

## 2021-02-27 NOTE — Progress Notes (Signed)
02/27/2021  Endocrinology follow-up note   Subjective:    Patient ID: Tiffany Brock, female    DOB: February 11, 1946.  She is here for follow-up  for management of type 2 diabetes, hyperlipidemia, hypertension.  PCP: Richarda BladeElliott, Dianne E  Past Medical History:  Diagnosis Date   Anemia    Arthritis    Diabetes mellitus, type II (HCC)    Dyspnea    on exertion   Headache    Hyperlipidemia    Hypertension    Iron deficiency anemia with multiple negative GI work-ups question AVMs 06/28/2019   Pneumonia    h/o   PONV (postoperative nausea and vomiting)    Sleep apnea    Vertigo    Past Surgical History:  Procedure Laterality Date   ABDOMINAL HYSTERECTOMY     APPENDECTOMY     BLEPHAROPLASTY Bilateral 01/2018   CARPAL TUNNEL RELEASE Left 2008   COLONOSCOPY     multiple   ESOPHAGOGASTRODUODENOSCOPY     multiple   GIVENS CAPSULE STUDY     HEMORROIDECTOMY     UMBILICAL HERNIA REPAIR     Social History   Socioeconomic History   Marital status: Married    Spouse name: Not on file   Number of children: Not on file   Years of education: Not on file   Highest education level: Not on file  Occupational History   Not on file  Tobacco Use   Smoking status: Never   Smokeless tobacco: Never  Vaping Use   Vaping Use: Never used  Substance and Sexual Activity   Alcohol use: No   Drug use: No   Sexual activity: Not on file  Other Topics Concern   Not on file  Social History Narrative   Married and retired   [place]   No hx EtOH/tobacco/drugs   Social Determinants of Health   Financial Resource Strain: Not on file  Food Insecurity: Not on file  Transportation Needs: Not on file  Physical Activity: Not on file  Stress: Not on file  Social Connections: Not on file   Outpatient Encounter Medications as of 02/27/2021  Medication Sig   albuterol (PROVENTIL HFA;VENTOLIN HFA) 108 (90 Base) MCG/ACT inhaler Inhale 2 puffs into the lungs every 6 (six) hours as needed  for wheezing or shortness of breath.   B-D ULTRAFINE III SHORT PEN 31G X 8 MM MISC 1 EACH BY OTHER ROUTE SEE ADMIN INSTRUCTIONS. USE ONE PEN NEEDLE FOUR TIMES DAILY TO INJECT INSULIN   Continuous Blood Gluc Sensor (FREESTYLE LIBRE SENSOR SYSTEM) MISC Use one sensor every 10 days.   esomeprazole (NEXIUM) 40 MG capsule Take 40 mg by mouth daily.   Ferrous Fumarate (HEMOCYTE - 106 MG FE) 324 (106 Fe) MG TABS tablet Take 1 tablet by mouth.   fluticasone (FLONASE) 50 MCG/ACT nasal spray    furosemide (LASIX) 40 MG tablet Take 20 mg by mouth 2 (two) times daily.   gabapentin (NEURONTIN) 300 MG capsule Take 300 mg by mouth at bedtime. 2 tablets at bedtime   Insulin Glargine (BASAGLAR KWIKPEN) 100 UNIT/ML INJECT 0.8 MLS (80 UNITS TOTAL) INTO THE SKIN AT BEDTIME.   losartan-hydrochlorothiazide (HYZAAR) 50-12.5 MG tablet Take 1 tablet by mouth daily.   NON FORMULARY CPAP   NOVOLOG FLEXPEN 100 UNIT/ML FlexPen INJECT 25-31 UNITS INTO THE SKIN 3 (THREE) TIMES DAILY WITH MEALS.   PARoxetine (PAXIL) 10 MG tablet Take 10 mg by mouth daily.   simvastatin (ZOCOR) 40 MG tablet Take 40  mg by mouth daily.   sitaGLIPtin (JANUVIA) 50 MG tablet Take 1 tablet (50 mg total) by mouth daily.   spironolactone (ALDACTONE) 50 MG tablet Take 50 mg by mouth daily. (Patient not taking: Reported on 02/27/2021)   Vitamin D, Ergocalciferol, (DRISDOL) 50000 units CAPS capsule Take 50,000 Units by mouth every 14 (fourteen) days.   No facility-administered encounter medications on file as of 02/27/2021.   ALLERGIES: Allergies  Allergen Reactions   Biaxin [Clarithromycin] Hives   VACCINATION STATUS:  There is no immunization history on file for this patient.  Diabetes She presents for her follow-up diabetic visit. She has type 2 diabetes mellitus. Onset time: She was diagnosed at approximate age of 46 years. Her disease course has been improving. There are no hypoglycemic associated symptoms. Pertinent negatives for hypoglycemia  include no confusion, headaches, pallor or seizures. Associated symptoms include fatigue. Pertinent negatives for diabetes include no blurred vision, no chest pain, no polydipsia, no polyphagia and no polyuria. There are no hypoglycemic complications. Symptoms are improving. Risk factors for coronary artery disease include diabetes mellitus, dyslipidemia, family history, hypertension, obesity and sedentary lifestyle. Current diabetic treatment includes insulin injections and oral agent (dual therapy). Her weight is fluctuating minimally. She is following a generally unhealthy diet. When asked about meal planning, she reported none. She has had a previous visit with a dietitian. She never participates in exercise. Her home blood glucose trend is fluctuating minimally. Her breakfast blood glucose range is generally 140-180 mg/dl. Her lunch blood glucose range is generally 180-200 mg/dl. Her dinner blood glucose range is generally 180-200 mg/dl. Her bedtime blood glucose range is generally 180-200 mg/dl. Her overall blood glucose range is 180-200 mg/dl. (She presents with her CGM device.  Analysis shows 39% time range, 57% above range.  She has rare, random, fasting hypoglycemia due to late breakfast.  Her point-of-care A1c is 8.2% unchanged from her last visit A1c.    ) An ACE inhibitor/angiotensin II receptor blocker is being taken. She sees a podiatrist.Eye exam is current.  Hyperlipidemia This is a chronic problem. The current episode started more than 1 year ago. The problem is controlled. Exacerbating diseases include diabetes and obesity. Pertinent negatives include no chest pain, myalgias or shortness of breath. Current antihyperlipidemic treatment includes statins. Risk factors for coronary artery disease include diabetes mellitus, dyslipidemia, family history, obesity, hypertension and a sedentary lifestyle.  Hypertension This is a chronic problem. The current episode started more than 1 year ago.  Pertinent negatives include no blurred vision, chest pain, headaches, palpitations or shortness of breath. Risk factors for coronary artery disease include dyslipidemia, diabetes mellitus, family history, obesity and sedentary lifestyle. Past treatments include angiotensin blockers.   Review of systems  Constitutional: + Minimally fluctuating body weight,  current  Body mass index is 43.43 kg/m. , + fatigue, no subjective hyperthermia, no subjective hypothermia    Objective:    BP 118/66   Pulse 76   Ht 5\' 5"  (1.651 m)   Wt 261 lb (118.4 kg)   BMI 43.43 kg/m   Wt Readings from Last 3 Encounters:  02/27/21 261 lb (118.4 kg)  11/22/20 266 lb 3.2 oz (120.7 kg)  08/21/20 265 lb 12.8 oz (120.6 kg)     CMP     Component Value Date/Time   NA 141 11/15/2020 0000   K 4.4 11/15/2020 0000   CL 108 11/15/2020 0000   CO2 28 (A) 11/15/2020 0000   GLUCOSE 293 (H) 07/04/2018 1407   BUN 21  11/15/2020 0000   CREATININE 1.2 (A) 11/15/2020 0000   CREATININE 1.30 (H) 07/04/2018 1407   CALCIUM 8.3 (A) 11/15/2020 0000   ALBUMIN 3.0 (A) 11/15/2020 0000   AST 19 11/15/2020 0000   ALT 19 11/15/2020 0000   ALKPHOS 70 11/15/2020 0000   GFRNONAA 42 11/15/2020 0000   GFRAA 51 11/15/2020 0000   Diabetic Labs (most recent): Lab Results  Component Value Date   HGBA1C 8.3 (A) 11/22/2020   HGBA1C 7.9 (A) 08/21/2020   HGBA1C 8.2 (A) 05/23/2020    Lipid Panel     Component Value Date/Time   CHOL 128 05/20/2020 0000   TRIG 100 05/20/2020 0000   HDL 40 05/20/2020 0000   LDLCALC 68 05/20/2020 0000     Assessment & Plan:   1. 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  She presents with her CGM device.  Analysis shows 39% time range, 57% above range.  She has rare, random, fasting hypoglycemia due to late breakfast.  Her  point-of-care A1c is 8.2% unchanged from her last visit A1c.      Her diabetes is complicated by  CKD, obesity , and sedentary life and patient remains at a high risk for more acute and chronic complications of diabetes which include CAD, CVA, CKD, retinopathy, and neuropathy. These are all discussed in detail with the patient.  - I have counseled the patient on diet management and weight loss, by adopting a carbohydrate restricted/protein rich diet.  - she acknowledges that there is a room for improvement in her food and drink choices. - Suggestion is made for her to avoid simple carbohydrates  from her diet including Cakes, Sweet Desserts, Ice Cream, Soda (diet and regular), Sweet Tea, Candies, Chips, Cookies, Store Bought Juices, Alcohol in Excess of  1-2 drinks a day, Artificial Sweeteners,  Coffee Creamer, and "Sugar-free" Products, Lemonade. This will help patient to have more stable blood glucose profile and potentially avoid unintended weight gain.   - I encouraged the patient to switch to unprocessed or minimally processed complex starch and increased protein intake (animal or plant source), fruits, and vegetables.  - Patient is advised to stick to a routine mealtimes to eat 3 meals  a day and avoid unnecessary snacks ( to snack only to correct hypoglycemia).   - I have approached patient with the following individualized plan to manage diabetes and patient agrees:   -In light of her presentation with still significantly above target glycemic profile, she will continue to need intensive treatment with higher dose of basal/bolus insulin in order for her to achieve and maintain control of diabetes to target.   -She has a tendency for binge consumption of sweets and sweetened beverages, promises to do better.  She also has problem with sleep, sleeps in the morning, delayed breakfast leads to a rare, random hypoglycemia in the early hours.  -She is advised to continue Basaglar 80 units  nightly, continue NovoLog 25  units 3 times a day before meals for pre-meal blood glucose above 90 mg/dL associated with strict monitoring of glucose 4 times a day-before meals and at bedtime.  - She is benefiting from continuous glucose monitoring, advised to wear it at all times.  -Patient is encouraged to call clinic for blood glucose levels less than 70 or above 200 mg /dl.   -She is  advised to continue Januvia 50 mg p.o. daily with breakfast.   -Her renal function is still abnormal, however stable.  She is advised to continue follow-up with nephrology.  She is off of Metformin for now.   - Patient specific target  A1c;  LDL, HDL, Triglycerides, were discussed in detail.  2) BP/HTN:  -Her blood pressure is controlled to target.   -She did get her blood pressure medications adjusted by her nephrologist currently on furosemide 40 mg p.o. daily, spironolactone 50 mg p.o. daily, as well as Hyzaar 50/12.5 mg p.o. daily.    3) Lipids/HPL: Her recent lipid panel showed controlled LDL at 68.  She is advised to continue simvastatin 40 mg p.o. nightly.    Side effects and precautions discussed with her.  4) subclinical hypothyroidism: Resolved.  Her subsequent full set of thyroid function tests are within normal limits.   She will not need intervention at this time.  She will need repeat thyroid function test after her next visit.    5) obesity: Her BMI is 43.4-- - clearly complicating her diabetes care.  She is a candidate for modest weight loss.  Carbs management discussed with her in detail. -Plant predominant Whole Foods lifestyle nutrition is discussed and recommended to her.   6) Chronic Care/Health Maintenance:  -Patient is on ARB and Statin medications and encouraged to continue to follow up with Ophthalmology, Podiatrist at least yearly or according to recommendations, and advised to   stay away from smoking. I have recommended yearly flu vaccine and pneumonia vaccination at least  every 5 years; and  sleep for at least 7 hours a day.   Patient cannot exercise optimally.    - I advised patient to maintain close follow up with Richarda Blade E for primary care needs.  I spent 41 minutes in the care of the patient today including review of labs from CMP, Lipids, Thyroid Function, Hematology (current and previous including abstractions from other facilities); face-to-face time discussing  her blood glucose readings/logs, discussing hypoglycemia and hyperglycemia episodes and symptoms, medications doses, her options of short and long term treatment based on the latest standards of care / guidelines;  discussion about incorporating lifestyle medicine;  and documenting the encounter.    Please refer to Patient Instructions for Blood Glucose Monitoring and Insulin/Medications Dosing Guide"  in media tab for additional information. Please  also refer to " Patient Self Inventory" in the Media  tab for reviewed elements of pertinent patient history.  Kenzlee Fishburn Haluska participated in the discussions, expressed understanding, and voiced agreement with the above plans.  All questions were answered to her satisfaction. she is encouraged to contact clinic should she have any questions or concerns prior to her return visit.  Follow up plan: - Return in about 4 months (around 06/29/2021) for F/U with Pre-visit Labs, Meter, Logs, A1c here.Marquis Lunch, MD Phone: [phone]  Fax: [phone]  This note was partially dictated with voice recognition software. Similar sounding words can be transcribed inadequately or may not  be corrected upon review.  02/27/2021, 12:49 PM

## 2021-02-27 NOTE — Patient Instructions (Signed)

## 2021-04-19 ENCOUNTER — Other Ambulatory Visit: Payer: Self-pay | Admitting: "Endocrinology

## 2021-04-19 DIAGNOSIS — E1122 Type 2 diabetes mellitus with diabetic chronic kidney disease: Secondary | ICD-10-CM

## 2021-05-28 ENCOUNTER — Other Ambulatory Visit (HOSPITAL_COMMUNITY): Payer: Self-pay | Admitting: Neurosurgery

## 2021-05-28 ENCOUNTER — Other Ambulatory Visit: Payer: Self-pay | Admitting: Neurosurgery

## 2021-05-28 DIAGNOSIS — M545 Low back pain, unspecified: Secondary | ICD-10-CM

## 2021-06-10 ENCOUNTER — Encounter (HOSPITAL_COMMUNITY): Payer: Self-pay

## 2021-06-10 ENCOUNTER — Ambulatory Visit (HOSPITAL_COMMUNITY): Payer: Medicare Other

## 2021-06-12 ENCOUNTER — Emergency Department (HOSPITAL_COMMUNITY)
Admission: EM | Admit: 2021-06-12 | Discharge: 2021-06-12 | Disposition: A | Discharge status: Home / Self Care | Payer: Medicare Other | Attending: Emergency Medicine | Admitting: Emergency Medicine

## 2021-06-12 ENCOUNTER — Emergency Department (HOSPITAL_COMMUNITY): Payer: Medicare Other

## 2021-06-12 ENCOUNTER — Encounter (HOSPITAL_COMMUNITY): Payer: Self-pay | Admitting: *Deleted

## 2021-06-12 DIAGNOSIS — N183 Chronic kidney disease, stage 3 unspecified: Secondary | ICD-10-CM | POA: Insufficient documentation

## 2021-06-12 DIAGNOSIS — M545 Low back pain, unspecified: Secondary | ICD-10-CM | POA: Diagnosis present

## 2021-06-12 DIAGNOSIS — Z7984 Long term (current) use of oral hypoglycemic drugs: Secondary | ICD-10-CM | POA: Diagnosis not present

## 2021-06-12 DIAGNOSIS — D631 Anemia in chronic kidney disease: Secondary | ICD-10-CM | POA: Diagnosis not present

## 2021-06-12 DIAGNOSIS — M5441 Lumbago with sciatica, right side: Secondary | ICD-10-CM | POA: Insufficient documentation

## 2021-06-12 DIAGNOSIS — E039 Hypothyroidism, unspecified: Secondary | ICD-10-CM | POA: Insufficient documentation

## 2021-06-12 DIAGNOSIS — Z79899 Other long term (current) drug therapy: Secondary | ICD-10-CM | POA: Diagnosis not present

## 2021-06-12 DIAGNOSIS — M5431 Sciatica, right side: Secondary | ICD-10-CM

## 2021-06-12 DIAGNOSIS — E1122 Type 2 diabetes mellitus with diabetic chronic kidney disease: Secondary | ICD-10-CM | POA: Insufficient documentation

## 2021-06-12 DIAGNOSIS — Z794 Long term (current) use of insulin: Secondary | ICD-10-CM | POA: Insufficient documentation

## 2021-06-12 DIAGNOSIS — I129 Hypertensive chronic kidney disease with stage 1 through stage 4 chronic kidney disease, or unspecified chronic kidney disease: Secondary | ICD-10-CM | POA: Insufficient documentation

## 2021-06-12 LAB — CBC
HCT: 36.6 % (ref 36.0–46.0)
Hemoglobin: 11.5 g/dL — ABNORMAL LOW (ref 12.0–15.0)
MCH: 27.8 pg (ref 26.0–34.0)
MCHC: 31.4 g/dL (ref 30.0–36.0)
MCV: 88.4 fL (ref 80.0–100.0)
Platelets: 61 10*3/uL — ABNORMAL LOW (ref 150–400)
RBC: 4.14 MIL/uL (ref 3.87–5.11)
RDW: 13.9 % (ref 11.5–15.5)
WBC: 9 10*3/uL (ref 4.0–10.5)
nRBC: 0 % (ref 0.0–0.2)

## 2021-06-12 LAB — BASIC METABOLIC PANEL
Anion gap: 9 (ref 5–15)
BUN: 22 mg/dL (ref 8–23)
CO2: 21 mmol/L — ABNORMAL LOW (ref 22–32)
Calcium: 8.6 mg/dL — ABNORMAL LOW (ref 8.9–10.3)
Chloride: 103 mmol/L (ref 98–111)
Creatinine, Ser: 1.33 mg/dL — ABNORMAL HIGH (ref 0.44–1.00)
GFR, Estimated: 42 mL/min — ABNORMAL LOW (ref >60)
Glucose, Bld: 134 mg/dL — ABNORMAL HIGH (ref 70–99)
Potassium: 3.7 mmol/L (ref 3.5–5.1)
Sodium: 133 mmol/L — ABNORMAL LOW (ref 135–145)

## 2021-06-12 MED ORDER — KETOROLAC TROMETHAMINE 15 MG/ML IJ SOLN
15.0000 mg | Freq: Once | INTRAMUSCULAR | Status: AC
  Administered 2021-06-12: 15:00:00 15 mg via INTRAMUSCULAR

## 2021-06-12 MED ORDER — CYCLOBENZAPRINE HCL 10 MG PO TABS: 10.0000 mg | ORAL_TABLET | Freq: Two times a day (BID) | ORAL | 0 refills | Status: AC | PRN

## 2021-06-12 MED ORDER — LIDOCAINE 5 % EX PTCH
1.0000 | MEDICATED_PATCH | CUTANEOUS | 0 refills | Status: DC
Start: 2021-06-12 — End: 2021-07-07

## 2021-06-12 MED ORDER — CYCLOBENZAPRINE HCL 10 MG PO TABS
10.0000 mg | ORAL_TABLET | Freq: Once | ORAL | Status: AC
  Administered 2021-06-12: 15:00:00 10 mg via ORAL
  Filled 2021-06-12: qty 1

## 2021-06-12 NOTE — Discharge Instructions (Signed)
Your MRI was reassuring today.  This is likely sciatica.  I have given you Flexeril which is a muscle relaxer.  You can take this twice daily.  Please do not mix with alcohol or operate heavy machinery while taking this medication as it can make you drowsy.  I have also given you lidocaine patches for local pain control.  Please call your back doctor next week.  Please return to the emergency department if you experience worsening symptoms, bowel/bladder incontinence, weakness to your lower extremities, or any other concerns you might have.

## 2021-06-12 NOTE — ED Triage Notes (Signed)
C/o back pain, states her back has given out

## 2021-06-12 NOTE — ED Provider Notes (Signed)
[facility] EMERGENCY DEPARTMENT Provider Note   CSN: [id number] Arrival date & time: 06/12/21  1336     History Chief Complaint  Patient presents with   Back Pain    Tiffany Brock is a 75 y.o. female with history of lumbar radiculopathy, lumbar spinal stenosis, lumbar spondylosis versus, and prior lumbar fusion who presents the emergency department with right lower back pain that acutely got worse over the last 3 to 4 days.  Patient states that she has been having some back pain since Thanksgiving which is worse with movement.  She was seen and evaluated by her neurosurgeon at that time who gave her some pain medication which improved for some time.  Over the last several days, she has been having increased amount of pain which she describes as a sharp sensation that radiates down the right leg.  It is worse with any movement and has been constant this week.  She has not taken any medications.  Patient denies any fever, chills, cough, congestion, abdominal pain, nausea, vomiting, diarrhea, urinary complaints, bowel/bladder incontinence.  No weakness or numbness to the lower legs.   Back Pain Associated symptoms: no dysuria       Past Medical History:  Diagnosis Date   Anemia    Arthritis    Diabetes mellitus, type II (HCC)    Dyspnea    on exertion   Headache    Hyperlipidemia    Hypertension    Iron deficiency anemia with multiple negative GI work-ups question AVMs 06/28/2019   Pneumonia    h/o   PONV (postoperative nausea and vomiting)    Sleep apnea    Vertigo     Patient Active Problem List   Diagnosis Date Noted   Subclinical hypothyroidism 01/16/2020   Vitamin D deficiency 01/16/2020   Irritable bowel syndrome with both constipation and diarrhea 06/28/2019   Iron deficiency anemia with multiple negative GI work-ups question AVMs 06/28/2019   Full incontinence of feces- improved after pelvic PT 06/28/2019   Lumbar stenosis with neurogenic claudication 07/11/2018    Uncontrolled type 2 diabetes mellitus with stage 3 chronic kidney disease, with long-term current use of insulin 01/24/2016   Mixed hyperlipidemia 01/24/2016   Essential hypertension, benign 01/24/2016   Morbid obesity due to excess calories (HCC) 01/24/2016    Past Surgical History:  Procedure Laterality Date   ABDOMINAL HYSTERECTOMY     APPENDECTOMY     BLEPHAROPLASTY Bilateral 01/2018   CARPAL TUNNEL RELEASE Left 2008   COLONOSCOPY     multiple   ESOPHAGOGASTRODUODENOSCOPY     multiple   GIVENS CAPSULE STUDY     HEMORROIDECTOMY     UMBILICAL HERNIA REPAIR       OB History   No obstetric history on file.     Family History  Problem Relation Age of Onset   Heart disease Brother     Social History   Tobacco Use   Smoking status: Never   Smokeless tobacco: Never  Vaping Use   Vaping Use: Never used  Substance Use Topics   Alcohol use: No   Drug use: No    Home Medications Prior to Admission medications   Medication Sig Start Date End Date Taking? Authorizing Provider  cyclobenzaprine (FLEXERIL) 10 MG tablet Take 1 tablet (10 mg total) by mouth 2 (two) times daily as needed for muscle spasms. 06/12/21  Yes Keyron Pokorski M, PA-C  lidocaine (LIDODERM) 5 % Place 1 patch onto the skin daily. Remove & Discard  patch within 12 hours or as directed by MD 06/12/21  Yes Raul Del, Tylisa Alcivar M, PA-C  albuterol (PROVENTIL HFA;VENTOLIN HFA) 108 (90 Base) MCG/ACT inhaler Inhale 2 puffs into the lungs every 6 (six) hours as needed for wheezing or shortness of breath.    [provider]  B-D ULTRAFINE III SHORT PEN 31G X 8 MM MISC 1 EACH BY OTHER ROUTE SEE ADMIN INSTRUCTIONS. USE ONE PEN NEEDLE FOUR TIMES DAILY TO INJECT INSULIN 01/20/21   Cassandria Anger, MD  Continuous Blood Gluc Sensor (FREESTYLE LIBRE SENSOR SYSTEM) MISC Use one sensor every 10 days. 02/12/17   Cassandria Anger, MD  esomeprazole (NEXIUM) 40 MG capsule Take 40 mg by mouth daily. 12/10/15    [provider]  Ferrous Fumarate (HEMOCYTE - 106 MG FE) 324 (106 Fe) MG TABS tablet Take 1 tablet by mouth.    [provider]  fluticasone Asencion Islam) 50 MCG/ACT nasal spray  05/26/19   [provider]  furosemide (LASIX) 40 MG tablet Take 20 mg by mouth 2 (two) times daily. 10/04/19   [provider]  gabapentin (NEURONTIN) 300 MG capsule Take 300 mg by mouth at bedtime. 2 tablets at bedtime    [provider]  Insulin Glargine (BASAGLAR KWIKPEN) 100 UNIT/ML INJECT 0.8 MLS (80 UNITS TOTAL) INTO THE SKIN AT BEDTIME. 04/21/21   Nida, Marella Chimes, MD  losartan-hydrochlorothiazide (HYZAAR) 50-12.5 MG tablet Take 1 tablet by mouth daily.    [provider]  NON FORMULARY CPAP    [provider]  NOVOLOG FLEXPEN 100 UNIT/ML FlexPen INJECT 25-31 UNITS INTO THE SKIN 3 (THREE) TIMES DAILY WITH MEALS. 12/09/20   Cassandria Anger, MD  PARoxetine (PAXIL) 10 MG tablet Take 10 mg by mouth daily. 10/17/15   [provider]  simvastatin (ZOCOR) 40 MG tablet Take 40 mg by mouth daily.    [provider]  sitaGLIPtin (JANUVIA) 50 MG tablet Take 1 tablet (50 mg total) by mouth daily. 06/27/19   Cassandria Anger, MD  spironolactone (ALDACTONE) 50 MG tablet Take 50 mg by mouth daily. Patient not taking: Reported on 02/27/2021 10/04/19   [provider]  Vitamin D, Ergocalciferol, (DRISDOL) 50000 units CAPS capsule Take 50,000 Units by mouth every 14 (fourteen) days.    [provider]    Allergies    Biaxin [clarithromycin]  Review of Systems   Review of Systems  Genitourinary:  Negative for dysuria, frequency and hematuria.  Musculoskeletal:  Positive for back pain.  All other systems reviewed and are negative.  Physical Exam Updated Vital Signs BP (!) 130/48 (BP Location: Right Arm)    Pulse 62    Temp 97.8 F (36.6 C) (Oral)    Resp 16    SpO2 100%   Physical Exam Vitals and nursing note reviewed.   Constitutional:      General: She is not in acute distress.    Appearance: Normal appearance.  HENT:     Head: Normocephalic and atraumatic.  Eyes:     General:        Right eye: No discharge.        Left eye: No discharge.     Conjunctiva/sclera: Conjunctivae normal.  Cardiovascular:     Comments: Regular rate and rhythm.  S1/S2 are distinct without any evidence of murmur, rubs, or gallops.  Radial pulses are 2+ bilaterally.  Dorsalis pedis pulses are 2+ bilaterally.  No evidence of pedal edema. Pulmonary:     Effort: Pulmonary effort  is normal.     Comments: Clear to auscultation bilaterally.  Normal effort.  No respiratory distress.  No evidence of wheezes, rales, or rhonchi heard throughout. Abdominal:     General: Abdomen is flat. Bowel sounds are normal. There is no distension.     Tenderness: There is no abdominal tenderness. There is no guarding or rebound.  Musculoskeletal:        General: Normal range of motion.     Cervical back: Neck supple.     Comments: There is tenderness over the lumbar spine and right paralumbar musculature.  Positive straight leg raise on the right.  Difficult to fully assess range of motion of the lower extremities secondary to pain.  She does have equal strength.  No significant changes in sensation from her baseline.  Skin:    General: Skin is warm and dry.     Findings: No rash.  Neurological:     General: No focal deficit present.     Mental Status: She is alert.  Psychiatric:        Mood and Affect: Mood normal.        Behavior: Behavior normal.    ED Results / Procedures / Treatments   Labs (all labs ordered are listed, but only abnormal results are displayed) Labs Reviewed  CBC - Abnormal; Notable for the following components:      Result Value   Hemoglobin 11.5 (*)    Platelets 61 (*)    All other components within normal limits  BASIC METABOLIC PANEL - Abnormal; Notable for the following components:   Sodium 133 (*)    CO2 21  (*)    Glucose, Bld 134 (*)    Creatinine, Ser 1.33 (*)    Calcium 8.6 (*)    GFR, Estimated 42 (*)    All other components within normal limits    EKG None  Radiology MR LUMBAR SPINE WO CONTRAST  Result Date: 06/12/2021 CLINICAL DATA:  Low back pain, prior surgery.  New symptoms. EXAM: MRI LUMBAR SPINE WITHOUT CONTRAST TECHNIQUE: Multiplanar, multisequence MR imaging of the lumbar spine was performed. No intravenous contrast was administered. COMPARISON:  Radiographs 05/28/2021, CT 02/03/2018 and MRI 01/12/2018. FINDINGS: Segmentation: Conventional anatomy assumed, with the last open disc space designated L5-S1.Concordant with previous imaging. Alignment:  Physiologic. Vertebrae: No worrisome osseous lesion, acute fracture or pars defect. Status post interval laminectomy and PLIF at L5-S1. X stop device and solid posterior fusion at L4-5 appear unchanged. Mild sacroiliac degenerative changes bilaterally. Conus medullaris: Extends to the L1 level and appears normal. Paraspinal and other soft tissues: No significant paraspinal findings. There is chronic subcutaneous edema within the lower back. A cystic lesion in the interpolar region of the left kidney is incompletely visualized and evaluated, although grossly unchanged in size from previous MRI. There are probable renal parapelvic cysts bilaterally. Disc levels: L1-2: Mild disc bulging and facet hypertrophy. No spinal stenosis or nerve root encroachment. L2-3: Disc bulging eccentric to the left with mild facet and ligamentous hypertrophy. Stable mild left foraminal narrowing with potential chronic left L2 nerve root encroachment. The spinal canal and right foramen are patent. L3-4: Stable mild disc bulging eccentric to the left with moderate facet and ligamentous hypertrophy. Borderline spinal stenosis with mild narrowing of the left foramen and both lateral recesses, stable. L4-5: Solid posterior fusion status post remote inter spinous fusion.  Stable mild left lateral recess narrowing. The foramina are patent. L5-S1: The spinal canal appears decompressed by the interval laminectomies  and partial facetectomies. Probable epidural fibrosis surrounding the left S1 nerve root sleeve. Mild residual narrowing of the lateral recesses and foramina bilaterally. IMPRESSION: 1. Interval laminectomies and PLIF at L5-S1 with decompression of the spinal canal. Mild residual lateral recess and foraminal narrowing without nerve root encroachment. 2. Stable solid posterior fusion at L4-5 without significant spinal stenosis. 3. Stable appearance of the L3-4 disc space without significant adjacent segment disease. 4. Stable chronic mild left foraminal narrowing at L2-3. 5. No acute findings identified. Electronically Signed   By: Richardean Sale M.D.   On: 06/12/2021 15:47    Procedures Procedures   Medications Ordered in ED Medications  ketorolac (TORADOL) 15 MG/ML injection 15 mg (15 mg Intramuscular Given 06/12/21 1458)  cyclobenzaprine (FLEXERIL) tablet 10 mg (10 mg Oral Given 06/12/21 1446)    ED Course  I have reviewed the triage vital signs and the nursing notes.  Pertinent labs & imaging results that were available during my care of the patient were reviewed by me and considered in my medical decision making (see chart for details).  Clinical Course as of 06/12/21 1625  Thu Jun 12, 2021  1437 I discussed this case with my attending physician who cosigned this note including patient's presenting symptoms, physical exam, and planned diagnostics and interventions. Attending physician stated agreement with plan or made changes to plan which were implemented.      [CF]    Clinical Course User Index [CF] Cherrie Gauze   MDM Rules/Calculators/A&P                          YALONDA DORVIL is a 75 y.o. female who presents the emergency department with lower back pain.  Given the clinical scenario I am suspicious that this is likely  sciatica.  Patient does have a MRI scheduled through [facility] for next Wednesday.  Given that his around the holidays and in the event that the patient might bounce back over the weekend where MRI might be limited.  We get the MRI done in the emergency department.  Basic labs will also be obtained.  Toradol and muscle relaxants for pain control.  At this point, I have a low suspicion for cauda equina syndrome, epidural abscess, or other acute emergent causes of her back pain at this time.  CBC was without any significant abnormality.  BMP showed elevated glucose and elevated creatinine in the setting of chronic kidney disease.  MRI showed some postsurgical changes but no acute pathology at this time.  Given the clinical scenario, I do believe this is likely sciatica.  She is feeling better on my reevaluation.  I will have her call her neurosurgery team.  I will prescribe her muscle relaxants and Lidoderm patches.  Strict turn precautions given.  All questions or concerns addressed.  She is safe for discharge.     Final Clinical Impression(s) / ED Diagnoses Final diagnoses:  Sciatica of right side    Rx / DC Orders ED Discharge Orders          Ordered    lidocaine (LIDODERM) 5 %  Every 24 hours        06/12/21 1624    cyclobenzaprine (FLEXERIL) 10 MG tablet  2 times daily PRN        06/12/21 1624             Myna Bright [place], [place] 06/12/21 1626    Long, Wonda [facility], MD  06/17/21 0245 ° °

## 2021-06-18 ENCOUNTER — Encounter (HOSPITAL_COMMUNITY): Payer: Self-pay

## 2021-06-18 ENCOUNTER — Ambulatory Visit (HOSPITAL_COMMUNITY): Payer: Medicare Other | Attending: Neurosurgery

## 2021-06-30 ENCOUNTER — Ambulatory Visit: Payer: Medicare Other | Admitting: "Endocrinology

## 2021-06-30 ENCOUNTER — Encounter: Payer: Self-pay | Admitting: "Endocrinology

## 2021-06-30 LAB — LIPID PANEL
Cholesterol: 140 (ref 0–200)
HDL: 55 (ref 35–70)
LDL Cholesterol: 64
Triglycerides: 103 (ref 40–160)

## 2021-06-30 LAB — BASIC METABOLIC PANEL
BUN: 21 (ref 4–21)
CO2: 31 — AB (ref 13–22)
Chloride: 104 (ref 99–108)
Creatinine: 1.4 — AB (ref 0.5–1.1)
Glucose: 121
Potassium: 4.3 (ref 3.4–5.3)

## 2021-06-30 LAB — COMPREHENSIVE METABOLIC PANEL
Albumin: 3.3 — AB (ref 3.5–5.0)
Calcium: 9.1 (ref 8.7–10.7)

## 2021-06-30 LAB — TSH: TSH: 2.48 (ref 0.41–5.90)

## 2021-07-07 ENCOUNTER — Ambulatory Visit: Payer: Medicare Other | Admitting: "Endocrinology

## 2021-07-07 ENCOUNTER — Other Ambulatory Visit: Payer: Self-pay

## 2021-07-07 ENCOUNTER — Other Ambulatory Visit: Payer: Self-pay | Admitting: "Endocrinology

## 2021-07-07 ENCOUNTER — Encounter: Payer: Self-pay | Admitting: "Endocrinology

## 2021-07-07 VITALS — BP 104/62 | HR 76 | Ht 65.0 in | Wt 262.8 lb

## 2021-07-07 DIAGNOSIS — E1122 Type 2 diabetes mellitus with diabetic chronic kidney disease: Secondary | ICD-10-CM

## 2021-07-07 DIAGNOSIS — N183 Chronic kidney disease, stage 3 unspecified: Secondary | ICD-10-CM

## 2021-07-07 DIAGNOSIS — E038 Other specified hypothyroidism: Secondary | ICD-10-CM | POA: Diagnosis not present

## 2021-07-07 DIAGNOSIS — Z794 Long term (current) use of insulin: Secondary | ICD-10-CM | POA: Diagnosis not present

## 2021-07-07 DIAGNOSIS — E782 Mixed hyperlipidemia: Secondary | ICD-10-CM | POA: Diagnosis not present

## 2021-07-07 DIAGNOSIS — E559 Vitamin D deficiency, unspecified: Secondary | ICD-10-CM

## 2021-07-07 DIAGNOSIS — I10 Essential (primary) hypertension: Secondary | ICD-10-CM

## 2021-07-07 LAB — POCT GLYCOSYLATED HEMOGLOBIN (HGB A1C): HbA1c, POC (controlled diabetic range): 8 % — AB (ref 0.0–7.0)

## 2021-07-07 MED ORDER — FREESTYLE LIBRE 2 SENSOR MISC: 1.0000 | 3 refills | Status: DC

## 2021-07-07 MED ORDER — GLIPIZIDE ER 5 MG PO TB24: 5.0000 mg | ORAL_TABLET | Freq: Every day | ORAL | 3 refills | Status: DC

## 2021-07-07 MED ORDER — FREESTYLE LIBRE 2 READER DEVI: 0 refills | Status: DC

## 2021-07-07 NOTE — Progress Notes (Signed)
07/07/2021  Endocrinology follow-up note   Subjective:    Patient ID: Tiffany Brock, female    DOB: 1946-05-30.  She is here for follow-up  for management of type 2 diabetes, hyperlipidemia, hypertension.  PCP: Ephriam Jenkins E  Past Medical History:  Diagnosis Date   Anemia    Arthritis    Diabetes mellitus, type II (HCC)    Dyspnea    on exertion   Headache    Hyperlipidemia    Hypertension    Iron deficiency anemia with multiple negative GI work-ups question AVMs 06/28/2019   Pneumonia    h/o   PONV (postoperative nausea and vomiting)    Sleep apnea    Vertigo    Past Surgical History:  Procedure Laterality Date   ABDOMINAL HYSTERECTOMY     APPENDECTOMY     BLEPHAROPLASTY Bilateral 01/2018   CARPAL TUNNEL RELEASE Left 2008   COLONOSCOPY     multiple   ESOPHAGOGASTRODUODENOSCOPY     multiple   GIVENS CAPSULE STUDY     HEMORROIDECTOMY     UMBILICAL HERNIA REPAIR     Social History   Socioeconomic History   Marital status: Married    Spouse name: Not on file   Number of children: Not on file   Years of education: Not on file   Highest education level: Not on file  Occupational History   Not on file  Tobacco Use   Smoking status: Never   Smokeless tobacco: Never  Vaping Use   Vaping Use: Never used  Substance and Sexual Activity   Alcohol use: No   Drug use: No   Sexual activity: Not on file  Other Topics Concern   Not on file  Social History Narrative   Married and retired   Lives in [place], [place]   No hx EtOH/tobacco/drugs   Social Determinants of Health   Financial Resource Strain: Not on file  Food Insecurity: Not on file  Transportation Needs: Not on file  Physical Activity: Not on file  Stress: Not on file  Social Connections: Not on file   Outpatient Encounter Medications as of 07/07/2021  Medication Sig   Continuous Blood Gluc Receiver (FREESTYLE LIBRE 2 READER) DEVI As directed   Continuous Blood Gluc Sensor (FREESTYLE LIBRE 2  SENSOR) MISC 1 Piece by Does not apply route every 14 (fourteen) days.   glipiZIDE (GLUCOTROL XL) 5 MG 24 hr tablet Take 1 tablet (5 mg total) by mouth daily with breakfast.   albuterol (PROVENTIL HFA;VENTOLIN HFA) 108 (90 Base) MCG/ACT inhaler Inhale 2 puffs into the lungs every 6 (six) hours as needed for wheezing or shortness of breath.   B-D ULTRAFINE III SHORT PEN 31G X 8 MM MISC 1 EACH BY OTHER ROUTE SEE ADMIN INSTRUCTIONS. USE ONE PEN NEEDLE FOUR TIMES DAILY TO INJECT INSULIN   cyclobenzaprine (FLEXERIL) 10 MG tablet Take 1 tablet (10 mg total) by mouth 2 (two) times daily as needed for muscle spasms.   esomeprazole (NEXIUM) 40 MG capsule Take 40 mg by mouth daily.   Ferrous Fumarate (HEMOCYTE - 106 MG FE) 324 (106 Fe) MG TABS tablet Take 1 tablet by mouth.   fluticasone (FLONASE) 50 MCG/ACT nasal spray    furosemide (LASIX) 40 MG tablet Take 20 mg by mouth 2 (two) times daily.   gabapentin (NEURONTIN) 300 MG capsule Take 300 mg by mouth at bedtime. 2 tablets at bedtime   Insulin Glargine (BASAGLAR KWIKPEN) 100 UNIT/ML INJECT 0.8 MLS (80 UNITS TOTAL) INTO  THE SKIN AT BEDTIME.   losartan-hydrochlorothiazide (HYZAAR) 50-12.5 MG tablet Take 1 tablet by mouth daily.   NON FORMULARY CPAP   NOVOLOG FLEXPEN 100 UNIT/ML FlexPen INJECT 25-31 UNITS INTO THE SKIN 3 (THREE) TIMES DAILY WITH MEALS.   PARoxetine (PAXIL) 10 MG tablet Take 10 mg by mouth daily.   simvastatin (ZOCOR) 40 MG tablet Take 40 mg by mouth daily.   sitaGLIPtin (JANUVIA) 50 MG tablet Take 1 tablet (50 mg total) by mouth daily.   Vitamin D, Ergocalciferol, (DRISDOL) 50000 units CAPS capsule Take 50,000 Units by mouth every 14 (fourteen) days.   [DISCONTINUED] Continuous Blood Gluc Sensor (FREESTYLE LIBRE SENSOR SYSTEM) MISC Use one sensor every 10 days.   [DISCONTINUED] lidocaine (LIDODERM) 5 % Place 1 patch onto the skin daily. Remove & Discard patch within 12 hours or as directed by MD   [DISCONTINUED] spironolactone (ALDACTONE)  50 MG tablet Take 50 mg by mouth daily. (Patient not taking: Reported on 02/27/2021)   No facility-administered encounter medications on file as of 07/07/2021.   ALLERGIES: Allergies  Allergen Reactions   Biaxin [Clarithromycin] Hives   VACCINATION STATUS:  There is no immunization history on file for this patient.  Diabetes She presents for her follow-up diabetic visit. She has type 2 diabetes mellitus. Onset time: She was diagnosed at approximate age of 61 years. Her disease course has been stable. There are no hypoglycemic associated symptoms. Pertinent negatives for hypoglycemia include no confusion, headaches, pallor or seizures. Associated symptoms include fatigue. Pertinent negatives for diabetes include no blurred vision, no chest pain, no polydipsia, no polyphagia and no polyuria. There are no hypoglycemic complications. Symptoms are improving. Risk factors for coronary artery disease include diabetes mellitus, dyslipidemia, family history, hypertension, obesity and sedentary lifestyle. Current diabetic treatment includes insulin injections and oral agent (dual therapy). Her weight is fluctuating minimally. She is following a generally unhealthy diet. When asked about meal planning, she reported none. She has had a previous visit with a dietitian. She never participates in exercise. Her home blood glucose trend is fluctuating minimally. Her breakfast blood glucose range is generally 140-180 mg/dl. Her lunch blood glucose range is generally 180-200 mg/dl. Her dinner blood glucose range is generally 180-200 mg/dl. Her bedtime blood glucose range is generally 180-200 mg/dl. Her overall blood glucose range is 180-200 mg/dl. (Ms. Simone Curia presents with her CGM device.  AGP analysis shows 37% time range, 73% time above range.  No hypoglycemia.  Her point-of-care A1c is 8%, progressively improving.     ) An ACE inhibitor/angiotensin II receptor blocker is being taken. She sees a podiatrist.Eye exam is  current.  Hyperlipidemia This is a chronic problem. The current episode started more than 1 year ago. The problem is controlled. Exacerbating diseases include diabetes and obesity. Pertinent negatives include no chest pain, myalgias or shortness of breath. Current antihyperlipidemic treatment includes statins. Risk factors for coronary artery disease include diabetes mellitus, dyslipidemia, family history, obesity, hypertension and a sedentary lifestyle.  Hypertension This is a chronic problem. The current episode started more than 1 year ago. Pertinent negatives include no blurred vision, chest pain, headaches, palpitations or shortness of breath. Risk factors for coronary artery disease include dyslipidemia, diabetes mellitus, family history, obesity and sedentary lifestyle. Past treatments include angiotensin blockers.   Review of systems  Constitutional: + Minimally fluctuating body weight,  current  Body mass index is 43.73 kg/m. , + fatigue, no subjective hyperthermia, no subjective hypothermia    Objective:    BP 104/62  Pulse 76    Ht 5\' 5"  (1.651 m)    Wt 262 lb 12.8 oz (119.2 kg)    BMI 43.73 kg/m   Wt Readings from Last 3 Encounters:  07/07/21 262 lb 12.8 oz (119.2 kg)  02/27/21 261 lb (118.4 kg)  11/22/20 266 lb 3.2 oz (120.7 kg)     CMP     Component Value Date/Time   NA 133 (L) 06/12/2021 1452   NA 141 11/15/2020 0000   K 4.3 06/30/2021 0000   CL 104 06/30/2021 0000   CO2 31 (A) 06/30/2021 0000   GLUCOSE 134 (H) 06/12/2021 1452   BUN 21 06/30/2021 0000   CREATININE 1.4 (A) 06/30/2021 0000   CREATININE 1.33 (H) 06/12/2021 1452   CALCIUM 9.1 06/30/2021 0000   ALBUMIN 3.3 (A) 06/30/2021 0000   AST 19 11/15/2020 0000   ALT 19 11/15/2020 0000   ALKPHOS 70 11/15/2020 0000   GFRNONAA 42 (L) 06/12/2021 1452   GFRAA 51 11/15/2020 0000   Diabetic Labs (most recent): Lab Results  Component Value Date   HGBA1C 8.0 (A) 07/07/2021   HGBA1C 8.3 (A) 11/22/2020   HGBA1C  7.9 (A) 08/21/2020    Lipid Panel     Component Value Date/Time   CHOL 140 06/30/2021 0000   TRIG 103 06/30/2021 0000   HDL 55 06/30/2021 0000   LDLCALC 64 06/30/2021 0000     Assessment & Plan:   1. 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  Ms. Simone Curia presents with her CGM device.  AGP analysis shows 37% time range, 73% time above range.  No hypoglycemia.  Her point-of-care A1c is 8%, progressively improving.      Her diabetes is complicated by  CKD, obesity , and sedentary life and patient remains at a high risk for more acute and chronic complications of diabetes which include CAD, CVA, CKD, retinopathy, and neuropathy. These are all discussed in detail with the patient.  - I have counseled the patient on diet management and weight loss, by adopting a carbohydrate restricted/protein rich diet.  - she acknowledges that there is a room for improvement in her food and drink choices. - Suggestion is made for her to avoid simple carbohydrates  from her diet including Cakes, Sweet Desserts, Ice Cream, Soda (diet and regular), Sweet Tea, Candies, Chips, Cookies, Store Bought Juices, Alcohol in Excess of  1-2 drinks a day, Artificial Sweeteners,  Coffee Creamer, and "Sugar-free" Products, Lemonade. This will help patient to have more stable blood glucose profile and potentially avoid unintended weight gain.  - I encouraged the patient to switch to unprocessed or minimally processed complex starch and increased protein intake (animal or plant source), fruits, and vegetables.  - Patient is advised to stick to a routine mealtimes to eat 3 meals  a day and avoid unnecessary snacks ( to snack only to correct hypoglycemia).   - I have approached patient with the following individualized plan to manage diabetes and patient agrees:    -In light of her presentation with still significantly above target glycemic profile, she will continue to need intensive treatment with basal/bolus insulin in order for her to maintain control of diabetes to target.    -Accordingly, she is advised to continue Basaglar 80 units nightly, continue NovoLog 25 units 3 times a day before meals for pre-meal blood  glucose above 90 mg/dL associated with strict monitoring of glucose 4 times a day-before meals and at bedtime. She is benefiting and advised to use her CGM at all times.  -Patient is encouraged to call clinic for blood glucose levels less than 70 or above 200 mg /dl.   -She is advised to continue Januvia 50 mg p.o. daily with breakfast.   -Her renal function is still abnormal, however stable.  She is advised to continue follow-up with nephrology.  She is off of Metformin for now. -I discussed and added glipizide 5 mg p.o. daily at breakfast.   - Patient specific target  A1c;  LDL, HDL, Triglycerides, were discussed in detail.  2) BP/HTN:  -Her blood pressure is controlled to target.   -She did get her blood pressure medications adjusted by her nephrologist currently on furosemide 40 mg p.o. daily, spironolactone 50 mg p.o. daily, as well as Hyzaar 50/12.5 mg p.o. daily.    3) Lipids/HPL: Her recent lipid panel showed controlled LDL at 68.  She is advised to continue simvastatin 40 mg p.o. nightly.   Side effects and precautions discussed with her.  4) subclinical hypothyroidism: Resolved.  Her subsequent full set of thyroid function tests are within normal limits.   She will not need intervention at this time.  She will need repeat thyroid function test after her next visit.    5) obesity: Her BMI is [id number]- clearly complicating her diabetes care.  She is a candidate for modest weight loss.  Carbs management discussed with her in detail. -Plant predominant Whole Foods lifestyle nutrition is discussed and recommended to her.   6)  Chronic Care/Health Maintenance:  -Patient is on ARB and Statin medications and encouraged to continue to follow up with Ophthalmology, Podiatrist at least yearly or according to recommendations, and advised to   stay away from smoking. I have recommended yearly flu vaccine and pneumonia vaccination at least every 5 years; and  sleep for at least 7 hours a day.   Patient cannot exercise optimally.    - I advised patient to maintain close follow up with Ephriam Jenkins E for primary care needs.  I spent 44 minutes in the care of the patient today including review of labs from [place], Lipids, Thyroid Function, Hematology (current and previous including abstractions from other facilities); face-to-face time discussing  her blood glucose readings/logs, discussing hypoglycemia and hyperglycemia episodes and symptoms, medications doses, her options of short and long term treatment based on the latest standards of care / guidelines;  discussion about incorporating lifestyle medicine;  and documenting the encounter.    Please refer to Patient Instructions for Blood Glucose Monitoring and Insulin/Medications Dosing Guide"  in media tab for additional information. Please  also refer to " Patient Self Inventory" in the Media  tab for reviewed elements of pertinent patient history.  Lisanna Kett Kratky participated in the discussions, expressed understanding, and voiced agreement with the above plans.  All questions were answered to her satisfaction. she is encouraged to contact clinic should she have any questions or concerns prior to her return visit.   Follow up plan: - Return in about 4 months (around 11/04/2021) for Bring Meter and Logs- A1c in Office.  Glade Lloyd, MD Phone: [phone]  Fax: [phone]  This note was partially dictated with voice recognition software. Similar sounding words can be transcribed inadequately or may not  be corrected upon review.  07/07/2021, 1:48 PM

## 2021-07-07 NOTE — Patient Instructions (Signed)

## 2021-07-08 ENCOUNTER — Other Ambulatory Visit: Payer: Self-pay | Admitting: "Endocrinology

## 2021-07-08 DIAGNOSIS — E1122 Type 2 diabetes mellitus with diabetic chronic kidney disease: Secondary | ICD-10-CM

## 2021-07-27 ENCOUNTER — Other Ambulatory Visit: Payer: Self-pay | Admitting: "Endocrinology

## 2021-09-14 ENCOUNTER — Other Ambulatory Visit: Payer: Self-pay | Admitting: "Endocrinology

## 2021-09-14 DIAGNOSIS — E1122 Type 2 diabetes mellitus with diabetic chronic kidney disease: Secondary | ICD-10-CM

## 2021-10-05 ENCOUNTER — Other Ambulatory Visit: Payer: Self-pay | Admitting: "Endocrinology

## 2021-10-06 ENCOUNTER — Other Ambulatory Visit: Payer: Self-pay | Admitting: "Endocrinology

## 2021-11-04 ENCOUNTER — Ambulatory Visit (INDEPENDENT_AMBULATORY_CARE_PROVIDER_SITE_OTHER): Payer: Medicare Other | Admitting: "Endocrinology

## 2021-11-04 ENCOUNTER — Encounter: Payer: Self-pay | Admitting: "Endocrinology

## 2021-11-04 VITALS — BP 126/66 | HR 72 | Ht 65.0 in | Wt 268.0 lb

## 2021-11-04 DIAGNOSIS — E038 Other specified hypothyroidism: Secondary | ICD-10-CM

## 2021-11-04 DIAGNOSIS — E782 Mixed hyperlipidemia: Secondary | ICD-10-CM | POA: Diagnosis not present

## 2021-11-04 DIAGNOSIS — E1122 Type 2 diabetes mellitus with diabetic chronic kidney disease: Secondary | ICD-10-CM

## 2021-11-04 DIAGNOSIS — I10 Essential (primary) hypertension: Secondary | ICD-10-CM | POA: Diagnosis not present

## 2021-11-04 DIAGNOSIS — Z794 Long term (current) use of insulin: Secondary | ICD-10-CM

## 2021-11-04 DIAGNOSIS — N183 Chronic kidney disease, stage 3 unspecified: Secondary | ICD-10-CM

## 2021-11-04 LAB — POCT GLYCOSYLATED HEMOGLOBIN (HGB A1C): HbA1c, POC (controlled diabetic range): 7.6 % — AB (ref 0.0–7.0)

## 2021-11-04 MED ORDER — GLIPIZIDE ER 5 MG PO TB24: 5.0000 mg | ORAL_TABLET | Freq: Every day | ORAL | 1 refills | Status: DC

## 2021-11-04 NOTE — Progress Notes (Signed)
?11/04/2021 ? ?Endocrinology follow-up note ? ? ?Subjective:  ? ? Patient ID: Tiffany Brock, female    DOB: September 13, 1945.  She is here for follow-up  for management of type 2 diabetes, hyperlipidemia, hypertension. ? ?PCP: Ephriam Jenkins E ? ?Past Medical History:  ?Diagnosis Date  ? Anemia   ? Arthritis   ? Diabetes mellitus, type II ([place])   ? Dyspnea   ? on exertion  ? Headache   ? Hyperlipidemia   ? Hypertension   ? Iron deficiency anemia with multiple negative GI work-ups question AVMs 06/28/2019  ? Pneumonia   ? h/o  ? PONV (postoperative nausea and vomiting)   ? Sleep apnea   ? Vertigo   ? ?Past Surgical History:  ?Procedure Laterality Date  ? ABDOMINAL HYSTERECTOMY    ? APPENDECTOMY    ? BLEPHAROPLASTY Bilateral 01/2018  ? CARPAL TUNNEL RELEASE Left 2008  ? COLONOSCOPY    ? multiple  ? ESOPHAGOGASTRODUODENOSCOPY    ? multiple  ? GIVENS CAPSULE STUDY    ? HEMORROIDECTOMY    ? UMBILICAL HERNIA REPAIR    ? ?Social History  ? ?Socioeconomic History  ? Marital status: Married  ?  Spouse name: Not on file  ? Number of children: Not on file  ? Years of education: Not on file  ? Highest education level: Not on file  ?Occupational History  ? Not on file  ?Tobacco Use  ? Smoking status: Never  ? Smokeless tobacco: Never  ?Vaping Use  ? Vaping Use: Never used  ?Substance and Sexual Activity  ? Alcohol use: No  ? Drug use: No  ? Sexual activity: Not on file  ?Other Topics Concern  ? Not on file  ?Social History Narrative  ? Married and retired  ? Lives in [place], [place]  ? No hx EtOH/tobacco/drugs  ? ?Social Determinants of Health  ? ?Financial Resource Strain: Not on file  ?Food Insecurity: Not on file  ?Transportation Needs: Not on file  ?Physical Activity: Not on file  ?Stress: Not on file  ?Social Connections: Not on file  ? ?Outpatient Encounter Medications as of 11/04/2021  ?Medication Sig  ? [DISCONTINUED] glipiZIDE (GLUCOTROL XL) 5 MG 24 hr tablet TAKE 1 TABLET BY MOUTH EVERY DAY WITH BREAKFAST (Patient taking  differently: Pt takes 1 tablet every 2-3 days)  ? albuterol (PROVENTIL HFA;VENTOLIN HFA) 108 (90 Base) MCG/ACT inhaler Inhale 2 puffs into the lungs every 6 (six) hours as needed for wheezing or shortness of breath.  ? B-D ULTRAFINE III SHORT PEN 31G X 8 MM MISC 1 EACH BY OTHER ROUTE SEE ADMIN INSTRUCTIONS. USE ONE PEN NEEDLE FOUR TIMES DAILY TO INJECT INSULIN  ? Continuous Blood Gluc Receiver (FREESTYLE LIBRE 2 READER) DEVI AS DIRECTED  ? Continuous Blood Gluc Sensor (FREESTYLE LIBRE 2 SENSOR) MISC USE 1 PIECE BY DOES NOT APPLY ROUTE EVERY 14 (FOURTEEN) DAYS  ? cyclobenzaprine (FLEXERIL) 10 MG tablet Take 1 tablet (10 mg total) by mouth 2 (two) times daily as needed for muscle spasms.  ? esomeprazole (NEXIUM) 40 MG capsule Take 40 mg by mouth daily.  ? Ferrous Fumarate (HEMOCYTE - 106 MG FE) 324 (106 Fe) MG TABS tablet Take 1 tablet by mouth.  ? fluticasone (FLONASE) 50 MCG/ACT nasal spray   ? furosemide (LASIX) 40 MG tablet Take 20 mg by mouth 2 (two) times daily.  ? gabapentin (NEURONTIN) 300 MG capsule Take 300 mg by mouth at bedtime. 2 tablets at bedtime  ? glipiZIDE (GLUCOTROL XL)  5 MG 24 hr tablet Take 1 tablet (5 mg total) by mouth daily with breakfast.  ? Insulin Glargine (BASAGLAR KWIKPEN) 100 UNIT/ML INJECT 80 UNITS INTO THE SKIN AT BEDTIME.  ? losartan-hydrochlorothiazide (HYZAAR) 50-12.5 MG tablet Take 1 tablet by mouth daily.  ? NON FORMULARY CPAP  ? NOVOLOG FLEXPEN 100 UNIT/ML FlexPen INJECT 25-31 UNITS INTO THE SKIN 3 (THREE) TIMES DAILY WITH MEALS.  ? PARoxetine (PAXIL) 10 MG tablet Take 10 mg by mouth daily.  ? simvastatin (ZOCOR) 40 MG tablet Take 40 mg by mouth daily.  ? sitaGLIPtin (JANUVIA) 50 MG tablet Take 1 tablet (50 mg total) by mouth daily.  ? Vitamin D, Ergocalciferol, (DRISDOL) 50000 units CAPS capsule Take 50,000 Units by mouth every 14 (fourteen) days.  ? ?No facility-administered encounter medications on file as of 11/04/2021.  ? ?ALLERGIES: ?Allergies  ?Allergen Reactions  ? Biaxin  [Clarithromycin] Hives  ? ?VACCINATION STATUS: ? ?There is no immunization history on file for this patient. ? ?Diabetes ?She presents for her follow-up diabetic visit. She has type 2 diabetes mellitus. Onset time: She was diagnosed at approximate age of 14 years. Her disease course has been improving. There are no hypoglycemic associated symptoms. Pertinent negatives for hypoglycemia include no confusion, headaches, pallor or seizures. Associated symptoms include fatigue. Pertinent negatives for diabetes include no blurred vision, no chest pain, no polydipsia, no polyphagia and no polyuria. There are no hypoglycemic complications. Symptoms are improving. Risk factors for coronary artery disease include diabetes mellitus, dyslipidemia, family history, hypertension, obesity and sedentary lifestyle. Current diabetic treatment includes insulin injections and oral agent (dual therapy). Her weight is increasing steadily. She is following a generally unhealthy diet. When asked about meal planning, she reported none. She has had a previous visit with a dietitian. She never participates in exercise. Her home blood glucose trend is decreasing steadily. Her breakfast blood glucose range is generally 140-180 mg/dl. Her lunch blood glucose range is generally 140-180 mg/dl. Her dinner blood glucose range is generally 140-180 mg/dl. Her bedtime blood glucose range is generally 140-180 mg/dl. Her overall blood glucose range is 140-180 mg/dl. (Tiffany Brock presents with improved glycemic profile and point-of-care A1c of 7.6%.  Her AGP report shows 53% time range, 39% level 1 hypoglycemia, 8% level 2 hyperglycemia.  0% hypoglycemia.   ?  ? ? ?) An ACE inhibitor/angiotensin II receptor blocker is being taken. She sees a podiatrist.Eye exam is current.  ?Hyperlipidemia ?This is a chronic problem. The current episode started more than 1 year ago. The problem is controlled. Exacerbating diseases include diabetes and obesity. Pertinent  negatives include no chest pain, myalgias or shortness of breath. Current antihyperlipidemic treatment includes statins. Risk factors for coronary artery disease include diabetes mellitus, dyslipidemia, family history, obesity, hypertension and a sedentary lifestyle.  ?Hypertension ?This is a chronic problem. The current episode started more than 1 year ago. Pertinent negatives include no blurred vision, chest pain, headaches, palpitations or shortness of breath. Risk factors for coronary artery disease include dyslipidemia, diabetes mellitus, family history, obesity and sedentary lifestyle. Past treatments include angiotensin blockers.  ? ?Review of systems ? ?Constitutional: + Minimally fluctuating body weight,  current  Body mass index is 44.6 kg/m?. , + fatigue, no subjective hyperthermia, no subjective hypothermia ? ? ? ?Objective:  ?  ?BP 126/66   Pulse 72   Ht 5\' 5"  (1.651 m)   Wt 268 lb (121.6 kg)   BMI 44.60 kg/m?   ?Wt Readings from Last 3 Encounters:  ?11/04/21  268 lb (121.6 kg)  ?07/07/21 262 lb 12.8 oz (119.2 kg)  ?02/27/21 261 lb (118.4 kg)  ?  ? ?CMP  ?   ?Component Value Date/Time  ? NA 133 (L) 06/12/2021 1452  ? NA 141 11/15/2020 0000  ? K 4.3 06/30/2021 0000  ? CL 104 06/30/2021 0000  ? CO2 31 (A) 06/30/2021 0000  ? GLUCOSE 134 (H) 06/12/2021 1452  ? BUN 21 06/30/2021 0000  ? CREATININE 1.4 (A) 06/30/2021 0000  ? CREATININE 1.33 (H) 06/12/2021 1452  ? CALCIUM 9.1 06/30/2021 0000  ? ALBUMIN 3.3 (A) 06/30/2021 0000  ? AST 19 11/15/2020 0000  ? ALT 19 11/15/2020 0000  ? ALKPHOS 70 11/15/2020 0000  ? GFRNONAA 42 (L) 06/12/2021 1452  ? GFRAA 51 11/15/2020 0000  ? ?Diabetic Labs (most recent): ?Lab Results  ?Component Value Date  ? HGBA1C 7.6 (A) 11/04/2021  ? HGBA1C 8.0 (A) 07/07/2021  ? HGBA1C 8.3 (A) 11/22/2020  ? ? ?Lipid Panel  ?   ?Component Value Date/Time  ? CHOL 140 06/30/2021 0000  ? TRIG 103 06/30/2021 0000  ? HDL 55 06/30/2021 0000  ? [place] 64 06/30/2021 0000  ? ?  ?Assessment & Plan:   ? ?1. 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 ?- Patient has currently uncontrolled symptomatic type 2 DM since  76 years of age.

## 2021-11-04 NOTE — Patient Instructions (Signed)

## 2021-11-06 ENCOUNTER — Telehealth: Payer: Self-pay | Admitting: "Endocrinology

## 2021-11-06 NOTE — Telephone Encounter (Signed)
Left a message requesting pt return call to the office. ?

## 2021-11-06 NOTE — Telephone Encounter (Signed)
Pt called and states she knows many people on Ozempic that has had good results. She would like to know if Dr Fransico Him will prescribe this for her for her diabetes and weight.

## 2021-11-07 NOTE — Telephone Encounter (Signed)
Discussed with pt, understanding voiced. 

## 2021-11-07 NOTE — Telephone Encounter (Signed)
Left a message requesting pt to return call to the office. 

## 2021-11-07 NOTE — Telephone Encounter (Signed)
Pt returning your call

## 2021-12-01 ENCOUNTER — Other Ambulatory Visit: Payer: Self-pay | Admitting: "Endocrinology

## 2021-12-01 DIAGNOSIS — E1122 Type 2 diabetes mellitus with diabetic chronic kidney disease: Secondary | ICD-10-CM

## 2021-12-16 ENCOUNTER — Other Ambulatory Visit: Payer: Self-pay | Admitting: "Endocrinology

## 2022-01-29 LAB — HEPATIC FUNCTION PANEL
ALT: 20 U/L (ref 7–35)
AST: 17 (ref 13–35)
Alkaline Phosphatase: 74 (ref 25–125)
Bilirubin, Total: 0.6

## 2022-01-29 LAB — BASIC METABOLIC PANEL
BUN: 18 (ref 4–21)
CO2: 28 — AB (ref 13–22)
Chloride: 105 (ref 99–108)
Creatinine: 1.5 — AB (ref 0.5–1.1)
Glucose: 178
Potassium: 4.1 mEq/L (ref 3.5–5.1)
Sodium: 140 (ref 137–147)

## 2022-01-29 LAB — COMPREHENSIVE METABOLIC PANEL
Albumin: 3 — AB (ref 3.5–5.0)
Calcium: 8.6 — AB (ref 8.7–10.7)

## 2022-01-29 LAB — LIPID PANEL
Cholesterol: 131 (ref 0–200)
HDL: 43 (ref 35–70)
LDL Cholesterol: 66
Triglycerides: 108 (ref 40–160)

## 2022-01-29 LAB — TSH: TSH: 2.41 (ref 0.41–5.90)

## 2022-02-05 ENCOUNTER — Ambulatory Visit: Payer: Medicare Other | Admitting: "Endocrinology

## 2022-02-05 ENCOUNTER — Encounter: Payer: Self-pay | Admitting: "Endocrinology

## 2022-02-05 VITALS — BP 132/66 | HR 68 | Ht 65.0 in | Wt 264.4 lb

## 2022-02-05 DIAGNOSIS — I10 Essential (primary) hypertension: Secondary | ICD-10-CM | POA: Diagnosis not present

## 2022-02-05 DIAGNOSIS — E782 Mixed hyperlipidemia: Secondary | ICD-10-CM | POA: Diagnosis not present

## 2022-02-05 DIAGNOSIS — N183 Chronic kidney disease, stage 3 unspecified: Secondary | ICD-10-CM

## 2022-02-05 DIAGNOSIS — E038 Other specified hypothyroidism: Secondary | ICD-10-CM | POA: Diagnosis not present

## 2022-02-05 DIAGNOSIS — E1122 Type 2 diabetes mellitus with diabetic chronic kidney disease: Secondary | ICD-10-CM

## 2022-02-05 DIAGNOSIS — Z794 Long term (current) use of insulin: Secondary | ICD-10-CM | POA: Diagnosis not present

## 2022-02-05 LAB — POCT GLYCOSYLATED HEMOGLOBIN (HGB A1C): HbA1c, POC (controlled diabetic range): 7.8 % — AB (ref 0.0–7.0)

## 2022-02-05 NOTE — Progress Notes (Signed)
02/05/2022  Endocrinology follow-up note   Subjective:    Patient ID: Tiffany Brock, female    DOB: 1945-10-26.  She is here for follow-up  for management of type 2 diabetes, hyperlipidemia, hypertension.  PCP: Richarda Blade E  Past Medical History:  Diagnosis Date   Anemia    Arthritis    Diabetes mellitus, type II (HCC)    Dyspnea    on exertion   Headache    Hyperlipidemia    Hypertension    Iron deficiency anemia with multiple negative GI work-ups question AVMs 06/28/2019   Pneumonia    h/o   PONV (postoperative nausea and vomiting)    Sleep apnea    Vertigo    Past Surgical History:  Procedure Laterality Date   ABDOMINAL HYSTERECTOMY     APPENDECTOMY     BLEPHAROPLASTY Bilateral 01/2018   CARPAL TUNNEL RELEASE Left 2008   COLONOSCOPY     multiple   ESOPHAGOGASTRODUODENOSCOPY     multiple   GIVENS CAPSULE STUDY     HEMORROIDECTOMY     UMBILICAL HERNIA REPAIR     Social History   Socioeconomic History   Marital status: Married    Spouse name: Not on file   Number of children: Not on file   Years of education: Not on file   Highest education level: Not on file  Occupational History   Not on file  Tobacco Use   Smoking status: Never   Smokeless tobacco: Never  Vaping Use   Vaping Use: Never used  Substance and Sexual Activity   Alcohol use: No   Drug use: No   Sexual activity: Not on file  Other Topics Concern   Not on file  Social History Narrative   Married and retired   Lives in [place], [place]   No hx EtOH/tobacco/drugs   Social Determinants of Health   Financial Resource Strain: Not on file  Food Insecurity: Not on file  Transportation Needs: Not on file  Physical Activity: Not on file  Stress: Not on file  Social Connections: Not on file   Outpatient Encounter Medications as of 02/05/2022  Medication Sig   albuterol (PROVENTIL HFA;VENTOLIN HFA) 108 (90 Base) MCG/ACT inhaler Inhale 2 puffs into the lungs every 6 (six) hours as  needed for wheezing or shortness of breath.   B-D ULTRAFINE III SHORT PEN 31G X 8 MM MISC 1 EACH BY OTHER ROUTE SEE ADMIN INSTRUCTIONS. USE ONE PEN NEEDLE FOUR TIMES DAILY TO INJECT INSULIN   Continuous Blood Gluc Receiver (FREESTYLE LIBRE 2 READER) DEVI AS DIRECTED   Continuous Blood Gluc Sensor (FREESTYLE LIBRE 2 SENSOR) MISC USE 1 PIECE BY DOES NOT APPLY ROUTE EVERY 14 (FOURTEEN) DAYS   cyclobenzaprine (FLEXERIL) 10 MG tablet Take 1 tablet (10 mg total) by mouth 2 (two) times daily as needed for muscle spasms.   esomeprazole (NEXIUM) 40 MG capsule Take 40 mg by mouth daily.   Ferrous Fumarate (HEMOCYTE - 106 MG FE) 324 (106 Fe) MG TABS tablet Take 1 tablet by mouth.   fluticasone (FLONASE) 50 MCG/ACT nasal spray    furosemide (LASIX) 40 MG tablet Take 20 mg by mouth 2 (two) times daily.   gabapentin (NEURONTIN) 300 MG capsule Take 300 mg by mouth at bedtime. 2 tablets at bedtime   glipiZIDE (GLUCOTROL XL) 5 MG 24 hr tablet Take 1 tablet (5 mg total) by mouth daily with breakfast.   insulin aspart (NOVOLOG FLEXPEN) 100 UNIT/ML FlexPen INJECT 20- 26 UNITS INTO  THE SKIN 3 (THREE) TIMES DAILY WITH MEALS.   Insulin Glargine (BASAGLAR KWIKPEN) 100 UNIT/ML INJECT 80 UNITS INTO THE SKIN AT BEDTIME.   losartan-hydrochlorothiazide (HYZAAR) 50-12.5 MG tablet Take 1 tablet by mouth daily.   NON FORMULARY CPAP   PARoxetine (PAXIL) 10 MG tablet Take 10 mg by mouth daily.   simvastatin (ZOCOR) 40 MG tablet Take 40 mg by mouth daily.   sitaGLIPtin (JANUVIA) 50 MG tablet Take 1 tablet (50 mg total) by mouth daily.   Vitamin D, Ergocalciferol, (DRISDOL) 50000 units CAPS capsule Take 50,000 Units by mouth every 14 (fourteen) days.   No facility-administered encounter medications on file as of 02/05/2022.   ALLERGIES: Allergies  Allergen Reactions   Biaxin [Clarithromycin] Hives   VACCINATION STATUS:  There is no immunization history on file for this patient.  Diabetes She presents for her follow-up  diabetic visit. She has type 2 diabetes mellitus. Onset time: She was diagnosed at approximate age of 46 years. Her disease course has been fluctuating. There are no hypoglycemic associated symptoms. Pertinent negatives for hypoglycemia include no confusion, headaches, pallor or seizures. Associated symptoms include fatigue. Pertinent negatives for diabetes include no blurred vision, no chest pain, no polydipsia, no polyphagia and no polyuria. There are no hypoglycemic complications. Symptoms are improving. Risk factors for coronary artery disease include diabetes mellitus, dyslipidemia, family history, hypertension, obesity and sedentary lifestyle. Current diabetic treatment includes insulin injections and oral agent (dual therapy). Her weight is fluctuating minimally. She is following a generally unhealthy diet. When asked about meal planning, she reported none. She has had a previous visit with a dietitian. She never participates in exercise. Her home blood glucose trend is increasing steadily. Her breakfast blood glucose range is generally 140-180 mg/dl. Her lunch blood glucose range is generally 140-180 mg/dl. Her dinner blood glucose range is generally 140-180 mg/dl. Her bedtime blood glucose range is generally 140-180 mg/dl. Her overall blood glucose range is 140-180 mg/dl. ( Tiffany Brock presents with her CGM device and showing 46% time range, 40% level 1 hyperglycemia, 12%  level 2 hyperglycemic , no significant hypoglycemia.  Her point-of-care A1c is 7.8%.  Probably blood glucose is 185 over the last 14 days.   ) An ACE inhibitor/angiotensin II receptor blocker is being taken. She sees a podiatrist.Eye exam is current.  Hyperlipidemia This is a chronic problem. The current episode started more than 1 year ago. The problem is controlled. Exacerbating diseases include diabetes and obesity. Pertinent negatives include no chest pain, myalgias or shortness of breath. Current antihyperlipidemic treatment  includes statins. Risk factors for coronary artery disease include diabetes mellitus, dyslipidemia, family history, obesity, hypertension and a sedentary lifestyle.  Hypertension This is a chronic problem. The current episode started more than 1 year ago. Pertinent negatives include no blurred vision, chest pain, headaches, palpitations or shortness of breath. Risk factors for coronary artery disease include dyslipidemia, diabetes mellitus, family history, obesity and sedentary lifestyle. Past treatments include angiotensin blockers.    Review of systems  Constitutional: + Minimally fluctuating body weight,  current  Body mass index is 44 kg/m. , + fatigue, no subjective hyperthermia, no subjective hypothermia    Objective:    BP 132/66   Pulse 68   Ht 5\' 5"  (1.651 m)   Wt 264 lb 6.4 oz (119.9 kg)   BMI 44.00 kg/m   Wt Readings from Last 3 Encounters:  02/05/22 264 lb 6.4 oz (119.9 kg)  11/04/21 268 lb (121.6 kg)  07/07/21 262 lb  12.8 oz (119.2 kg)     CMP     Component Value Date/Time   NA 140 01/29/2022 0000   K 4.1 01/29/2022 0000   CL 105 01/29/2022 0000   CO2 28 (A) 01/29/2022 0000   GLUCOSE 134 (H) 06/12/2021 1452   BUN 18 01/29/2022 0000   CREATININE 1.5 (A) 01/29/2022 0000   CREATININE 1.33 (H) 06/12/2021 1452   CALCIUM 8.6 (A) 01/29/2022 0000   ALBUMIN 3.0 (A) 01/29/2022 0000   AST 17 01/29/2022 0000   ALT 20 01/29/2022 0000   ALKPHOS 74 01/29/2022 0000   GFRNONAA 42 (L) 06/12/2021 1452   GFRAA 51 11/15/2020 0000   Diabetic Labs (most recent): Lab Results  Component Value Date   HGBA1C 7.8 (A) 02/05/2022   HGBA1C 7.6 (A) 11/04/2021   HGBA1C 8.0 (A) 07/07/2021   MICROALBUR 0.7 03/31/2018    Lipid Panel     Component Value Date/Time   CHOL 131 01/29/2022 0000   TRIG 108 01/29/2022 0000   HDL 43 01/29/2022 0000   LDLCALC 66 01/29/2022 0000     Assessment & Plan:   1. 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  Tiffany Brock presents with her CGM device and showing 46% time range, 40% level 1 hyperglycemia, 12%  level 2 hyperglycemic , no significant hypoglycemia.  Her point-of-care A1c is 7.8%.  Probably blood glucose is 185 over the last 14 days.    Her diabetes is complicated by  CKD, obesity , and sedentary life and patient remains at a high risk for more acute and chronic complications of diabetes which include CAD, CVA, CKD, retinopathy, and neuropathy. These are all discussed in detail with the patient.  - I have counseled the patient on diet management and weight loss, by adopting a carbohydrate restricted/protein rich diet.   - she acknowledges that there is a room for improvement in her food and drink choices. - Suggestion is made for her to avoid simple carbohydrates  from her diet including Cakes, Sweet Desserts, Ice Cream, Soda (diet and regular), Sweet Tea, Candies, Chips, Cookies, Store Bought Juices, Alcohol in Excess of  1-2 drinks a day, Artificial Sweeteners,  Coffee Creamer, and "Sugar-free" Products, Lemonade. This will help patient to have more stable blood glucose profile and potentially avoid unintended weight gain.   - I encouraged the patient to switch to unprocessed or minimally processed complex starch and increased protein intake (animal or plant source), fruits, and vegetables.  - Patient is advised to stick to a routine mealtimes to eat 3 meals  a day and avoid unnecessary snacks ( to snack only to correct hypoglycemia).   - I have approached patient with the following individualized plan to manage diabetes and patient agrees:   -In light of her presentation with the above target glycemic profile, she would continue to need basal/bolus insulin in order for her to achieve and maintain control of diabetes to target.    -Accordingly, she is  advised to continue Basaglar 80 units nightly, continue with NovoLog 20 units 3 times a day before meals for pre-meal blood glucose above 90 mg/dL associated with strict monitoring of glucose 4 times a day-before meals and at bedtime. She is benefiting and advised to use her CGM at all times.  -Patient is encouraged to call clinic for blood glucose levels  less than 70 or above 200 mg /dl.   -She is advised to finish her supplies of Januvia 50 mg p.o. daily at breakfast, after that there will be switched to Rybelsus 7 mg p.o. daily.       -Her renal function is still abnormal, however stable.  She is advised to continue follow-up with nephrology.  She is off of Metformin for now. She is benefiting from low-dose glipizide from the standpoint of glycemic control.  She is advised to continue glipizide 5 mg XL p.o. daily at breakfast.    - Patient specific target  A1c;  LDL, HDL, Triglycerides, were discussed in detail.  2) BP/HTN:  -Her blood pressure is controlled to target.   -She did get her blood pressure medications adjusted by her nephrologist currently on furosemide 40 mg p.o. daily, spironolactone 50 mg p.o. daily, as well as Hyzaar 50/12.5 mg p.o. daily.    3) Lipids/HPL: Her recent lipid panel showed controlled LDL at 68.  She is advised to continue simvastatin 40 mg p.o. nightly.    Side effects and precautions discussed with her.  4) subclinical hypothyroidism: Resolved.  Her subsequent full set of thyroid function tests are within normal limits.   She will not need intervention at this time.  She will need repeat thyroid function test after her next visit.    5) obesity: Her BMI is [id number]  - clearly complicating her diabetes care.  She is a candidate for modest weight loss.   Whole food plant-based diet was discussed and recommended to her.  6) Chronic Care/Health Maintenance:  -Patient is on ARB and Statin medications and encouraged to continue to follow up with Ophthalmology,  Podiatrist at least yearly or according to recommendations, and advised to   stay away from smoking. I have recommended yearly flu vaccine and pneumonia vaccination at least every 5 years; and  sleep for at least 7 hours a day.   Patient cannot exercise optimally.    - I advised patient to maintain close follow up with Ephriam Jenkins E for primary care needs.  I spent 41 minutes in the care of the patient today including review of labs from [facility], Lipids, Thyroid Function, Hematology (current and previous including abstractions from other facilities); face-to-face time discussing  her blood glucose readings/logs, discussing hypoglycemia and hyperglycemia episodes and symptoms, medications doses, her options of short and long term treatment based on the latest standards of care / guidelines;  discussion about incorporating lifestyle medicine;  and documenting the encounter. Risk reduction counseling performed per USPSTF guidelines to reduce  obesity and cardiovascular risk factors.     Please refer to Patient Instructions for Blood Glucose Monitoring and Insulin/Medications Dosing Guide"  in media tab for additional information. Please  also refer to " Patient Self Inventory" in the Media  tab for reviewed elements of pertinent patient history.  Sherrion Cluff Mahar participated in the discussions, expressed understanding, and voiced agreement with the above plans.  All questions were answered to her satisfaction. she is encouraged to contact clinic should she have any questions or concerns prior to her return visit.    Follow up plan: - Return in about 3 months (around 05/08/2022) for Bring Meter/CGM Device/Logs- A1c in Office.  Glade Lloyd, MD Phone: [phone]  Fax: [phone]  This note was partially dictated with voice recognition software. Similar sounding words can be transcribed inadequately or may not  be corrected upon review.  02/05/2022, 5:17 PM

## 2022-02-05 NOTE — Patient Instructions (Signed)

## 2022-02-24 ENCOUNTER — Other Ambulatory Visit: Payer: Self-pay | Admitting: "Endocrinology

## 2022-02-24 DIAGNOSIS — E1122 Type 2 diabetes mellitus with diabetic chronic kidney disease: Secondary | ICD-10-CM

## 2022-02-25 ENCOUNTER — Other Ambulatory Visit: Payer: Self-pay | Admitting: Nurse Practitioner

## 2022-02-25 DIAGNOSIS — E1122 Type 2 diabetes mellitus with diabetic chronic kidney disease: Secondary | ICD-10-CM

## 2022-03-31 ENCOUNTER — Telehealth: Payer: Self-pay | Admitting: "Endocrinology

## 2022-03-31 NOTE — Telephone Encounter (Signed)
New message    The patient has finished taken  sitaGLIPtin (JANUVIA) 50 MG tablet.   2.Does patient needs to continue taken glipiZIDE (GLUCOTROL XL) 5 MG 24 hr tablet.  Asking for a call back to discuss.

## 2022-04-01 ENCOUNTER — Other Ambulatory Visit: Payer: Self-pay | Admitting: "Endocrinology

## 2022-04-01 MED ORDER — RYBELSUS 7 MG PO TABS: 7.0000 mg | ORAL_TABLET | Freq: Every day | ORAL | 3 refills | Status: DC

## 2022-04-01 NOTE — Telephone Encounter (Signed)
Discussed with pt, understanding voiced. 

## 2022-05-08 ENCOUNTER — Ambulatory Visit: Payer: Medicare Other | Admitting: "Endocrinology

## 2022-06-03 ENCOUNTER — Encounter: Payer: Self-pay | Admitting: "Endocrinology

## 2022-06-03 ENCOUNTER — Ambulatory Visit (INDEPENDENT_AMBULATORY_CARE_PROVIDER_SITE_OTHER): Payer: Medicare Other | Admitting: "Endocrinology

## 2022-06-03 VITALS — BP 106/64 | HR 68 | Ht 65.0 in | Wt 260.2 lb

## 2022-06-03 DIAGNOSIS — Z794 Long term (current) use of insulin: Secondary | ICD-10-CM

## 2022-06-03 DIAGNOSIS — E038 Other specified hypothyroidism: Secondary | ICD-10-CM

## 2022-06-03 DIAGNOSIS — E1122 Type 2 diabetes mellitus with diabetic chronic kidney disease: Secondary | ICD-10-CM

## 2022-06-03 DIAGNOSIS — N183 Chronic kidney disease, stage 3 unspecified: Secondary | ICD-10-CM

## 2022-06-03 DIAGNOSIS — E782 Mixed hyperlipidemia: Secondary | ICD-10-CM | POA: Diagnosis not present

## 2022-06-03 DIAGNOSIS — I10 Essential (primary) hypertension: Secondary | ICD-10-CM | POA: Diagnosis not present

## 2022-06-03 LAB — POCT GLYCOSYLATED HEMOGLOBIN (HGB A1C): HbA1c, POC (controlled diabetic range): 7.9 % — AB (ref 0.0–7.0)

## 2022-06-03 NOTE — Patient Instructions (Signed)

## 2022-06-03 NOTE — Progress Notes (Signed)
06/03/2022  Endocrinology follow-up note   Subjective:    Patient ID: Tiffany Brock, female    DOB: 1946/03/31.  She is here for follow-up  for management of type 2 diabetes, hyperlipidemia, hypertension.  PCP: Ephriam Jenkins E  Past Medical History:  Diagnosis Date   Anemia    Arthritis    Diabetes mellitus, type II (HCC)    Dyspnea    on exertion   Headache    Hyperlipidemia    Hypertension    Iron deficiency anemia with multiple negative GI work-ups question AVMs 06/28/2019   Pneumonia    h/o   PONV (postoperative nausea and vomiting)    Sleep apnea    Vertigo    Past Surgical History:  Procedure Laterality Date   ABDOMINAL HYSTERECTOMY     APPENDECTOMY     BLEPHAROPLASTY Bilateral 01/2018   CARPAL TUNNEL RELEASE Left 2008   COLONOSCOPY     multiple   ESOPHAGOGASTRODUODENOSCOPY     multiple   GIVENS CAPSULE STUDY     HEMORROIDECTOMY     UMBILICAL HERNIA REPAIR     Social History   Socioeconomic History   Marital status: Married    Spouse name: Not on file   Number of children: Not on file   Years of education: Not on file   Highest education level: Not on file  Occupational History   Not on file  Tobacco Use   Smoking status: Never   Smokeless tobacco: Never  Vaping Use   Vaping Use: Never used  Substance and Sexual Activity   Alcohol use: No   Drug use: No   Sexual activity: Not on file  Other Topics Concern   Not on file  Social History Narrative   Married and retired   Lives in [place], [place]   No hx EtOH/tobacco/drugs   Social Determinants of Health   Financial Resource Strain: Not on file  Food Insecurity: Not on file  Transportation Needs: Not on file  Physical Activity: Not on file  Stress: Not on file  Social Connections: Not on file   Outpatient Encounter Medications as of 06/03/2022  Medication Sig   albuterol (PROVENTIL HFA;VENTOLIN HFA) 108 (90 Base) MCG/ACT inhaler Inhale 2 puffs into the lungs every 6 (six) hours as  needed for wheezing or shortness of breath.   B-D ULTRAFINE III SHORT PEN 31G X 8 MM MISC 1 EACH BY OTHER ROUTE SEE ADMIN INSTRUCTIONS. USE ONE PEN NEEDLE FOUR TIMES DAILY TO INJECT INSULIN   Continuous Blood Gluc Receiver (FREESTYLE LIBRE 2 READER) DEVI AS DIRECTED   Continuous Blood Gluc Sensor (FREESTYLE LIBRE 2 SENSOR) MISC USE 1 PIECE BY DOES NOT APPLY ROUTE EVERY 14 (FOURTEEN) DAYS   cyclobenzaprine (FLEXERIL) 10 MG tablet Take 1 tablet (10 mg total) by mouth 2 (two) times daily as needed for muscle spasms.   esomeprazole (NEXIUM) 40 MG capsule Take 40 mg by mouth daily.   Ferrous Fumarate (HEMOCYTE - 106 MG FE) 324 (106 Fe) MG TABS tablet Take 1 tablet by mouth.   fluticasone (FLONASE) 50 MCG/ACT nasal spray    furosemide (LASIX) 40 MG tablet Take 20 mg by mouth 2 (two) times daily.   gabapentin (NEURONTIN) 300 MG capsule Take 300 mg by mouth at bedtime. 2 tablets at bedtime   insulin aspart (NOVOLOG FLEXPEN) 100 UNIT/ML FlexPen INJECT 20- 26 UNITS INTO THE SKIN 3 (THREE) TIMES DAILY WITH MEALS.   insulin degludec (TRESIBA FLEXTOUCH) 100 UNIT/ML FlexTouch Pen Inject 80 Units  into the skin at bedtime.   losartan-hydrochlorothiazide (HYZAAR) 50-12.5 MG tablet Take 1 tablet by mouth daily.   NON FORMULARY CPAP   PARoxetine (PAXIL) 10 MG tablet Take 10 mg by mouth daily.   Semaglutide (RYBELSUS) 7 MG TABS Take 7 mg by mouth daily before breakfast.   simvastatin (ZOCOR) 40 MG tablet Take 40 mg by mouth daily.   Vitamin D, Ergocalciferol, (DRISDOL) 50000 units CAPS capsule Take 50,000 Units by mouth every 14 (fourteen) days.   [DISCONTINUED] glipiZIDE (GLUCOTROL XL) 5 MG 24 hr tablet Take 1 tablet (5 mg total) by mouth daily with breakfast.   No facility-administered encounter medications on file as of 06/03/2022.   ALLERGIES: Allergies  Allergen Reactions   Biaxin [Clarithromycin] Hives   VACCINATION STATUS: Immunization History  Administered Date(s) Administered   Moderna  Sars-Covid-2 Vaccination 08/19/2019, 09/16/2019    Diabetes She presents for her follow-up diabetic visit. She has type 2 diabetes mellitus. Onset time: She was diagnosed at approximate age of 11 years. Her disease course has been fluctuating. There are no hypoglycemic associated symptoms. Pertinent negatives for hypoglycemia include no confusion, headaches, pallor or seizures. Associated symptoms include fatigue. Pertinent negatives for diabetes include no blurred vision, no chest pain, no polydipsia, no polyphagia and no polyuria. There are no hypoglycemic complications. Symptoms are improving. Risk factors for coronary artery disease include diabetes mellitus, dyslipidemia, family history, hypertension, obesity and sedentary lifestyle. Current diabetic treatment includes insulin injections and oral agent (dual therapy). Her weight is fluctuating minimally. She is following a generally unhealthy diet. When asked about meal planning, she reported none. She has had a previous visit with a dietitian. She never participates in exercise. Her home blood glucose trend is fluctuating minimally. Her breakfast blood glucose range is generally 140-180 mg/dl. Her lunch blood glucose range is generally 140-180 mg/dl. Her dinner blood glucose range is generally 140-180 mg/dl. Her bedtime blood glucose range is generally 140-180 mg/dl. Her overall blood glucose range is 140-180 mg/dl. ( Tiffany Brock presents with her CGM device and showing 39% time range, 41% level 1 hyperglycemia, 19%  level 2 hyperglycemic , no significant hypoglycemia.  Her point-of-care A1c is 7.9%.  Her average blood glucose is 194 for the last 14 days.    ) An ACE inhibitor/angiotensin II receptor blocker is being taken. She sees a podiatrist.Eye exam is current.  Hyperlipidemia This is a chronic problem. The current episode started more than 1 year ago. The problem is controlled. Exacerbating diseases include diabetes and obesity. Pertinent  negatives include no chest pain, myalgias or shortness of breath. Current antihyperlipidemic treatment includes statins. Risk factors for coronary artery disease include diabetes mellitus, dyslipidemia, family history, obesity, hypertension and a sedentary lifestyle.  Hypertension This is a chronic problem. The current episode started more than 1 year ago. Pertinent negatives include no blurred vision, chest pain, headaches, palpitations or shortness of breath. Risk factors for coronary artery disease include dyslipidemia, diabetes mellitus, family history, obesity and sedentary lifestyle. Past treatments include angiotensin blockers.    Review of systems  Constitutional: + Minimally fluctuating body weight,  current  Body mass index is 43.3 kg/m. , + fatigue, no subjective hyperthermia, no subjective hypothermia    Objective:    BP 106/64   Pulse 68   Ht 5\' 5"  (1.651 m)   Wt 260 lb 3.2 oz (118 kg)   BMI 43.30 kg/m   Wt Readings from Last 3 Encounters:  06/03/22 260 lb 3.2 oz (118 kg)  02/05/22  264 lb 6.4 oz (119.9 kg)  11/04/21 268 lb (121.6 kg)     CMP     Component Value Date/Time   NA 140 01/29/2022 0000   K 4.1 01/29/2022 0000   CL 105 01/29/2022 0000   CO2 28 (A) 01/29/2022 0000   GLUCOSE 134 (H) 06/12/2021 1452   BUN 18 01/29/2022 0000   CREATININE 1.5 (A) 01/29/2022 0000   CREATININE 1.33 (H) 06/12/2021 1452   CALCIUM 8.6 (A) 01/29/2022 0000   ALBUMIN 3.0 (A) 01/29/2022 0000   AST 17 01/29/2022 0000   ALT 20 01/29/2022 0000   ALKPHOS 74 01/29/2022 0000   GFRNONAA 42 (L) 06/12/2021 1452   GFRAA 51 11/15/2020 0000   Diabetic Labs (most recent): Lab Results  Component Value Date   HGBA1C 7.9 (A) 06/03/2022   HGBA1C 7.8 (A) 02/05/2022   HGBA1C 7.6 (A) 11/04/2021   MICROALBUR 0.7 03/31/2018    Lipid Panel     Component Value Date/Time   CHOL 131 01/29/2022 0000   TRIG 108 01/29/2022 0000   HDL 43 01/29/2022 0000   LDLCALC 66 01/29/2022 0000      Assessment & Plan:   1. 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  Tiffany Brock presents with her CGM device and showing 39% time range, 41% level 1 hyperglycemia, 19%  level 2 hyperglycemic , no significant hypoglycemia.  Her point-of-care A1c is 7.9%.  Her average blood glucose is 194 for the last 14 days.       Her diabetes is complicated by  CKD, obesity , and sedentary life and patient remains at a high risk for more acute and chronic complications of diabetes which include CAD, CVA, CKD, retinopathy, and neuropathy. These are all discussed in detail with the patient.  - I have counseled the patient on diet management and weight loss, by adopting a carbohydrate restricted/protein rich diet.  - she acknowledges that there is a room for improvement in her food and drink choices. - Suggestion is made for her to avoid simple carbohydrates  from her diet including Cakes, Sweet Desserts, Ice Cream, Soda (diet and regular), Sweet Tea, Candies, Chips, Cookies, Store Bought Juices, Alcohol in Excess of  1-2 drinks a day, Artificial Sweeteners,  Coffee Creamer, and "Sugar-free" Products, Lemonade. This will help patient to have more stable blood glucose profile and potentially avoid unintended weight gain.   - I encouraged the patient to switch to unprocessed or minimally processed complex starch and increased protein intake (animal or plant source), fruits, and vegetables.  - Patient is advised to stick to a routine mealtimes to eat 3 meals  a day and avoid unnecessary snacks ( to snack only to correct hypoglycemia).   - I have approached patient with the following individualized plan to manage diabetes and patient agrees:   -In light of her presentation with the above target glycemic profile, she will continue to need basal/bolus  insulin in order for her to maintain control of diabetes to target.   -Accordingly, she is advised to continue Tresiba  80 units nightly, continue with NovoLog 20 units 3 times a day before meals for pre-meal blood glucose above 90 mg/dL associated with strict monitoring of glucose 4 times a day-before meals and at bedtime. She is benefiting and advised to use her CGM at all times.  -Patient  is encouraged to call clinic for blood glucose levels less than 70 or above 200 mg /dl.  -She is tolerating Rybelsus, she is advised to continue Rybelsus 7 mg p.o. daily at breakfast.  She is advised to discontinue glipizide at this time. -Her renal function is still abnormal, however stable.   - Patient specific target  A1c;  LDL, HDL, Triglycerides, were discussed in detail.  2) BP/HTN:  -Her blood pressure is controlled to target.   -She did get her blood pressure medications adjusted by her nephrologist currently on furosemide 40 mg p.o. daily, spironolactone 50 mg p.o. daily, as well as Hyzaar 50/12.5 mg p.o. daily.    3) Lipids/HPL: Her recent lipid panel showed controlled LDL at 68.  She is advised to do simvastatin 40 mg p.o. daily at night.    Side effects and precautions discussed with her.  4) subclinical hypothyroidism: Resolved.  Her subsequent full set of thyroid function tests are within normal limits.   She will not need intervention at this time.  She will need repeat thyroid function test after her next visit.    5) obesity: Her BMI is 43.3-  - clearly complicating her diabetes care.  She is a candidate for modest weight loss.   Whole food plant-based diet was discussed and recommended to her.  6) Chronic Care/Health Maintenance:  -Patient is on ARB and Statin medications and encouraged to continue to follow up with Ophthalmology, Podiatrist at least yearly or according to recommendations, and advised to   stay away from smoking. I have recommended yearly flu vaccine and pneumonia  vaccination at least every 5 years; and  sleep for at least 7 hours a day.   Patient cannot exercise optimally.    - I advised patient to maintain close follow up with Ephriam Jenkins E for primary care needs.   I spent 41 minutes in the care of the patient today including review of labs from [facility], Lipids, Thyroid Function, Hematology (current and previous including abstractions from other facilities); face-to-face time discussing  her blood glucose readings/logs, discussing hypoglycemia and hyperglycemia episodes and symptoms, medications doses, her options of short and long term treatment based on the latest standards of care / guidelines;  discussion about incorporating lifestyle medicine;  and documenting the encounter. Risk reduction counseling performed per USPSTF guidelines to reduce  obesity and cardiovascular risk factors.     Please refer to Patient Instructions for Blood Glucose Monitoring and Insulin/Medications Dosing Guide"  in media tab for additional information. Please  also refer to " Patient Self Inventory" in the Media  tab for reviewed elements of pertinent patient history.  Tiffany Brock participated in the discussions, expressed understanding, and voiced agreement with the above plans.  All questions were answered to her satisfaction. she is encouraged to contact clinic should she have any questions or concerns prior to her return visit.    Follow up plan: - Return in about 4 months (around 10/03/2022) for F/U with Pre-visit Labs, Meter/CGM/Logs, A1c here.  Glade Lloyd, MD Phone: [phone]  Fax: [phone]  This note was partially dictated with voice recognition software. Similar sounding words can be transcribed inadequately or may not  be corrected upon review.  06/03/2022, 8:12 PM

## 2022-07-05 ENCOUNTER — Other Ambulatory Visit: Payer: Self-pay | Admitting: Nurse Practitioner

## 2022-07-05 ENCOUNTER — Other Ambulatory Visit: Payer: Self-pay | Admitting: "Endocrinology

## 2022-07-05 DIAGNOSIS — E1122 Type 2 diabetes mellitus with diabetic chronic kidney disease: Secondary | ICD-10-CM

## 2022-09-11 ENCOUNTER — Other Ambulatory Visit: Payer: Self-pay | Admitting: "Endocrinology

## 2022-10-06 ENCOUNTER — Encounter: Payer: Self-pay | Admitting: "Endocrinology

## 2022-10-06 ENCOUNTER — Ambulatory Visit: Payer: Medicare Other | Admitting: "Endocrinology

## 2022-10-06 VITALS — BP 122/60 | HR 76 | Ht 65.0 in | Wt 260.8 lb

## 2022-10-06 DIAGNOSIS — Z794 Long term (current) use of insulin: Secondary | ICD-10-CM

## 2022-10-06 DIAGNOSIS — I10 Essential (primary) hypertension: Secondary | ICD-10-CM

## 2022-10-06 DIAGNOSIS — N183 Chronic kidney disease, stage 3 unspecified: Secondary | ICD-10-CM

## 2022-10-06 DIAGNOSIS — E1122 Type 2 diabetes mellitus with diabetic chronic kidney disease: Secondary | ICD-10-CM | POA: Diagnosis not present

## 2022-10-06 DIAGNOSIS — E782 Mixed hyperlipidemia: Secondary | ICD-10-CM

## 2022-10-06 LAB — POCT GLYCOSYLATED HEMOGLOBIN (HGB A1C): HbA1c, POC (controlled diabetic range): 7.1 % — AB (ref 0.0–7.0)

## 2022-10-06 NOTE — Progress Notes (Signed)
10/06/2022  Endocrinology follow-up note   Subjective:    Patient ID: Tiffany Brock, female    DOB: 1945/10/05.  She is here for follow-up  for management of type 2 diabetes, hyperlipidemia, hypertension.  PCP: Richarda Blade E  Past Medical History:  Diagnosis Date   Anemia    Arthritis    Diabetes mellitus, type II    Dyspnea    on exertion   Headache    Hyperlipidemia    Hypertension    Iron deficiency anemia with multiple negative GI work-ups question AVMs 06/28/2019   Pneumonia    h/o   PONV (postoperative nausea and vomiting)    Sleep apnea    Vertigo    Past Surgical History:  Procedure Laterality Date   ABDOMINAL HYSTERECTOMY     APPENDECTOMY     BLEPHAROPLASTY Bilateral 01/2018   CARPAL TUNNEL RELEASE Left 2008   COLONOSCOPY     multiple   ESOPHAGOGASTRODUODENOSCOPY     multiple   GIVENS CAPSULE STUDY     HEMORROIDECTOMY     UMBILICAL HERNIA REPAIR     Social History   Socioeconomic History   Marital status: Married    Spouse name: Not on file   Number of children: Not on file   Years of education: Not on file   Highest education level: Not on file  Occupational History   Not on file  Tobacco Use   Smoking status: Never   Smokeless tobacco: Never  Vaping Use   Vaping Use: Never used  Substance and Sexual Activity   Alcohol use: No   Drug use: No   Sexual activity: Not on file  Other Topics Concern   Not on file  Social History Narrative   Married and retired   Lives in [place], [place]   No hx EtOH/tobacco/drugs   Social Determinants of Health   Financial Resource Strain: Not on file  Food Insecurity: Not on file  Transportation Needs: Not on file  Physical Activity: Not on file  Stress: Not on file  Social Connections: Not on file   Outpatient Encounter Medications as of 10/06/2022  Medication Sig   albuterol (PROVENTIL HFA;VENTOLIN HFA) 108 (90 Base) MCG/ACT inhaler Inhale 2 puffs into the lungs every 6 (six) hours as needed for  wheezing or shortness of breath.   B-D ULTRAFINE III SHORT PEN 31G X 8 MM MISC 1 EACH BY OTHER ROUTE SEE ADMIN INSTRUCTIONS. USE ONE PEN NEEDLE FOUR TIMES DAILY TO INJECT INSULIN   Continuous Blood Gluc Receiver (FREESTYLE LIBRE 2 READER) DEVI AS DIRECTED   Continuous Blood Gluc Sensor (FREESTYLE LIBRE 2 SENSOR) MISC USE 1 PIECE BY DOES NOT APPLY ROUTE EVERY 14 (FOURTEEN) DAYS   cyclobenzaprine (FLEXERIL) 10 MG tablet Take 1 tablet (10 mg total) by mouth 2 (two) times daily as needed for muscle spasms.   esomeprazole (NEXIUM) 40 MG capsule Take 40 mg by mouth daily.   Ferrous Fumarate (HEMOCYTE - 106 MG FE) 324 (106 Fe) MG TABS tablet Take 1 tablet by mouth.   fluticasone (FLONASE) 50 MCG/ACT nasal spray    furosemide (LASIX) 40 MG tablet Take 20 mg by mouth 2 (two) times daily.   gabapentin (NEURONTIN) 300 MG capsule Take 300 mg by mouth at bedtime. 2 tablets at bedtime   insulin aspart (NOVOLOG FLEXPEN) 100 UNIT/ML FlexPen INJECT 20- 26 UNITS INTO THE SKIN 3 (THREE) TIMES DAILY WITH MEALS.   insulin degludec (TRESIBA FLEXTOUCH) 100 UNIT/ML FlexTouch Pen INJECT 80 UNITS INTO  THE SKIN AT BEDTIME.   losartan-hydrochlorothiazide (HYZAAR) 50-12.5 MG tablet Take 1 tablet by mouth daily.   NON FORMULARY CPAP   PARoxetine (PAXIL) 10 MG tablet Take 10 mg by mouth daily.   Semaglutide (RYBELSUS) 7 MG TABS TAKE 7 MG BY MOUTH DAILY BEFORE BREAKFAST.   simvastatin (ZOCOR) 40 MG tablet Take 40 mg by mouth daily.   Vitamin D, Ergocalciferol, (DRISDOL) 50000 units CAPS capsule Take 50,000 Units by mouth every 14 (fourteen) days.   No facility-administered encounter medications on file as of 10/06/2022.   ALLERGIES: Allergies  Allergen Reactions   Biaxin [Clarithromycin] Hives   VACCINATION STATUS: Immunization History  Administered Date(s) Administered   Moderna Sars-Covid-2 Vaccination 08/19/2019, 09/16/2019    Diabetes She presents for her follow-up diabetic visit. She has type 2 diabetes  mellitus. Onset time: She was diagnosed at approximate age of 46 years. Her disease course has been improving. There are no hypoglycemic associated symptoms. Pertinent negatives for hypoglycemia include no confusion, headaches, pallor or seizures. Associated symptoms include fatigue. Pertinent negatives for diabetes include no blurred vision, no chest pain, no polydipsia, no polyphagia and no polyuria. There are no hypoglycemic complications. Symptoms are improving. Risk factors for coronary artery disease include diabetes mellitus, dyslipidemia, family history, hypertension, obesity and sedentary lifestyle. Current diabetic treatment includes insulin injections and oral agent (dual therapy). Her weight is fluctuating minimally. She is following a generally unhealthy diet. When asked about meal planning, she reported none. She has had a previous visit with a dietitian. She never participates in exercise. Her home blood glucose trend is decreasing steadily. Her breakfast blood glucose range is generally 140-180 mg/dl. Her lunch blood glucose range is generally 140-180 mg/dl. Her dinner blood glucose range is generally 140-180 mg/dl. Her bedtime blood glucose range is generally 140-180 mg/dl. Her overall blood glucose range is 140-180 mg/dl. ( Tiffany Brock presents with improved glycemic profile showing 60% time in range, 28% level 1 hyperglycemia, 8% level 2 hyperglycemia.  Her point-of-care A1c is 7.1% progressively improving.  She has no sustained hypoglycemia.  Her average blood glucose is 166 for the last 14 days.      ) An ACE inhibitor/angiotensin II receptor blocker is being taken. She sees a podiatrist.Eye exam is current.  Hyperlipidemia This is a chronic problem. The current episode started more than 1 year ago. The problem is controlled. Exacerbating diseases include diabetes and obesity. Pertinent negatives include no chest pain, myalgias or shortness of breath. Current antihyperlipidemic treatment  includes statins. Risk factors for coronary artery disease include diabetes mellitus, dyslipidemia, family history, obesity, hypertension and a sedentary lifestyle.  Hypertension This is a chronic problem. The current episode started more than 1 year ago. Pertinent negatives include no blurred vision, chest pain, headaches, palpitations or shortness of breath. Risk factors for coronary artery disease include dyslipidemia, diabetes mellitus, family history, obesity and sedentary lifestyle. Past treatments include angiotensin blockers.    Review of systems  Constitutional: + Minimally fluctuating body weight,  current  Body mass index is 43.4 kg/m. , + fatigue, no subjective hyperthermia, no subjective hypothermia   Objective:    BP 122/60   Pulse 76   Ht  (1.651 m)   Wt 260 lb 12.8 oz (118.3 kg)   BMI 43.40 kg/m   Wt Readings from Last 3 Encounters:  10/06/22 260 lb 12.8 oz (118.3 kg)  06/03/22 260 lb 3.2 oz (118 kg)  02/05/22 264 lb 6.4 oz (119.9 kg)     CMP  Component Value Date/Time   NA 140 01/29/2022 0000   K 4.1 01/29/2022 0000   CL 105 01/29/2022 0000   CO2 28 (A) 01/29/2022 0000   GLUCOSE 134 (H) 06/12/2021 1452   BUN 18 01/29/2022 0000   CREATININE 1.5 (A) 01/29/2022 0000   CREATININE 1.33 (H) 06/12/2021 1452   CALCIUM 8.6 (A) 01/29/2022 0000   ALBUMIN 3.0 (A) 01/29/2022 0000   AST 17 01/29/2022 0000   ALT 20 01/29/2022 0000   ALKPHOS 74 01/29/2022 0000   GFRNONAA 42 (L) 06/12/2021 1452   GFRAA 51 11/15/2020 0000   Diabetic Labs (most recent): Lab Results  Component Value Date   HGBA1C 7.1 (A) 10/06/2022   HGBA1C 7.9 (A) 06/03/2022   HGBA1C 7.8 (A) 02/05/2022   MICROALBUR 0.7 03/31/2018    Lipid Panel     Component Value Date/Time   CHOL 131 01/29/2022 0000   TRIG 108 01/29/2022 0000   HDL 43 01/29/2022 0000   LDLCALC 66 01/29/2022 0000     Assessment & Plan:   1. 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  Tiffany Brock presents with improved glycemic profile showing 60% time in range, 28% level 1 hyperglycemia, 8% level 2 hyperglycemia.  Her point-of-care A1c is 7.1% progressively improving.  She has no sustained hypoglycemia.  Her average blood glucose is 166 for the last 14 days.      Her diabetes is complicated by  CKD, obesity , and sedentary life and patient remains at a high risk for more acute and chronic complications of diabetes which include CAD, CVA, CKD, retinopathy, and neuropathy. These are all discussed in detail with the patient.  - I have counseled the patient on diet management and weight loss, by adopting a carbohydrate restricted/protein rich diet.  - she acknowledges that there is a room for improvement in her food and drink choices. - Suggestion is made for her to avoid simple carbohydrates  from her diet including Cakes, Sweet Desserts, Ice Cream, Soda (diet and regular), Sweet Tea, Candies, Chips, Cookies, Store Bought Juices, Alcohol in Excess of  1-2 drinks a day, Artificial Sweeteners,  Coffee Creamer, and "Sugar-free" Products, Lemonade. This will help patient to have more stable blood glucose profile and potentially avoid unintended weight gain.   - I encouraged the patient to switch to unprocessed or minimally processed complex starch and increased protein intake (animal or plant source), fruits, and vegetables.  - Patient is advised to stick to a routine mealtimes to eat 3 meals  a day and avoid unnecessary snacks ( to snack only to correct hypoglycemia).   - I have approached patient with the following individualized plan to manage diabetes and patient agrees:   -In light of her presentation with the above target glycemic profile, she will continue to need basal/bolus insulin in order for her to maintain control of diabetes to  target.   -Accordingly, she is advised to continue Tresiba 80 units nightly, advised to continue NovoLog  20 units 3 times a day before meals for pre-meal blood glucose above 90 mg/dL associated with strict monitoring of glucose 4 times a day-before meals and at bedtime. She is benefiting and advised to use her CGM at all times.  -Patient is encouraged to call clinic for blood glucose levels less than 70 or above 200 mg /dl.  -  She is tolerating Rybelsus, she is advised to continue Rybelsus 7 mg p.o. daily at breakfast.  He wishes to avoid escalation on the dose of Rybelsus at this time.   -Her renal function is still abnormal, however stable.   - Patient specific target  A1c;  LDL, HDL, Triglycerides, were discussed in detail.  2) BP/HTN:  -Pressure is controlled to target.   -She did get her blood pressure medications adjusted by her nephrologist currently on furosemide 40 mg p.o. daily, spironolactone 50 mg p.o. daily, as well as Hyzaar 50/12.5 mg p.o. daily.    3) Lipids/HPL: Her recent lipid panel showed controlled LDL at 68.  She is advised to do 40 mg p.o. nightly.     Side effects and precautions discussed with her.  4) subclinical hypothyroidism: Resolved.  Her subsequent full set of thyroid function tests are within normal limits.   She will not need intervention at this time.  She will need repeat thyroid function test after her next visit.    5) obesity: Her BMI is 43.3-  - clearly complicating her diabetes care.  She is a candidate for modest weight loss.   Whole food plant-based diet was discussed and recommended to her.  6) Chronic Care/Health Maintenance:  -Patient is on ARB and Statin medications and encouraged to continue to follow up with Ophthalmology, Podiatrist at least yearly or according to recommendations, and advised to   stay away from smoking. I have recommended yearly flu vaccine and pneumonia vaccination at least every 5 years; and  sleep for at least 7 hours a  day.   Patient cannot exercise optimally.    - I advised patient to maintain close follow up with Richarda Blade E for primary care needs.   I spent  41  minutes in the care of the patient today including review of labs from CMP, Lipids, Thyroid Function, Hematology (current and previous including abstractions from other facilities); face-to-face time discussing  her blood glucose readings/logs, discussing hypoglycemia and hyperglycemia episodes and symptoms, medications doses, her options of short and long term treatment based on the latest standards of care / guidelines;  discussion about incorporating lifestyle medicine;  and documenting the encounter. Risk reduction counseling performed per USPSTF guidelines to reduce  obesity and cardiovascular risk factors.     Please refer to Patient Instructions for Blood Glucose Monitoring and Insulin/Medications Dosing Guide"  in media tab for additional information. Please  also refer to " Patient Self Inventory" in the Media  tab for reviewed elements of pertinent patient history.  Caci Orren Mittag participated in the discussions, expressed understanding, and voiced agreement with the above plans.  All questions were answered to her satisfaction. she is encouraged to contact clinic should she have any questions or concerns prior to her return visit.   Follow up plan: - Return in about 4 months (around 02/05/2023) for F/U with Pre-visit Labs, Meter/CGM/Logs, A1c here.  Marquis Lunch, MD Phone: [phone]  Fax: [phone]  This note was partially dictated with voice recognition software. Similar sounding words can be transcribed inadequately or may not  be corrected upon review.  10/06/2022, 3:45 PM

## 2022-10-06 NOTE — Patient Instructions (Signed)

## 2022-10-15 ENCOUNTER — Other Ambulatory Visit: Payer: Self-pay | Admitting: "Endocrinology

## 2022-10-15 ENCOUNTER — Other Ambulatory Visit: Payer: Self-pay | Admitting: Nurse Practitioner

## 2022-10-15 DIAGNOSIS — E1122 Type 2 diabetes mellitus with diabetic chronic kidney disease: Secondary | ICD-10-CM

## 2022-10-16 NOTE — Telephone Encounter (Signed)
She sees Nida, but anything covered under insurance plan is good.

## 2022-10-22 ENCOUNTER — Telehealth: Payer: Self-pay

## 2022-10-22 NOTE — Telephone Encounter (Signed)
Left a message requesting pt return call to the office. 

## 2022-10-22 NOTE — Telephone Encounter (Signed)
Lft message for pt to call back. She is trying to get a refill of her mealtime insulin through [facility]. Everything is coming back not covered. When looking up the formulary it shows that she has to use a mail order pharmacy for Novolog but not sure which one her insurance requires.

## 2022-10-26 NOTE — Telephone Encounter (Signed)
Left a message requesting pt return call to the office. 

## 2022-10-26 NOTE — Telephone Encounter (Signed)
Spoke with pt, she stated she was not sure which mail order pharmacy her insurance preferred using. Pt is going to call her insurance company to confirm which mail order pharmacy we need to send her Rx to.

## 2022-10-27 ENCOUNTER — Telehealth: Payer: Self-pay

## 2022-10-27 NOTE — Telephone Encounter (Signed)
Left a message requesting pt return call to the office. 

## 2022-10-28 ENCOUNTER — Other Ambulatory Visit: Payer: Self-pay

## 2022-10-28 ENCOUNTER — Telehealth: Payer: Self-pay

## 2022-10-28 DIAGNOSIS — Z794 Long term (current) use of insulin: Secondary | ICD-10-CM

## 2022-10-28 MED ORDER — NOVOLOG FLEXPEN 100 UNIT/ML ~~LOC~~ SOPN
20.0000 [IU] | PEN_INJECTOR | Freq: Three times a day (TID) | SUBCUTANEOUS | 0 refills | Status: DC
Start: 2022-10-28 — End: 2022-12-30

## 2022-10-28 NOTE — Telephone Encounter (Signed)
Left a message requesting pt return call to the office. 

## 2022-11-30 IMAGING — MR MR LUMBAR SPINE W/O CM
5 series · 31 of 48 positions shown · non-contrast
Comparison: Radiographs 05/28/2021, CT 02/03/2018 and MRI
01/12/2018.

CLINICAL DATA: Low back pain, prior surgery.  New symptoms.

EXAM:
MRI LUMBAR SPINE WITHOUT CONTRAST
TECHNIQUE: Multiplanar, multisequence MR imaging of the lumbar spine was
performed. No intravenous contrast was administered.

[Series 5: T2 · sagittal · 4.0mm · 0.70mm/px · 6 of 16 slices shown (1 of 2)]
[im 1/16]
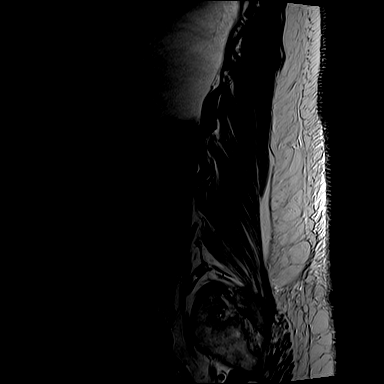
[im 4/16]
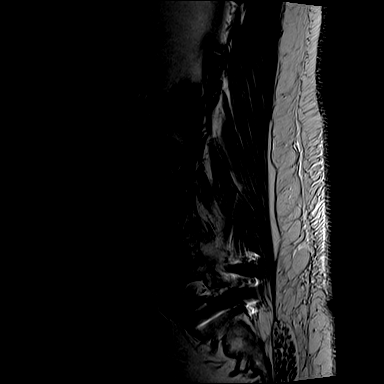
[im 7/16]
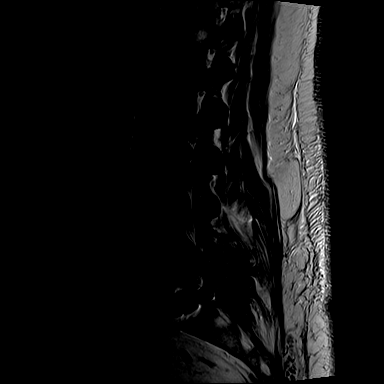
[im 10/16]
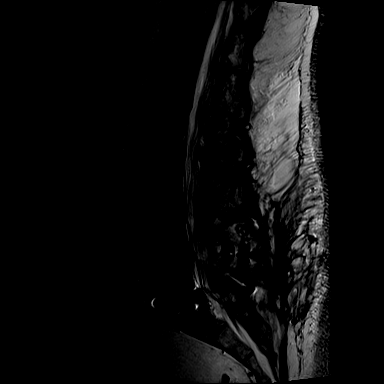
[im 13/16]
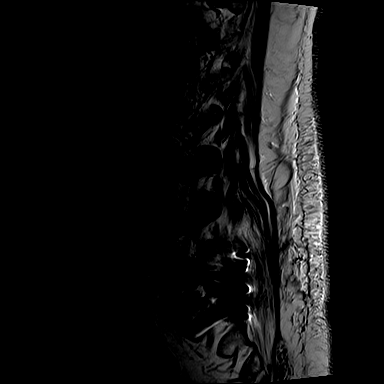
[im 16/16]
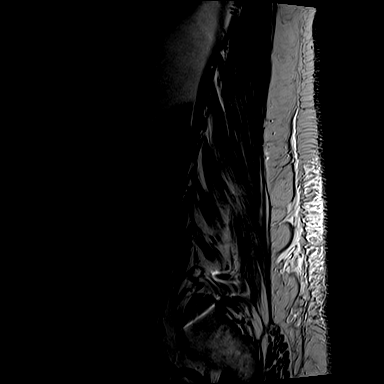

[Series 6: STIR · sagittal · 4.0mm · 0.53mm/px · 2 of 16 slices shown]
[im 1/16]
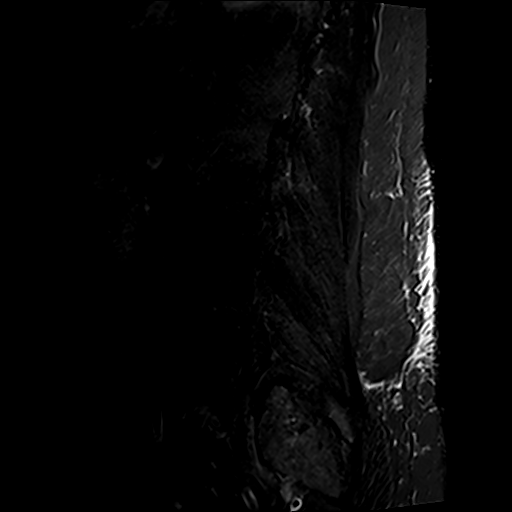
[im 3/16]
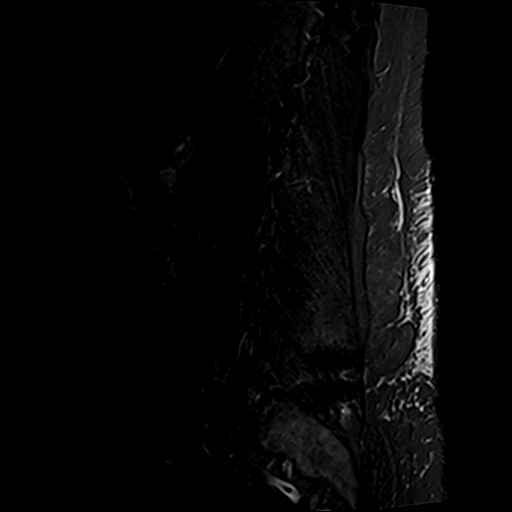

[Series 7: T1 · sagittal · 4.0mm · 1.05mm/px · 7 of 16 slices shown (1 of 2)]
[im 1/16]
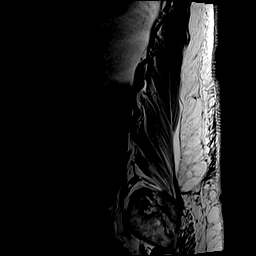
[im 3/16]
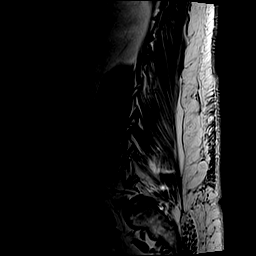
[im 6/16]
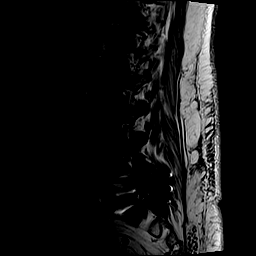
[im 8/16]
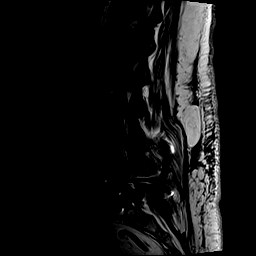
[im 11/16]
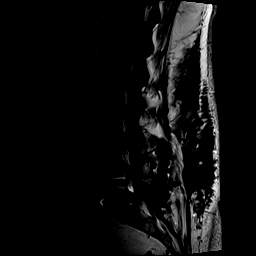
[im 13/16]
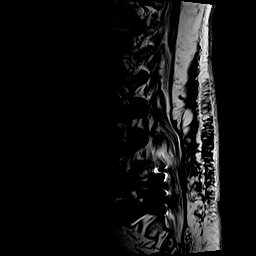
[im 16/16]
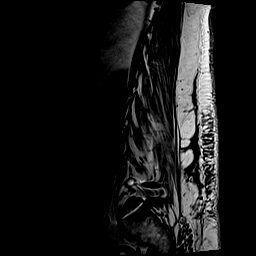

[Series 8: T2 · axial · 4.0mm · 0.70mm/px · z∈[+3,+181]mm · 8 of 33 slices shown (2 of 2)]
[im 1/33]
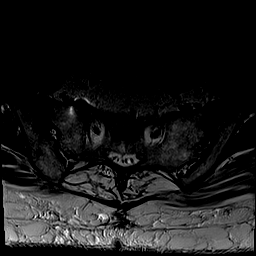
[im 5/33]
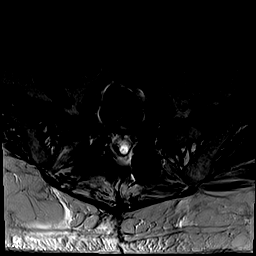
[im 10/33]
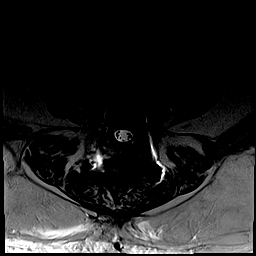
[im 15/33]
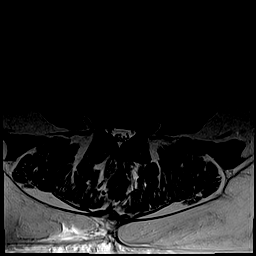
[im 18/33]
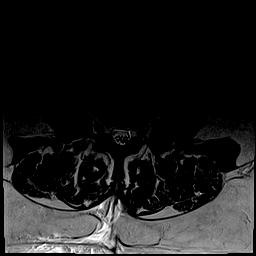
[im 23/33]
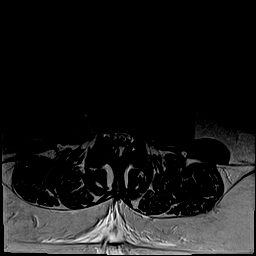
[im 28/33]
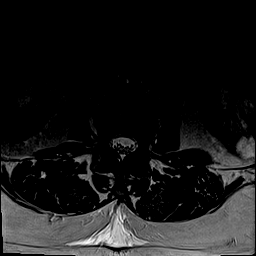
[im 33/33]
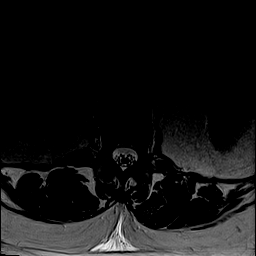

[Series 9: T1 · axial · 4.0mm · 0.35mm/px · z∈[+3,+181]mm · 8 of 33 slices shown (2 of 2)]
[im 1/33]
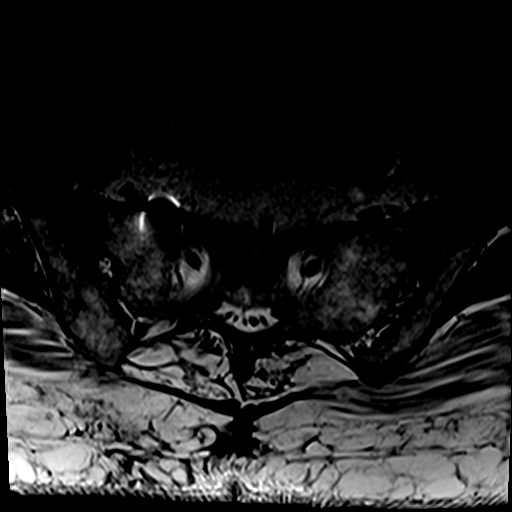
[im 5/33]
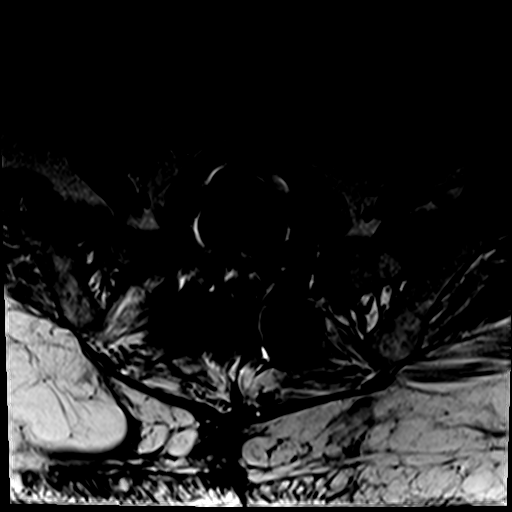
[im 10/33]
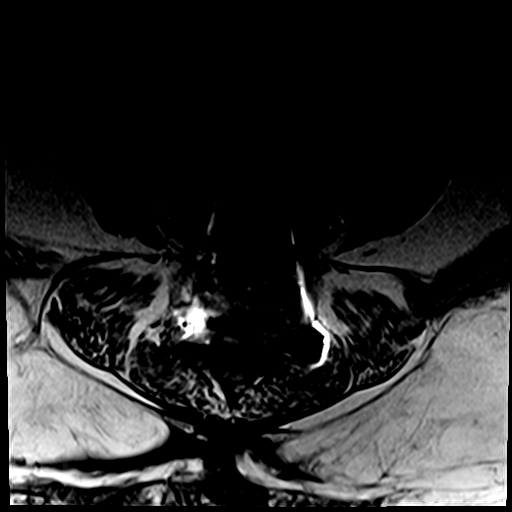
[im 15/33]
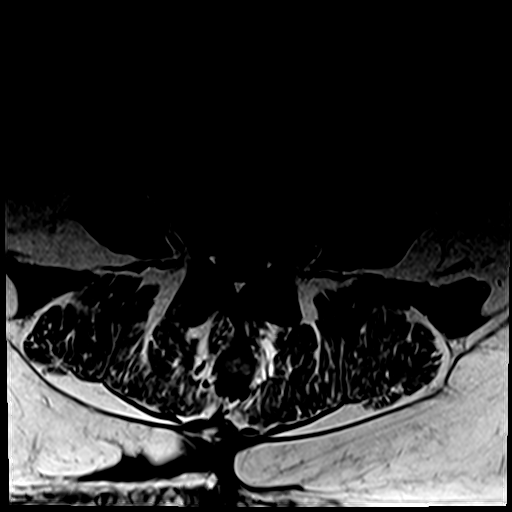
[im 18/33]
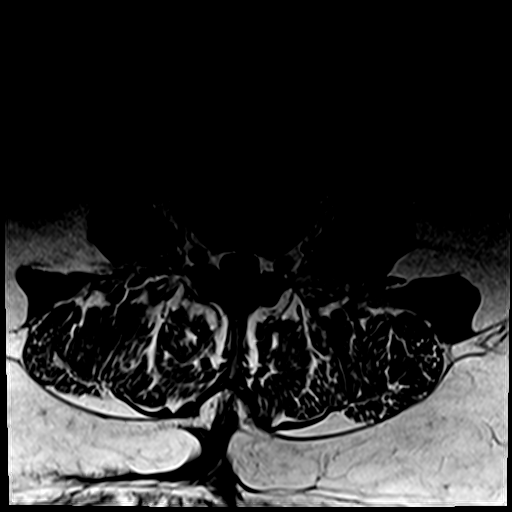
[im 23/33]
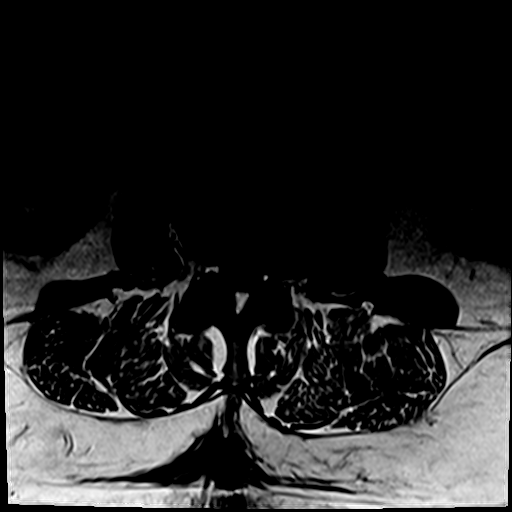
[im 28/33]
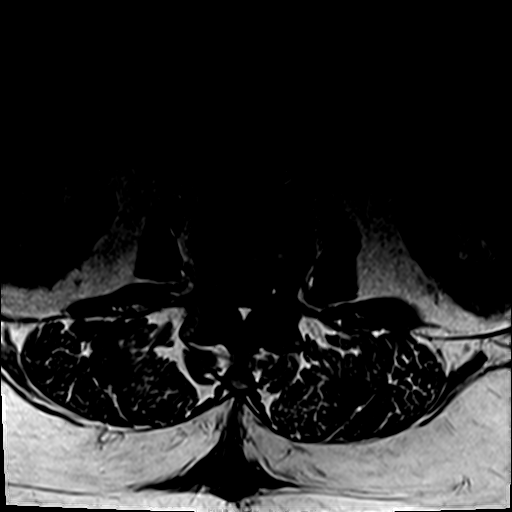
[im 33/33]
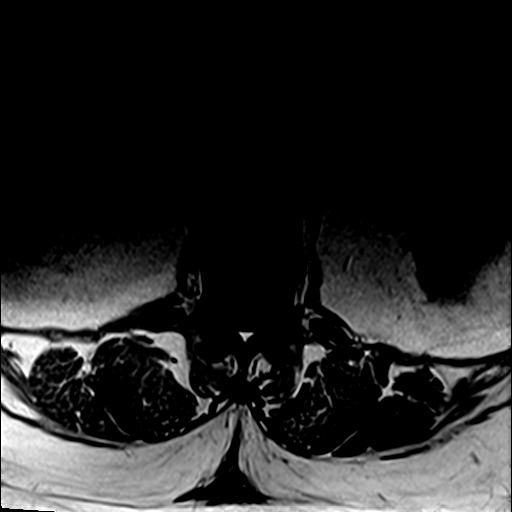

[31 of 48 positions shown; findings below may reference images not displayed]

FINDINGS: Segmentation: Conventional anatomy assumed, with the last open disc
space designated L5-S1.Concordant with previous imaging.

Alignment:  Physiologic.

Vertebrae: No worrisome osseous lesion, acute fracture or pars
defect. Status post interval laminectomy and PLIF at L5-S1. X stop
device and solid posterior fusion at L4-5 appear unchanged. Mild
sacroiliac degenerative changes bilaterally.

Conus medullaris: Extends to the L1 level and appears normal.

Paraspinal and other soft tissues: No significant paraspinal
findings. There is chronic subcutaneous edema within the lower back.
A cystic lesion in the interpolar region of the left kidney is
incompletely visualized and evaluated, although grossly unchanged in
size from previous MRI. There are probable renal parapelvic cysts
bilaterally.

Disc levels:

L1-2: Mild disc bulging and facet hypertrophy. No spinal stenosis or
nerve root encroachment.

L2-3: Disc bulging eccentric to the left with mild facet and
ligamentous hypertrophy. Stable mild left foraminal narrowing with
potential chronic left L2 nerve root encroachment. The spinal canal
and right foramen are patent.

L3-4: Stable mild disc bulging eccentric to the left with moderate
facet and ligamentous hypertrophy. Borderline spinal stenosis with
mild narrowing of the left foramen and both lateral recesses,
stable.

L4-5: Solid posterior fusion status post remote inter spinous
fusion. Stable mild left lateral recess narrowing. The foramina are
patent.

L5-S1: The spinal canal appears decompressed by the interval
laminectomies and partial facetectomies. Probable epidural fibrosis
surrounding the left S1 nerve root sleeve. Mild residual narrowing
of the lateral recesses and foramina bilaterally.
IMPRESSION: 1. Interval laminectomies and PLIF at L5-S1 with decompression of
the spinal canal. Mild residual lateral recess and foraminal
narrowing without nerve root encroachment.
2. Stable solid posterior fusion at L4-5 without significant spinal
stenosis.
3. Stable appearance of the L3-4 disc space without significant
adjacent segment disease.
4. Stable chronic mild left foraminal narrowing at L2-3.
5. No acute findings identified.

## 2022-12-13 ENCOUNTER — Other Ambulatory Visit: Payer: Self-pay | Admitting: "Endocrinology

## 2022-12-30 ENCOUNTER — Telehealth: Payer: Self-pay

## 2022-12-30 ENCOUNTER — Other Ambulatory Visit: Payer: Self-pay

## 2022-12-30 DIAGNOSIS — N183 Chronic kidney disease, stage 3 unspecified: Secondary | ICD-10-CM

## 2022-12-30 MED ORDER — INSULIN SYRINGES (DISPOSABLE) U-100 0.5 ML MISC
0 refills | Status: AC
Start: 2022-12-30 — End: ?

## 2022-12-30 MED ORDER — BD PEN NEEDLE SHORT U/F 31G X 8 MM MISC
1.0000 | 3 refills | Status: DC
Start: 2022-12-30 — End: 2024-01-27

## 2022-12-30 MED ORDER — INSULIN LISPRO (1 UNIT DIAL) 100 UNIT/ML (KWIKPEN): 20.0000 [IU] | PEN_INJECTOR | Freq: Three times a day (TID) | SUBCUTANEOUS | 0 refills | Status: DC

## 2022-12-30 NOTE — Telephone Encounter (Signed)
Left a message requesting pt to return call to the office 

## 2023-01-19 ENCOUNTER — Other Ambulatory Visit: Payer: Self-pay | Admitting: "Endocrinology

## 2023-02-09 ENCOUNTER — Ambulatory Visit: Payer: Medicare Other | Admitting: "Endocrinology

## 2023-02-11 ENCOUNTER — Other Ambulatory Visit: Payer: Self-pay | Admitting: "Endocrinology

## 2023-02-11 DIAGNOSIS — E1122 Type 2 diabetes mellitus with diabetic chronic kidney disease: Secondary | ICD-10-CM

## 2023-02-25 ENCOUNTER — Other Ambulatory Visit: Payer: Self-pay | Admitting: "Endocrinology

## 2023-02-28 ENCOUNTER — Other Ambulatory Visit: Payer: Self-pay | Admitting: "Endocrinology

## 2023-02-28 DIAGNOSIS — E1122 Type 2 diabetes mellitus with diabetic chronic kidney disease: Secondary | ICD-10-CM

## 2023-03-13 ENCOUNTER — Other Ambulatory Visit: Payer: Self-pay | Admitting: Nurse Practitioner

## 2023-03-15 LAB — TSH
EGFR: 39
TSH: 4.07 (ref 0.41–5.90)

## 2023-03-15 LAB — BASIC METABOLIC PANEL
BUN: 21 (ref 4–21)
CO2: 30 — AB (ref 13–22)
Chloride: 106 (ref 99–108)
Creatinine: 1.4 — AB (ref 0.5–1.1)
Glucose: 115
Potassium: 3.9 meq/L (ref 3.5–5.1)
Sodium: 140 (ref 137–147)

## 2023-03-15 LAB — HEPATIC FUNCTION PANEL
ALT: 17 U/L (ref 7–35)
AST: 18 (ref 13–35)
Alkaline Phosphatase: 73 (ref 25–125)
Bilirubin, Total: 0.7

## 2023-03-15 LAB — LIPID PANEL
Cholesterol: 128 (ref 0–200)
HDL: 44 (ref 35–70)
LDL Cholesterol: 69
Triglycerides: 77 (ref 40–160)

## 2023-03-15 LAB — COMPREHENSIVE METABOLIC PANEL
Albumin: 3.2 — AB (ref 3.5–5.0)
Calcium: 8.9 (ref 8.7–10.7)

## 2023-03-22 ENCOUNTER — Other Ambulatory Visit: Payer: Self-pay | Admitting: "Endocrinology

## 2023-03-22 ENCOUNTER — Ambulatory Visit (INDEPENDENT_AMBULATORY_CARE_PROVIDER_SITE_OTHER): Payer: Medicare Other | Admitting: "Endocrinology

## 2023-03-22 ENCOUNTER — Encounter: Payer: Self-pay | Admitting: "Endocrinology

## 2023-03-22 VITALS — BP 114/56 | HR 64 | Ht 65.0 in | Wt 263.2 lb

## 2023-03-22 DIAGNOSIS — Z794 Long term (current) use of insulin: Secondary | ICD-10-CM | POA: Insufficient documentation

## 2023-03-22 DIAGNOSIS — E782 Mixed hyperlipidemia: Secondary | ICD-10-CM | POA: Diagnosis not present

## 2023-03-22 DIAGNOSIS — E1122 Type 2 diabetes mellitus with diabetic chronic kidney disease: Secondary | ICD-10-CM | POA: Diagnosis not present

## 2023-03-22 DIAGNOSIS — N183 Chronic kidney disease, stage 3 unspecified: Secondary | ICD-10-CM | POA: Diagnosis not present

## 2023-03-22 DIAGNOSIS — I10 Essential (primary) hypertension: Secondary | ICD-10-CM | POA: Diagnosis not present

## 2023-03-22 DIAGNOSIS — E559 Vitamin D deficiency, unspecified: Secondary | ICD-10-CM

## 2023-03-22 DIAGNOSIS — E038 Other specified hypothyroidism: Secondary | ICD-10-CM

## 2023-03-22 LAB — POCT GLYCOSYLATED HEMOGLOBIN (HGB A1C): HbA1c, POC (controlled diabetic range): 7.9 % — AB (ref 0.0–7.0)

## 2023-03-22 MED ORDER — FREESTYLE LIBRE 3 READER DEVI: 1.0000 | Freq: Once | 0 refills | Status: DC | PRN

## 2023-03-22 MED ORDER — INSULIN LISPRO (1 UNIT DIAL) 100 UNIT/ML (KWIKPEN): 14.0000 [IU] | PEN_INJECTOR | Freq: Three times a day (TID) | SUBCUTANEOUS | 1 refills | Status: DC

## 2023-03-22 MED ORDER — TRESIBA FLEXTOUCH 100 UNIT/ML ~~LOC~~ SOPN
90.0000 [IU] | PEN_INJECTOR | Freq: Every day | SUBCUTANEOUS | 2 refills | Status: DC
Start: 2023-03-22 — End: 2023-06-21

## 2023-03-22 MED ORDER — FREESTYLE LIBRE 3 SENSOR MISC: 1.0000 | 3 refills | Status: DC

## 2023-03-22 MED ORDER — EMPAGLIFLOZIN 10 MG PO TABS: 10.0000 mg | ORAL_TABLET | Freq: Every day | ORAL | 1 refills | Status: DC

## 2023-03-22 NOTE — Progress Notes (Signed)
03/22/2023  Endocrinology follow-up note   Subjective:    Patient ID: Tiffany Brock, female    DOB: June 05, 1946.  She is here for follow-up  for management of type 2 diabetes, hyperlipidemia, hypertension.  PCP: Richarda Blade E  Past Medical History:  Diagnosis Date   Anemia    Arthritis    Diabetes mellitus, type II (HCC)    Dyspnea    on exertion   Headache    Hyperlipidemia    Hypertension    Iron deficiency anemia with multiple negative GI work-ups question AVMs 06/28/2019   Pneumonia    h/o   PONV (postoperative nausea and vomiting)    Sleep apnea    Vertigo    Past Surgical History:  Procedure Laterality Date   ABDOMINAL HYSTERECTOMY     APPENDECTOMY     BLEPHAROPLASTY Bilateral 01/2018   CARPAL TUNNEL RELEASE Left 2008   COLONOSCOPY     multiple   ESOPHAGOGASTRODUODENOSCOPY     multiple   GIVENS CAPSULE STUDY     HEMORROIDECTOMY     UMBILICAL HERNIA REPAIR     Social History   Socioeconomic History   Marital status: Married    Spouse name: Not on file   Number of children: Not on file   Years of education: Not on file   Highest education level: Not on file  Occupational History   Not on file  Tobacco Use   Smoking status: Never   Smokeless tobacco: Never  Vaping Use   Vaping status: Never Used  Substance and Sexual Activity   Alcohol use: No   Drug use: No   Sexual activity: Not on file  Other Topics Concern   Not on file  Social History Narrative   Married and retired   Lives in [place], [place]   No hx EtOH/tobacco/drugs   Social Determinants of Health   Financial Resource Strain: Low Risk  (02/18/2018)   Received from [facility], [facility]   Overall Financial Resource Strain (CARDIA)    Difficulty of Paying Living Expenses: Not hard at all  Food Insecurity: Unknown (02/18/2018)   Received from [facility], [facility]   Hunger Vital Sign    Worried About Running Out of Food in the Last Year: Patient declined     Ran Out of Food in the Last Year: Patient declined  Transportation Needs: Unknown (02/18/2018)   Received from [facility], [facility]   [facility] - Transportation    Lack of Transportation (Medical): Patient declined    Lack of Transportation (Non-Medical): Patient declined  Physical Activity: Unknown (02/18/2018)   Received from [facility], [facility]   Exercise Vital Sign    Days of Exercise per Week: Patient declined    Minutes of Exercise per Session: Patient declined  Stress: No Stress Concern Present (02/18/2018)   Received from [facility], [facility] [employer] [facility] - Occupational Stress Questionnaire    Feeling of Stress : Not at all  Social Connections: Unknown (02/18/2018)   Received from [facility], [facility]   Social Connection and Isolation Panel [NHANES]    Frequency of Communication with Friends and Family: Patient declined    Frequency of Social Gatherings with Friends and Family: Patient declined    Attends Religious Services: Patient declined    Database administrator or Organizations: Patient declined    Attends Banker Meetings: Patient  declined    Marital Status: Patient declined   Outpatient Encounter Medications as of 03/22/2023  Medication Sig   Continuous Glucose Receiver (FREESTYLE LIBRE 3 READER) DEVI 1 Piece by Does not apply route once as needed for up to 1 dose.   Continuous Glucose Sensor (FREESTYLE LIBRE 3 SENSOR) MISC 1 Piece by Does not apply route every 14 (fourteen) days. Place 1 sensor on the skin every 14 days. Use to check glucose continuously   empagliflozin (JARDIANCE) 10 MG TABS tablet Take 1 tablet (10 mg total) by mouth daily before breakfast.   ADMELOG 100 UNIT/ML injection INJECT 0.2-0.26 MLS (20-26 UNITS TOTAL) INTO THE SKIN 3 (THREE) TIMES DAILY WITH MEALS   albuterol (PROVENTIL HFA;VENTOLIN HFA) 108 (90 Base) MCG/ACT inhaler Inhale 2 puffs into the lungs every 6  (six) hours as needed for wheezing or shortness of breath.   cyclobenzaprine (FLEXERIL) 10 MG tablet Take 1 tablet (10 mg total) by mouth 2 (two) times daily as needed for muscle spasms.   esomeprazole (NEXIUM) 40 MG capsule Take 40 mg by mouth daily.   Ferrous Fumarate (HEMOCYTE - 106 MG FE) 324 (106 Fe) MG TABS tablet Take 1 tablet by mouth.   fluticasone (FLONASE) 50 MCG/ACT nasal spray    furosemide (LASIX) 40 MG tablet Take 20 mg by mouth 2 (two) times daily.   gabapentin (NEURONTIN) 300 MG capsule Take 300 mg by mouth at bedtime. 2 tablets at bedtime   insulin degludec (TRESIBA FLEXTOUCH) 100 UNIT/ML FlexTouch Pen Inject 90 Units into the skin at bedtime.   insulin lispro (ADMELOG SOLOSTAR) 100 UNIT/ML KwikPen Inject 14-20 Units into the skin 3 (three) times daily before meals.   Insulin Pen Needle (B-D ULTRAFINE III SHORT PEN) 31G X 8 MM MISC 1 each by Other route See admin instructions. Use one pen needle four times daily to inject insulin   Insulin Syringes, Disposable, U-100 0.5 ML MISC Use to inject insulin three times daily   losartan-hydrochlorothiazide (HYZAAR) 50-12.5 MG tablet Take 1 tablet by mouth daily.   NON FORMULARY CPAP   PARoxetine (PAXIL) 10 MG tablet Take 10 mg by mouth daily.   simvastatin (ZOCOR) 40 MG tablet Take 40 mg by mouth daily.   Vitamin D, Ergocalciferol, (DRISDOL) 50000 units CAPS capsule Take 50,000 Units by mouth every 14 (fourteen) days.   [DISCONTINUED] Continuous Blood Gluc Receiver (FREESTYLE LIBRE 2 READER) DEVI AS DIRECTED   [DISCONTINUED] Continuous Blood Gluc Sensor (FREESTYLE LIBRE 2 SENSOR) MISC USE 1 PIECE BY DOES NOT APPLY ROUTE EVERY 14 (FOURTEEN) DAYS   [DISCONTINUED] insulin degludec (TRESIBA FLEXTOUCH) 100 UNIT/ML FlexTouch Pen INJECT 80 UNITS INTO THE SKIN AT BEDTIME   [DISCONTINUED] insulin lispro (ADMELOG SOLOSTAR) 100 UNIT/ML KwikPen Inject 20 Units into the skin 3 (three) times daily with meals.   [DISCONTINUED] RYBELSUS 7 MG TABS TAKE  1 TABLET BY MOUTH EVERY DAY BEFORE BREAKFAST (Patient not taking: Reported on 03/22/2023)   No facility-administered encounter medications on file as of 03/22/2023.   ALLERGIES: Allergies  Allergen Reactions   Biaxin [Clarithromycin] Hives   VACCINATION STATUS: Immunization History  Administered Date(s) Administered   Moderna Sars-Covid-2 Vaccination 08/19/2019, 09/16/2019, 05/29/2020    Diabetes She presents for her follow-up diabetic visit. She has type 2 diabetes mellitus. Onset time: She was diagnosed at approximate age of 46 years. Her disease course has been worsening. There are no hypoglycemic associated symptoms. Pertinent negatives for hypoglycemia include no confusion, headaches, pallor or seizures. Associated symptoms include fatigue. Pertinent negatives  for diabetes include no blurred vision, no chest pain, no polydipsia, no polyphagia and no polyuria. There are no hypoglycemic complications. Symptoms are worsening. Diabetic complications include nephropathy. Risk factors for coronary artery disease include diabetes mellitus, dyslipidemia, family history, hypertension, obesity and sedentary lifestyle. Current diabetic treatment includes insulin injections and oral agent (dual therapy). Her weight is fluctuating minimally. She is following a generally unhealthy diet. When asked about meal planning, she reported none. She has had a previous visit with a dietitian. She never participates in exercise. Her home blood glucose trend is increasing steadily. Her breakfast blood glucose range is generally >200 mg/dl. Her lunch blood glucose range is generally >200 mg/dl. Her dinner blood glucose range is generally >200 mg/dl. Her bedtime blood glucose range is generally >200 mg/dl. Her overall blood glucose range is >200 mg/dl. ( Ms. Ty Hilts presents with worsening glycemic profile.  Her CGM AGP shows 29% time in range, 35% level 1 hyperglycemia, 35% level 2 hyperglycemia.  She has no hypoglycemia.   Her point-of-care A1c is 7.9% increasing from 7.1%.  Her 14 days average blood glucose is 219 mg per DL.  This is mainly due to the fact that she has discontinued her Rybelsus due to side effects.  She did not call clinic for replacement treatment.  ) An ACE inhibitor/angiotensin II receptor blocker is being taken. She sees a podiatrist.Eye exam is current.  Hyperlipidemia This is a chronic problem. The current episode started more than 1 year ago. The problem is controlled. Exacerbating diseases include diabetes and obesity. Pertinent negatives include no chest pain, myalgias or shortness of breath. Current antihyperlipidemic treatment includes statins. Risk factors for coronary artery disease include diabetes mellitus, dyslipidemia, family history, obesity, hypertension and a sedentary lifestyle.  Hypertension This is a chronic problem. The current episode started more than 1 year ago. Pertinent negatives include no blurred vision, chest pain, headaches, palpitations or shortness of breath. Risk factors for coronary artery disease include dyslipidemia, diabetes mellitus, family history, obesity and sedentary lifestyle. Past treatments include angiotensin blockers.    Review of systems  Constitutional: + Minimally fluctuating body weight,  current  Body mass index is 43.8 kg/m. , + fatigue, no subjective hyperthermia, no subjective hypothermia   Objective:    BP (!) 114/56   Pulse 64   Ht 5\' 5"  (1.651 m)   Wt 263 lb 3.2 oz (119.4 kg)   BMI 43.80 kg/m   Wt Readings from Last 3 Encounters:  03/22/23 263 lb 3.2 oz (119.4 kg)  10/06/22 260 lb 12.8 oz (118.3 kg)  06/03/22 260 lb 3.2 oz (118 kg)     CMP     Component Value Date/Time   NA 140 03/15/2023 0000   K 3.9 03/15/2023 0000   CL 106 03/15/2023 0000   CO2 30 (A) 03/15/2023 0000   GLUCOSE 134 (H) 06/12/2021 1452   BUN 21 03/15/2023 0000   CREATININE 1.4 (A) 03/15/2023 0000   CREATININE 1.33 (H) 06/12/2021 1452   CALCIUM 8.9  03/15/2023 0000   ALBUMIN 3.2 (A) 03/15/2023 0000   AST 18 03/15/2023 0000   ALT 17 03/15/2023 0000   ALKPHOS 73 03/15/2023 0000   GFRNONAA 42 (L) 06/12/2021 1452   GFRAA 51 11/15/2020 0000   Diabetic Labs (most recent): Lab Results  Component Value Date   HGBA1C 7.9 (A) 03/22/2023   HGBA1C 7.1 (A) 10/06/2022   HGBA1C 7.9 (A) 06/03/2022   MICROALBUR 0.7 03/31/2018    Lipid Panel  Component Value Date/Time   CHOL 128 03/15/2023 0000   TRIG 77 03/15/2023 0000   HDL 44 03/15/2023 0000   LDLCALC 69 03/15/2023 0000     Assessment & Plan:   1. 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  Ms. Ty Hilts presents with worsening glycemic profile.  Her CGM AGP shows 29% time in range, 35% level 1 hyperglycemia, 35% level 2 hyperglycemia.  She has no hypoglycemia.  Her point-of-care A1c is 7.9% increasing from 7.1%.  Her 14 days average blood glucose is 219 mg per DL.  This is mainly due to the fact that she has discontinued her Rybelsus due to side effects.  She did not call clinic for replacement treatment.    Her diabetes is complicated by  CKD, obesity , and sedentary life and patient remains at a high risk for more acute and chronic complications of diabetes which include CAD, CVA, CKD, retinopathy, and neuropathy. These are all discussed in detail with the patient.  - I have counseled the patient on diet management and weight loss, by adopting a carbohydrate restricted/protein rich diet.   - she acknowledges that there is a room for improvement in her food and drink choices. - Suggestion is made for her to avoid simple carbohydrates  from her diet including Cakes, Sweet Desserts, Ice Cream, Soda (diet and regular), Sweet Tea, Candies, Chips, Cookies, Store Bought Juices, Alcohol in Excess of  1-2 drinks a day, Artificial  Sweeteners,  Coffee Creamer, and "Sugar-free" Products, Lemonade. This will help patient to have more stable blood glucose profile and potentially avoid unintended weight gain.  - I encouraged the patient to switch to unprocessed or minimally processed complex starch and increased protein intake (animal or plant source), fruits, and vegetables.  - Patient is advised to stick to a routine mealtimes to eat 3 meals  a day and avoid unnecessary snacks ( to snack only to correct hypoglycemia).   - I have approached patient with the following individualized plan to manage diabetes and patient agrees:   -In light of her presentation with loss of control of glycemia, she will need a higher dose of basal/bolus insulin in order for her to maintain control of diabetes to target.    -Accordingly, she is advised to increase her Tresiba to 90 units nightly, advised to lower her Humalog to 14-20 units 3 times a day before meals for pre-meal blood glucose above 90 mg/dL associated with strict monitoring of glucose 4 times a day-before meals and at bedtime. She is benefiting and advised to use her CGM at all times. -She did not tolerate Rybelsus which was discontinued.  I discussed and added Jardiance 10 mg p.o. daily at breakfast.  She would benefit from this medication from CKD point of view.  Side effects and precautions discussed with her.  -Patient is encouraged to call clinic for blood glucose levels less than 70 or above 200 mg /dl. -She will benefit from a CGM upgrade.  I discussed and prescribed the freestyle libre 3 device for her. -Her renal function is still abnormal, however stable.   - Patient specific target  A1c;  LDL, HDL, Triglycerides, were discussed in detail.  2) BP/HTN:  -Her blood pressure is controlled to target.   -She did get her blood pressure medications adjusted by her nephrologist currently on furosemide 40  mg p.o. daily, spironolactone 50 mg p.o. daily, as well as Hyzaar 50/12.5 mg  p.o. daily.    3) Lipids/HPL: Her recent lipid panel showed controlled LDL at 68.  She is advised to continue simvastatin 40 mg p.o. nightly.  Side effects and precautions discussed with her.     4) subclinical hypothyroidism: Resolved.  Her subsequent full set of thyroid function tests are within normal limits.   She will not need intervention at this time.  She will need repeat thyroid function test after her next visit.    5) obesity: Her BMI is 43.8-  - clearly complicating her diabetes care.  She is a candidate for modest weight loss.   Whole food plant-based diet was discussed and recommended to her.  6) Chronic Care/Health Maintenance:  -Patient is on ARB and Statin medications and encouraged to continue to follow up with Ophthalmology, Podiatrist at least yearly or according to recommendations, and advised to   stay away from smoking. I have recommended yearly flu vaccine and pneumonia vaccination at least every 5 years; and  sleep for at least 7 hours a day.   Patient cannot exercise optimally.    - I advised patient to maintain close follow up with Richarda Blade E for primary care needs.   I spent  41  minutes in the care of the patient today including review of labs from CMP, Lipids, Thyroid Function, Hematology (current and previous including abstractions from other facilities); face-to-face time discussing  her blood glucose readings/logs, discussing hypoglycemia and hyperglycemia episodes and symptoms, medications doses, her options of short and long term treatment based on the latest standards of care / guidelines;  discussion about incorporating lifestyle medicine;  and documenting the encounter. Risk reduction counseling performed per USPSTF guidelines to reduce  obesity and cardiovascular risk factors.     Please refer to Patient Instructions for Blood Glucose Monitoring and Insulin/Medications Dosing Guide"  in media tab for additional information. Please  also refer to "  Patient Self Inventory" in the Media  tab for reviewed elements of pertinent patient history.  Yamina Sponseller Spitzley participated in the discussions, expressed understanding, and voiced agreement with the above plans.  All questions were answered to her satisfaction. she is encouraged to contact clinic should she have any questions or concerns prior to her return visit.    Follow up plan: - Return in about 4 months (around 07/22/2023) for Bring Meter/CGM Device/Logs- A1c in Office.  Marquis Lunch, MD Phone: [phone]  Fax: [phone]  This note was partially dictated with voice recognition software. Similar sounding words can be transcribed inadequately or may not  be corrected upon review.  03/22/2023, 2:30 PM

## 2023-03-22 NOTE — Patient Instructions (Signed)
                                     Advice for Weight Management  -For most of us the best way to lose weight is by diet management. Generally speaking, diet management means consuming less calories intentionally which over time brings about progressive weight loss.  This can be achieved more effectively by avoiding ultra processed carbohydrates, processed meats, unhealthy fats.    It is critically important to know your numbers: how much calorie you are consuming and how much calorie you need. More importantly, our carbohydrates sources should be unprocessed naturally occurring  complex starch food items.  It is always important to balance nutrition also by  appropriate intake of proteins (mainly plant-based), healthy fats/oils, plenty of fruits and vegetables.   -The American College of Lifestyle Medicine (ACL M) recommends nutrition derived mostly from Whole Food, Plant Predominant Sources example an apple instead of applesauce or apple pie. Eat Plenty of vegetables, Mushrooms, fruits, Legumes, Whole Grains, Nuts, seeds in lieu of processed meats, processed snacks/pastries red meat, poultry, eggs.  Use only water or unsweetened tea for hydration.  The College also recommends the need to stay away from risky substances including alcohol, smoking; obtaining 7-9 hours of restorative sleep, at least 150 minutes of moderate intensity exercise weekly, importance of healthy social connections, and being mindful of stress and seek help when it is overwhelming.    -Sticking to a routine mealtime to eat 3 meals a day and avoiding unnecessary snacks is shown to have a big role in weight control. Under normal circumstances, the only time we burn stored energy is when we are hungry, so allow  some hunger to take place- hunger means no food between appropriate meal times, only water.  It is not advisable to starve.   -It is better to avoid simple carbohydrates including:  Cakes, Sweet Desserts, Ice Cream, Soda (diet and regular), Sweet Tea, Candies, Chips, Cookies, Store Bought Juices, Alcohol in Excess of  1-2 drinks a day, Lemonade,  Artificial Sweeteners, Doughnuts, Coffee Creamers, "Sugar-free" Products, etc, etc.  This is not a complete list.....    -Consulting with certified diabetes educators is proven to provide you with the most accurate and current information on diet.  Also, you may be  interested in discussing diet options/exchanges , we can schedule a visit with Tiffany Brock, RDN, CDE for individualized nutrition education.  -Exercise: If you are able: 30 -60 minutes a day ,4 days a week, or 150 minutes of moderate intensity exercise weekly.    The longer the better if tolerated.  Combine stretch, strength, and aerobic activities.  If you were told in the past that you have high risk for cardiovascular diseases, or if you are currently symptomatic, you may seek evaluation by your heart doctor prior to initiating moderate to intense exercise programs.                                  Additional Care Considerations for Diabetes/Prediabetes   -Diabetes  is a chronic disease.  The most important care consideration is regular follow-up with your diabetes care provider with the goal being avoiding or delaying its complications and to take advantage of advances in medications and technology.  If appropriate actions are taken early enough, type 2 diabetes can even be   reversed.  Seek information from the right source.  - Whole Food, Plant Predominant Nutrition is highly recommended: Eat Plenty of vegetables, Mushrooms, fruits, Legumes, Whole Grains, Nuts, seeds in lieu of processed meats, processed snacks/pastries red meat, poultry, eggs as recommended by American College of  Lifestyle Medicine (ACLM).  -Type 2 diabetes is known to coexist with other important comorbidities such as high blood pressure and high cholesterol.  It is critical to control not only the  diabetes but also the high blood pressure and high cholesterol to minimize and delay the risk of complications including coronary artery disease, stroke, amputations, blindness, etc.  The good news is that this diet recommendation for type 2 diabetes is also very helpful for managing high cholesterol and high blood blood pressure.  - Studies showed that people with diabetes will benefit from a class of medications known as ACE inhibitors and statins.  Unless there are specific reasons not to be on these medications, the standard of care is to consider getting one from these groups of medications at an optimal doses.  These medications are generally considered safe and proven to help protect the heart and the kidneys.    - People with diabetes are encouraged to initiate and maintain regular follow-up with eye doctors, foot doctors, dentists , and if necessary heart and kidney doctors.     - It is highly recommended that people with diabetes quit smoking or stay away from smoking, and get yearly  flu vaccine and pneumonia vaccine at least every 5 years.  See above for additional recommendations on exercise, sleep, stress management , and healthy social connections.      

## 2023-06-20 ENCOUNTER — Other Ambulatory Visit: Payer: Self-pay | Admitting: "Endocrinology

## 2023-06-21 ENCOUNTER — Other Ambulatory Visit: Payer: Self-pay | Admitting: "Endocrinology

## 2023-06-21 DIAGNOSIS — E1122 Type 2 diabetes mellitus with diabetic chronic kidney disease: Secondary | ICD-10-CM

## 2023-07-26 ENCOUNTER — Ambulatory Visit: Payer: Medicare Other | Admitting: "Endocrinology

## 2023-08-09 ENCOUNTER — Encounter: Payer: Self-pay | Admitting: "Endocrinology

## 2023-08-09 ENCOUNTER — Ambulatory Visit: Payer: Medicare Other | Admitting: "Endocrinology

## 2023-08-09 VITALS — BP 130/66 | HR 72 | Ht 65.0 in | Wt 263.2 lb

## 2023-08-09 DIAGNOSIS — E038 Other specified hypothyroidism: Secondary | ICD-10-CM

## 2023-08-09 DIAGNOSIS — I10 Essential (primary) hypertension: Secondary | ICD-10-CM | POA: Diagnosis not present

## 2023-08-09 DIAGNOSIS — Z794 Long term (current) use of insulin: Secondary | ICD-10-CM

## 2023-08-09 DIAGNOSIS — E782 Mixed hyperlipidemia: Secondary | ICD-10-CM | POA: Diagnosis not present

## 2023-08-09 DIAGNOSIS — N183 Chronic kidney disease, stage 3 unspecified: Secondary | ICD-10-CM

## 2023-08-09 DIAGNOSIS — E559 Vitamin D deficiency, unspecified: Secondary | ICD-10-CM

## 2023-08-09 DIAGNOSIS — E1122 Type 2 diabetes mellitus with diabetic chronic kidney disease: Secondary | ICD-10-CM | POA: Diagnosis not present

## 2023-08-09 LAB — POCT GLYCOSYLATED HEMOGLOBIN (HGB A1C): HbA1c, POC (controlled diabetic range): 8.2 % — AB (ref 0.0–7.0)

## 2023-08-09 MED ORDER — INSULIN LISPRO 100 UNIT/ML IJ SOLN: 14.0000 [IU] | Freq: Three times a day (TID) | INTRAMUSCULAR | 2 refills | Status: DC

## 2023-08-09 MED ORDER — EMPAGLIFLOZIN 25 MG PO TABS: 25.0000 mg | ORAL_TABLET | Freq: Every day | ORAL | 1 refills | Status: AC

## 2023-08-09 MED ORDER — TRESIBA FLEXTOUCH 100 UNIT/ML ~~LOC~~ SOPN: 90.0000 [IU] | PEN_INJECTOR | Freq: Every day | SUBCUTANEOUS | 2 refills | Status: DC

## 2023-08-09 NOTE — Progress Notes (Signed)
08/09/2023  Endocrinology follow-up note   Subjective:    Patient ID: Tiffany Brock, female    DOB: 1945-08-09.  She is here for follow-up  for management of type 2 diabetes, hyperlipidemia, hypertension.  PCP: Richarda Blade E  Past Medical History:  Diagnosis Date   Anemia    Arthritis    Diabetes mellitus, type II (HCC)    Dyspnea    on exertion   Headache    Hyperlipidemia    Hypertension    Iron deficiency anemia with multiple negative GI work-ups question AVMs 06/28/2019   Pneumonia    h/o   PONV (postoperative nausea and vomiting)    Sleep apnea    Vertigo    Past Surgical History:  Procedure Laterality Date   ABDOMINAL HYSTERECTOMY     APPENDECTOMY     BLEPHAROPLASTY Bilateral 01/2018   CARPAL TUNNEL RELEASE Left 2008   COLONOSCOPY     multiple   ESOPHAGOGASTRODUODENOSCOPY     multiple   GIVENS CAPSULE STUDY     HEMORROIDECTOMY     UMBILICAL HERNIA REPAIR     Social History   Socioeconomic History   Marital status: Married    Spouse name: Not on file   Number of children: Not on file   Years of education: Not on file   Highest education level: Not on file  Occupational History   Not on file  Tobacco Use   Smoking status: Never   Smokeless tobacco: Never  Vaping Use   Vaping status: Never Used  Substance and Sexual Activity   Alcohol use: No   Drug use: No   Sexual activity: Not on file  Other Topics Concern   Not on file  Social History Narrative   Married and retired   Lives in [place], [place]   No hx EtOH/tobacco/drugs   Social Drivers of Corporate investment banker Strain: Low Risk  (02/18/2018)   Received from [facility], [facility]   Overall Financial Resource Strain (CARDIA)    Difficulty of Paying Living Expenses: Not hard at all  Food Insecurity: Unknown (02/18/2018)   Received from [facility], [facility]   Hunger Vital Sign    Worried About Running Out of Food in the Last Year: Patient declined    Ran Out  of Food in the Last Year: Patient declined  Transportation Needs: Unknown (02/18/2018)   Received from [facility], [facility]   [facility] - Transportation    Lack of Transportation (Medical): Patient declined    Lack of Transportation (Non-Medical): Patient declined  Physical Activity: Unknown (02/18/2018)   Received from [facility], [facility]   Exercise Vital Sign    Days of Exercise per Week: Patient declined    Minutes of Exercise per Session: Patient declined  Stress: No Stress Concern Present (02/18/2018)   Received from [facility], [facility] - Occupational Stress Questionnaire    Feeling of Stress : Not at all  Social Connections: Unknown (02/18/2018)   Received from [facility], [facility]   Social Connection and Isolation Panel [NHANES]    Frequency of Communication with Friends and Family: Patient declined    Frequency of Social Gatherings with Friends and Family: Patient declined    Attends Religious Services: Patient declined    Database administrator or Organizations: Patient declined    Attends Banker Meetings: Patient  declined    Marital Status: Patient declined   Outpatient Encounter Medications as of 08/09/2023  Medication Sig   insulin lispro (ADMELOG) 100 UNIT/ML injection Inject 0.14-0.2 mLs (14-20 Units total) into the skin 3 (three) times daily before meals.   albuterol (PROVENTIL HFA;VENTOLIN HFA) 108 (90 Base) MCG/ACT inhaler Inhale 2 puffs into the lungs every 6 (six) hours as needed for wheezing or shortness of breath.   Continuous Glucose Receiver (FREESTYLE LIBRE 3 READER) DEVI USE TO MONITOR GLUCOSE CONTINUOUSLY AS DIRECTED   Continuous Glucose Sensor (FREESTYLE LIBRE 3 SENSOR) MISC USE TO MONITOR GLUCOSE CONTINUOUSLY AS DIRECTED. CHANGE SENSOR EVERY 14 DAYS.   cyclobenzaprine (FLEXERIL) 10 MG tablet Take 1 tablet (10 mg total) by mouth 2 (two) times daily as needed  for muscle spasms.   empagliflozin (JARDIANCE) 25 MG TABS tablet Take 1 tablet (25 mg total) by mouth daily before breakfast.   esomeprazole (NEXIUM) 40 MG capsule Take 40 mg by mouth daily.   Ferrous Fumarate (HEMOCYTE - 106 MG FE) 324 (106 Fe) MG TABS tablet Take 1 tablet by mouth.   fluticasone (FLONASE) 50 MCG/ACT nasal spray    furosemide (LASIX) 40 MG tablet Take 20 mg by mouth 2 (two) times daily.   gabapentin (NEURONTIN) 300 MG capsule Take 300 mg by mouth at bedtime. 2 tablets at bedtime   insulin degludec (TRESIBA FLEXTOUCH) 100 UNIT/ML FlexTouch Pen Inject 90 Units into the skin at bedtime.   Insulin Pen Needle (B-D ULTRAFINE III SHORT PEN) 31G X 8 MM MISC 1 each by Other route See admin instructions. Use one pen needle four times daily to inject insulin   Insulin Syringe-Needle U-100 (BD INSULIN SYRINGE U/F) 31G X 5/16" 1 ML MISC USE TO INJECT INSULIN THREE TIMES DAILY   Insulin Syringes, Disposable, U-100 0.5 ML MISC Use to inject insulin three times daily   losartan-hydrochlorothiazide (HYZAAR) 50-12.5 MG tablet Take 1 tablet by mouth daily.   NON FORMULARY CPAP   PARoxetine (PAXIL) 10 MG tablet Take 10 mg by mouth daily.   simvastatin (ZOCOR) 40 MG tablet Take 40 mg by mouth daily.   Vitamin D, Ergocalciferol, (DRISDOL) 50000 units CAPS capsule Take 50,000 Units by mouth every 14 (fourteen) days.   [DISCONTINUED] ADMELOG 100 UNIT/ML injection INJECT 0.2-0.26 MLS (20-26 UNITS TOTAL) INTO THE SKIN 3 (THREE) TIMES DAILY WITH MEALS   [DISCONTINUED] empagliflozin (JARDIANCE) 10 MG TABS tablet Take 1 tablet (10 mg total) by mouth daily before breakfast.   [DISCONTINUED] insulin degludec (TRESIBA FLEXTOUCH) 100 UNIT/ML FlexTouch Pen Inject 90 Units into the skin at bedtime. (Patient taking differently: Inject 80 Units into the skin at bedtime.)   [DISCONTINUED] insulin lispro (ADMELOG SOLOSTAR) 100 UNIT/ML KwikPen Inject 14-20 Units into the skin 3 (three) times daily.   No  facility-administered encounter medications on file as of 08/09/2023.   ALLERGIES: Allergies  Allergen Reactions   Biaxin [Clarithromycin] Hives   VACCINATION STATUS: Immunization History  Administered Date(s) Administered   Moderna Sars-Covid-2 Vaccination 08/19/2019, 09/16/2019, 05/29/2020    Diabetes She presents for her follow-up diabetic visit. She has type 2 diabetes mellitus. Onset time: She was diagnosed at approximate age of 46 years. Her disease course has been worsening. There are no hypoglycemic associated symptoms. Pertinent negatives for hypoglycemia include no confusion, headaches, pallor or seizures. Associated symptoms include fatigue. Pertinent negatives for diabetes include no blurred vision, no chest pain, no polydipsia, no polyphagia and no polyuria. There are no hypoglycemic complications. Symptoms are worsening. Diabetic complications include  nephropathy. Risk factors for coronary artery disease include diabetes mellitus, dyslipidemia, family history, hypertension, obesity and sedentary lifestyle. Current diabetic treatment includes insulin injections and oral agent (dual therapy). Her weight is fluctuating minimally. She is following a generally unhealthy diet. When asked about meal planning, she reported none. She has had a previous visit with a dietitian. She never participates in exercise. Her home blood glucose trend is increasing steadily. Her breakfast blood glucose range is generally >200 mg/dl. Her lunch blood glucose range is generally >200 mg/dl. Her dinner blood glucose range is generally >200 mg/dl. Her bedtime blood glucose range is generally >200 mg/dl. Her overall blood glucose range is >200 mg/dl. ( Ms. Tiffany Brock presents with worsening glycemic profile.  Her CGM AGP shows 34% time range, 30% level 1 hyperglycemia, 34% level 2 hyperglycemia.  Her point-of-care A1c is 8.2%, increasing from 7.1%.  Her 14-day average for the most recent 2 weeks is 211 mg per DL.  She  did have 2% Level One hypoglycemia mostly between 9 and 10 AM and due to late breakfast.   ) An ACE inhibitor/angiotensin II receptor blocker is being taken. She sees a podiatrist.Eye exam is current.  Hyperlipidemia This is a chronic problem. The current episode started more than 1 year ago. The problem is controlled. Exacerbating diseases include diabetes and obesity. Pertinent negatives include no chest pain, myalgias or shortness of breath. Current antihyperlipidemic treatment includes statins. Risk factors for coronary artery disease include diabetes mellitus, dyslipidemia, family history, obesity, hypertension and a sedentary lifestyle.  Hypertension This is a chronic problem. The current episode started more than 1 year ago. Pertinent negatives include no blurred vision, chest pain, headaches, palpitations or shortness of breath. Risk factors for coronary artery disease include dyslipidemia, diabetes mellitus, family history, obesity and sedentary lifestyle. Past treatments include angiotensin blockers.    Review of systems  Constitutional: + Minimally fluctuating body weight,  current  Body mass index is 43.8 kg/m. , + fatigue, no subjective hyperthermia, no subjective hypothermia   Objective:    BP 130/66   Pulse 72   Ht 5\' 5"  (1.651 m)   Wt 263 lb 3.2 oz (119.4 kg)   BMI 43.80 kg/m   Wt Readings from Last 3 Encounters:  08/09/23 263 lb 3.2 oz (119.4 kg)  03/22/23 263 lb 3.2 oz (119.4 kg)  10/06/22 260 lb 12.8 oz (118.3 kg)     CMP     Component Value Date/Time   NA 140 03/15/2023 0000   K 3.9 03/15/2023 0000   CL 106 03/15/2023 0000   CO2 30 (A) 03/15/2023 0000   GLUCOSE 134 (H) 06/12/2021 1452   BUN 21 03/15/2023 0000   CREATININE 1.4 (A) 03/15/2023 0000   CREATININE 1.33 (H) 06/12/2021 1452   CALCIUM 8.9 03/15/2023 0000   ALBUMIN 3.2 (A) 03/15/2023 0000   AST 18 03/15/2023 0000   ALT 17 03/15/2023 0000   ALKPHOS 73 03/15/2023 0000   GFRNONAA 42 (L) 06/12/2021  1452   GFRAA 51 11/15/2020 0000   Diabetic Labs (most recent): Lab Results  Component Value Date   HGBA1C 8.2 (A) 08/09/2023   HGBA1C 7.9 (A) 03/22/2023   HGBA1C 7.1 (A) 10/06/2022   MICROALBUR 0.7 03/31/2018    Lipid Panel     Component Value Date/Time   CHOL 128 03/15/2023 0000   TRIG 77 03/15/2023 0000   HDL 44 03/15/2023 0000   LDLCALC 69 03/15/2023 0000     Assessment & Plan:   1. 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  Ms. Tiffany Brock presents with worsening glycemic profile.  Her CGM AGP shows 34% time range, 30% level 1 hyperglycemia, 34% level 2 hyperglycemia.  Her point-of-care A1c is 8.2%, increasing from 7.1%.  Her 14-day average for the most recent 2 weeks is 211 mg per DL.  She did have 2% Level One hypoglycemia mostly between 9 and 10 AM and due to late breakfast.    Her diabetes is complicated by  CKD, obesity , and sedentary life and patient remains at a high risk for more acute and chronic complications of diabetes which include CAD, CVA, CKD, retinopathy, and neuropathy. These are all discussed in detail with the patient.  - I have counseled the patient on diet management and weight loss, by adopting a carbohydrate restricted/protein rich diet.   - she acknowledges that there is a room for improvement in her food and drink choices. - Suggestion is made for her to avoid simple carbohydrates  from her diet including Cakes, Sweet Desserts, Ice Cream, Soda (diet and regular), Sweet Tea, Candies, Chips, Cookies, Store Bought Juices, Alcohol in Excess of  1-2 drinks a day, Artificial Sweeteners,  Coffee Creamer, and "Sugar-free" Products, Lemonade. This will help patient to have more stable blood glucose profile and potentially avoid unintended weight gain.  - I encouraged the patient to switch to  unprocessed or minimally processed complex starch and increased protein intake (animal or plant source), fruits, and vegetables.  - Patient is advised to stick to a routine mealtimes to eat 3 meals  a day and avoid unnecessary snacks ( to snack only to correct hypoglycemia).   - I have approached patient with the following individualized plan to manage diabetes and patient agrees:   -In light of her presentation with loss of control, she will need adjustment in her insulin, still needs basal/bolus insulin in order for her to control diabetes to target.     -Accordingly, she is advised to increase Tresiba to 90 units nightly, advised to continue Humalog 14-20 units 3 times a day before meals for pre-meal blood glucose above 90 mg/dL associated with strict monitoring of glucose 4 times a day-before meals and at bedtime. She is benefiting and advised to use her CGM at all times. -She tolerated Jardiance instead of Rybelsus.  I discussed and increase Jardiance to 25 mg p.o. daily at breakfast.    -She would benefit from this medication also from CKD point of view.  Side effects and precautions discussed with her.  -Patient is encouraged to call clinic for blood glucose levels less than 70 or above 200 mg /dl. -She is advised to continue to utilize her CGM. -Her renal function is still abnormal, however stable.   - Patient specific target  A1c;  LDL, HDL, Triglycerides, were discussed in detail.  2) BP/HTN:  -Her blood pressure is controlled to target.   -She did get her blood pressure medications adjusted by her nephrologist currently on furosemide 40 mg p.o. daily, spironolactone 50 mg p.o. daily, as well as Hyzaar 50/12.5 mg p.o. daily.    3) Lipids/HPL: Her recent lipid panel showed controlled LDL at 69.  She is advised to continue simvastatin 40 mg p.o. nightly.   Side effects and precautions discussed with her.     4) subclinical hypothyroidism: Resolved.  Her subsequent  full set of  thyroid function tests are within normal limits.   She will not need intervention at this time.  She will need repeat thyroid function test after her next visit.    5) obesity: Her BMI is 43.80- - clearly complicating her diabetes care.  She is a candidate for modest weight loss.   Whole food plant-based diet was discussed and recommended to her.  6) Chronic Care/Health Maintenance:  -Patient is on ARB and Statin medications and encouraged to continue to follow up with Ophthalmology, Podiatrist at least yearly or according to recommendations, and advised to   stay away from smoking. I have recommended yearly flu vaccine and pneumonia vaccination at least every 5 years; and  sleep for at least 7 hours a day.   Patient cannot exercise optimally.    - I advised patient to maintain close follow up with Richarda Blade E for primary care needs.  I spent  41  minutes in the care of the patient today including review of labs from CMP, Lipids, Thyroid Function, Hematology (current and previous including abstractions from other facilities); face-to-face time discussing  her blood glucose readings/logs, discussing hypoglycemia and hyperglycemia episodes and symptoms, medications doses, her options of short and long term treatment based on the latest standards of care / guidelines;  discussion about incorporating lifestyle medicine;  and documenting the encounter. Risk reduction counseling performed per USPSTF guidelines to reduce  obesity and cardiovascular risk factors.     Please refer to Patient Instructions for Blood Glucose Monitoring and Insulin/Medications Dosing Guide"  in media tab for additional information. Please  also refer to " Patient Self Inventory" in the Media  tab for reviewed elements of pertinent patient history.  Estel Tonelli Hazzard participated in the discussions, expressed understanding, and voiced agreement with the above plans.  All questions were answered to her satisfaction. she is  encouraged to contact clinic should she have any questions or concerns prior to her return visit.    Follow up plan: - Return in about 4 months (around 12/07/2023) for F/U with Pre-visit Labs, Meter/CGM/Logs, A1c here.  Marquis Lunch, MD Phone: [phone]  Fax: [phone]  This note was partially dictated with voice recognition software. Similar sounding words can be transcribed inadequately or may not  be corrected upon review.  08/09/2023, 12:43 PM

## 2023-08-09 NOTE — Patient Instructions (Signed)

## 2023-09-30 ENCOUNTER — Other Ambulatory Visit: Payer: Self-pay | Admitting: "Endocrinology

## 2023-12-03 LAB — LAB REPORT - SCANNED
EGFR: 34
Free T4: 1.11 ng/dL
TSH: 4.57 (ref 0.41–5.90)

## 2023-12-03 LAB — LIPID PANEL
Cholesterol: 135 (ref 0–200)
HDL: 42 (ref 35–70)
LDL Cholesterol: 70
Triglycerides: 115 (ref 40–160)

## 2023-12-03 LAB — BASIC METABOLIC PANEL WITH GFR
BUN: 24 — AB (ref 4–21)
Creatinine: 1.6 — AB (ref 0.5–1.1)

## 2023-12-10 ENCOUNTER — Encounter: Payer: Self-pay | Admitting: "Endocrinology

## 2023-12-10 ENCOUNTER — Ambulatory Visit: Payer: Medicare Other | Admitting: "Endocrinology

## 2023-12-10 VITALS — BP 104/52 | HR 60 | Ht 65.0 in | Wt 263.0 lb

## 2023-12-10 DIAGNOSIS — E782 Mixed hyperlipidemia: Secondary | ICD-10-CM | POA: Diagnosis not present

## 2023-12-10 DIAGNOSIS — N183 Chronic kidney disease, stage 3 unspecified: Secondary | ICD-10-CM

## 2023-12-10 DIAGNOSIS — Z794 Long term (current) use of insulin: Secondary | ICD-10-CM | POA: Diagnosis not present

## 2023-12-10 DIAGNOSIS — E1122 Type 2 diabetes mellitus with diabetic chronic kidney disease: Secondary | ICD-10-CM

## 2023-12-10 DIAGNOSIS — I10 Essential (primary) hypertension: Secondary | ICD-10-CM

## 2023-12-10 LAB — POCT GLYCOSYLATED HEMOGLOBIN (HGB A1C): HbA1c, POC (controlled diabetic range): 8 % — AB (ref 0.0–7.0)

## 2023-12-10 MED ORDER — TIRZEPATIDE 2.5 MG/0.5ML ~~LOC~~ SOAJ: 2.5000 mg | SUBCUTANEOUS | 1 refills | Status: DC

## 2023-12-10 MED ORDER — TRESIBA FLEXTOUCH 100 UNIT/ML ~~LOC~~ SOPN: 80.0000 [IU] | PEN_INJECTOR | Freq: Every day | SUBCUTANEOUS | 2 refills | Status: DC

## 2023-12-10 MED ORDER — INSULIN LISPRO 100 UNIT/ML IJ SOLN: 10.0000 [IU] | Freq: Three times a day (TID) | INTRAMUSCULAR | 2 refills | Status: DC

## 2023-12-10 NOTE — Patient Instructions (Signed)

## 2023-12-10 NOTE — Progress Notes (Signed)
 12/10/2023  Endocrinology follow-up note   Subjective:    Patient ID: Tiffany Brock, female    DOB: [birth year].  She is here for follow-up  for management of type 2 diabetes, hyperlipidemia, hypertension.  PCP: Sydelle Evan E  Past Medical History:  Diagnosis Date   Anemia    Arthritis    Diabetes mellitus, type II (HCC)    Dyspnea    on exertion   Headache    Hyperlipidemia    Hypertension    Iron deficiency anemia with multiple negative GI work-ups question AVMs 06/28/2019   Pneumonia    h/o   PONV (postoperative nausea and vomiting)    Sleep apnea    Vertigo    Past Surgical History:  Procedure Laterality Date   ABDOMINAL HYSTERECTOMY     APPENDECTOMY     BLEPHAROPLASTY Bilateral 01/2018   CARPAL TUNNEL RELEASE Left 2008   COLONOSCOPY     multiple   ESOPHAGOGASTRODUODENOSCOPY     multiple   GIVENS CAPSULE STUDY     HEMORROIDECTOMY     UMBILICAL HERNIA REPAIR     Social History   Socioeconomic History   Marital status: Married    Spouse name: Not on file   Number of children: Not on file   Years of education: Not on file   Highest education level: Not on file  Occupational History   Not on file  Tobacco Use   Smoking status: Never   Smokeless tobacco: Never  Vaping Use   Vaping status: Never Used  Substance and Sexual Activity   Alcohol use: No   Drug use: No   Sexual activity: Not on file  Other Topics Concern   Not on file  Social History Narrative   Married and retired   Lives in [place], [place]   No hx EtOH/tobacco/drugs   Social Drivers of Corporate investment banker Strain: Low Risk  (02/18/2018)   Received from [facility]   Overall Financial Resource Strain (CARDIA)    Difficulty of Paying Living Expenses: Not hard at all  Food Insecurity: Unknown (02/18/2018)   Received from [facility]   Hunger Vital Sign    Worried About Running Out of Food in the Last Year: Patient declined    Ran Out of Food in the Last Year: Patient  declined  Transportation Needs: Unknown (02/18/2018)   Received from [facility] - Transportation    Lack of Transportation (Medical): Patient declined    Lack of Transportation (Non-Medical): Patient declined  Physical Activity: Unknown (02/18/2018)   Received from [facility]   Exercise Vital Sign    Days of Exercise per Week: Patient declined    Minutes of Exercise per Session: Patient declined  Stress: No Stress Concern Present (02/18/2018)   Received from [facility] - Occupational Stress Questionnaire    Feeling of Stress : Not at all  Social Connections: Unknown (02/18/2018)   Received from [facility]   Social Connection and Isolation Panel    Frequency of Communication with Friends and Family: Patient declined    Frequency of Social Gatherings with Friends and Family: Patient declined    Attends Religious Services: Patient declined    Active Member of Clubs or Organizations: Patient declined    Attends Banker Meetings: Patient declined    Marital Status: Patient declined   Outpatient Encounter Medications as of 12/10/2023  Medication Sig  cefdinir (OMNICEF) 300 MG capsule Take 300 mg by mouth every 12 (twelve) hours.   tirzepatide (MOUNJARO) 2.5 MG/0.5ML Pen Inject 2.5 mg into the skin once a week.   [DISCONTINUED] insulin  degludec (TRESIBA  FLEXTOUCH) 100 UNIT/ML FlexTouch Pen INJECT 90 UNITS INTO THE SKIN AT BEDTIME (Patient taking differently: Inject 80-90 Units into the skin at bedtime.)   albuterol  (PROVENTIL  HFA;VENTOLIN  HFA) 108 (90 Base) MCG/ACT inhaler Inhale 2 puffs into the lungs every 6 (six) hours as needed for wheezing or shortness of breath.   Continuous Glucose Receiver (FREESTYLE LIBRE 3 READER) DEVI USE TO MONITOR GLUCOSE CONTINUOUSLY AS DIRECTED   Continuous Glucose Sensor (FREESTYLE LIBRE 3 SENSOR) MISC USE TO MONITOR GLUCOSE CONTINUOUSLY AS DIRECTED. CHANGE SENSOR EVERY 14 DAYS.    cyclobenzaprine  (FLEXERIL ) 10 MG tablet Take 1 tablet (10 mg total) by mouth 2 (two) times daily as needed for muscle spasms.   empagliflozin  (JARDIANCE ) 25 MG TABS tablet Take 1 tablet (25 mg total) by mouth daily before breakfast.   esomeprazole (NEXIUM) 40 MG capsule Take 40 mg by mouth daily.   Ferrous Fumarate (HEMOCYTE - 106 MG FE) 324 (106 Fe) MG TABS tablet Take 1 tablet by mouth.   fluticasone (FLONASE) 50 MCG/ACT nasal spray    furosemide (LASIX) 40 MG tablet Take 20 mg by mouth 2 (two) times daily.   gabapentin (NEURONTIN) 300 MG capsule Take 300 mg by mouth at bedtime. 2 tablets at bedtime   insulin  degludec (TRESIBA  FLEXTOUCH) 100 UNIT/ML FlexTouch Pen Inject 80 Units into the skin at bedtime.   insulin  lispro (ADMELOG ) 100 UNIT/ML injection Inject 0.1-0.16 mLs (10-16 Units total) into the skin 3 (three) times daily before meals.   Insulin  Pen Needle (B-D ULTRAFINE III SHORT PEN) 31G X 8 MM MISC 1 each by Other route See admin instructions. Use one pen needle four times daily to inject insulin    Insulin  Syringe-Needle U-100 (BD INSULIN  SYRINGE U/F) 31G X 5/16 1 ML MISC USE TO INJECT INSULIN  THREE TIMES DAILY   Insulin  Syringes, Disposable, U-100 0.5 ML MISC Use to inject insulin  three times daily   losartan -hydrochlorothiazide  (HYZAAR) 50-12.5 MG tablet Take 1 tablet by mouth daily.   NON FORMULARY CPAP   PARoxetine  (PAXIL ) 10 MG tablet Take 10 mg by mouth daily.   simvastatin  (ZOCOR ) 40 MG tablet Take 40 mg by mouth daily.   Vitamin D , Ergocalciferol , (DRISDOL) 50000 units CAPS capsule Take 50,000 Units by mouth every 14 (fourteen) days.   [DISCONTINUED] insulin  lispro (ADMELOG ) 100 UNIT/ML injection Inject 0.14-0.2 mLs (14-20 Units total) into the skin 3 (three) times daily before meals.   No facility-administered encounter medications on file as of 12/10/2023.   ALLERGIES: Allergies  Allergen Reactions   Biaxin [Clarithromycin] Hives   VACCINATION STATUS: Immunization  History  Administered Date(s) Administered   Moderna Sars-Covid-2 Vaccination 08/19/2019, 09/16/2019, 05/29/2020    Diabetes She presents for her follow-up diabetic visit. She has type 2 diabetes mellitus. Onset time: She was diagnosed at approximate age of 46 years. Her disease course has been worsening. There are no hypoglycemic associated symptoms. Pertinent negatives for hypoglycemia include no confusion, headaches, pallor or seizures. Associated symptoms include fatigue. Pertinent negatives for diabetes include no blurred vision, no chest pain, no polydipsia, no polyphagia and no polyuria. There are no hypoglycemic complications. Symptoms are worsening. Diabetic complications include nephropathy. Risk factors for coronary artery disease include diabetes mellitus, dyslipidemia, family history, hypertension, obesity and sedentary lifestyle. Current diabetic treatment includes insulin  injections and oral agent (  dual therapy). Her weight is fluctuating minimally. She is following a generally unhealthy diet. When asked about meal planning, she reported none. She has had a previous visit with a dietitian. She never participates in exercise. Her home blood glucose trend is fluctuating dramatically. Her breakfast blood glucose range is generally 180-200 mg/dl. Her lunch blood glucose range is generally 180-200 mg/dl. Her dinner blood glucose range is generally 180-200 mg/dl. Her bedtime blood glucose range is generally 180-200 mg/dl. Her overall blood glucose range is 180-200 mg/dl. ( Tiffany Brock presents with fluctuating glycemic profile.  Her freestyle libre device was analyzed and AGP shows 44% time in range, 35% Level 1 hyperglycemia, 17% level 2 hyperglycemia.  She has 2% Libre 1 hypoglycemia, 2% level 2 hypoglycemia.  She does not follow the meal routine.  She is often late for breakfast, and eats late including regular evening snacks.  Her point-of-care A1c is 8%.  ) An ACE inhibitor/angiotensin II  receptor blocker is being taken. She sees a podiatrist.Eye exam is current.  Hyperlipidemia This is a chronic problem. The current episode started more than 1 year ago. The problem is controlled. Exacerbating diseases include diabetes and obesity. Pertinent negatives include no chest pain, myalgias or shortness of breath. Current antihyperlipidemic treatment includes statins. Risk factors for coronary artery disease include diabetes mellitus, dyslipidemia, family history, obesity, hypertension and a sedentary lifestyle.  Hypertension This is a chronic problem. The current episode started more than 1 year ago. Pertinent negatives include no blurred vision, chest pain, headaches, palpitations or shortness of breath. Risk factors for coronary artery disease include dyslipidemia, diabetes mellitus, family history, obesity and sedentary lifestyle. Past treatments include angiotensin blockers.    Review of systems  Constitutional: + Minimally fluctuating body weight,  current  Body mass index is 43.77 kg/m. , + fatigue, no subjective hyperthermia, no subjective hypothermia   Objective:    BP (!) 104/52   Pulse 60   Ht 5' 5 (1.651 m)   Wt 263 lb (119.3 kg)   BMI 43.77 kg/m   Wt Readings from Last 3 Encounters:  12/10/23 263 lb (119.3 kg)  08/09/23 263 lb 3.2 oz (119.4 kg)  03/22/23 263 lb 3.2 oz (119.4 kg)     CMP     Component Value Date/Time   NA 140 03/15/2023 0000   K 3.9 03/15/2023 0000   CL 106 03/15/2023 0000   CO2 30 (A) 03/15/2023 0000   GLUCOSE 134 (H) 06/12/2021 1452   BUN 24 (A) 12/03/2023 0000   CREATININE 1.6 (A) 12/03/2023 0000   CREATININE 1.33 (H) 06/12/2021 1452   CALCIUM 8.9 03/15/2023 0000   ALBUMIN  3.2 (A) 03/15/2023 0000   AST 18 03/15/2023 0000   ALT 17 03/15/2023 0000   ALKPHOS 73 03/15/2023 0000   GFRNONAA 42 (L) 06/12/2021 1452   GFRAA 51 11/15/2020 0000   Diabetic Labs (most recent): Lab Results  Component Value Date   HGBA1C 8.0 (A) 12/10/2023    HGBA1C 8.2 (A) 08/09/2023   HGBA1C 7.9 (A) 03/22/2023   MICROALBUR 0.7 03/31/2018    Lipid Panel     Component Value Date/Time   CHOL 135 12/03/2023 0000   TRIG 115 12/03/2023 0000   HDL 42 12/03/2023 0000   LDLCALC 70 12/03/2023 0000     Assessment & Plan:   1. 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  Tiffany Brock presents with fluctuating glycemic profile.  Her freestyle libre device was analyzed and AGP shows 44% time in range, 35% Level 1 hyperglycemia, 17% level 2 hyperglycemia.  She has 2% Libre 1 hypoglycemia, 2% level 2 hypoglycemia.  She does not follow the meal routine.  She is often late for breakfast, and eats late including regular evening snacks.  Her point-of-care A1c is 8%.    Her diabetes is complicated by  CKD, obesity , and sedentary life and patient remains at a high risk for more acute and chronic complications of diabetes which include CAD, CVA, CKD, retinopathy, and neuropathy. These are all discussed in detail with the patient.  - I have counseled the patient on diet management and weight loss, by adopting a carbohydrate restricted/protein rich diet.  - she acknowledges that there is a room for improvement in her food and drink choices. - Suggestion is made for her to avoid simple carbohydrates  from her diet including Cakes, Sweet Desserts, Ice Cream, Soda (diet and regular), Sweet Tea, Candies, Chips, Cookies, Store Bought Juices, Alcohol in Excess of  1-2 drinks a day, Artificial Sweeteners,  Coffee Creamer, and Sugar-free Products, Lemonade. This will help patient to have more stable blood glucose profile and potentially avoid unintended weight gain.   - I encouraged the patient to switch to unprocessed or minimally processed complex starch and increased protein intake (animal or plant source),  fruits, and vegetables.  - Patient is advised to stick to a routine mealtimes to eat 3 meals  a day and avoid unnecessary snacks ( to snack only to correct hypoglycemia).   - I have approached patient with the following individualized plan to manage diabetes and patient agrees:   -she will continue to need to have multiple daily injections of insulin  in order for her to achieve control of diabetes to target.    - Will go she did not previously tolerate semaglutide , she is asking for Mounjaro.  Accordingly, I discussed in prescribed Mounjaro 2.5 mg subcutaneously weekly.   - To avoid inadvertent hypoglycemia, she is advised to lower her Tresiba  to 80 units nightly, advised to lower Humalog  to 10 -16  units 3 times a day before meals for pre-meal blood glucose above 90 mg/dL associated with strict monitoring of glucose 4 times a day-before meals and at bedtime. She is benefiting and advised to use her CGM at all times. - Advised to continue Jardiance  25 mg p.o. daily at breakfast.   She would benefit from this medication from CKD point of view.  Side effects and precautions discussed with her.  -Patient is encouraged to call clinic for blood glucose levels less than 70 or above 200 mg /dl. -She is clearly benefiting from her CGM, advised to continue to use her CGM at all times.    - Patient specific target  A1c;  LDL, HDL, Triglycerides, were discussed in detail.  2) BP/HTN:  Her blood pressure is controlled to target.   -She did get her blood pressure medications adjusted by her nephrologist currently on furosemide 40 mg p.o. daily, spironolactone 50 mg p.o. daily, as well as Hyzaar 50/12.5 mg p.o. daily.    3) Lipids/HPL: Her recent lipid panel showed controlled LDL at 68.  She is advised to continue simvastatin  40 mg p.o. nightly.  Side effects and precautions discussed with her.      4) subclinical hypothyroidism: She continues to have slightly above target TSH with normal free T4.  She  would not need intervention at this time.   She will need repeat thyroid function test after her next visit.    5) obesity: Her BMI is 43.77- - clearly complicating her diabetes care.  She is a candidate for modest weight loss.   Whole food plant-based diet was discussed and recommended to her.  6) Chronic Care/Health Maintenance:  -Patient is on ARB and Statin medications and encouraged to continue to follow up with Ophthalmology, Podiatrist at least yearly or according to recommendations, and advised to   stay away from smoking. I have recommended yearly flu vaccine and pneumonia vaccination at least every 5 years; and  sleep for at least 7 hours a day.   Patient cannot exercise optimally.    - I advised patient to maintain close follow up with Elliott, Dianne E for primary care needs.   I spent  42  minutes in the care of the patient today including review of labs from CMP, Lipids, Thyroid Function, Hematology (current and previous including abstractions from other facilities); face-to-face time discussing  her blood glucose readings/logs, discussing hypoglycemia and hyperglycemia episodes and symptoms, medications doses, her options of short and long term treatment based on the latest standards of care / guidelines;  discussion about incorporating lifestyle medicine;  and documenting the encounter. Risk reduction counseling performed per USPSTF guidelines to reduce  obesity and cardiovascular risk factors.     Please refer to Patient Instructions for Blood Glucose Monitoring and Insulin /Medications Dosing Guide  in media tab for additional information. Please  also refer to  Patient Self Inventory in the Media  tab for reviewed elements of pertinent patient history.  Signa Cheek Sabbagh participated in the discussions, expressed understanding, and voiced agreement with the above plans.  All questions were answered to her satisfaction. she is encouraged to contact clinic should she have any  questions or concerns prior to her return visit.   Follow up plan: - Return in about 3 months (around 03/11/2024) for Bring Meter/CGM Device/Logs- A1c in Office.  Kalvin Orf, MD Phone: [phone]  Fax: [phone]  This note was partially dictated with voice recognition software. Similar sounding words can be transcribed inadequately or may not  be corrected upon review.  12/10/2023, 11:25 AM

## 2023-12-29 ENCOUNTER — Telehealth: Payer: Self-pay

## 2023-12-29 NOTE — Telephone Encounter (Signed)
 Tried to return pt's phone call, did not receive an answer and was unable to leave a message.

## 2024-01-11 ENCOUNTER — Other Ambulatory Visit: Payer: Self-pay | Admitting: "Endocrinology

## 2024-01-26 ENCOUNTER — Other Ambulatory Visit: Payer: Self-pay | Admitting: "Endocrinology

## 2024-01-26 DIAGNOSIS — E1122 Type 2 diabetes mellitus with diabetic chronic kidney disease: Secondary | ICD-10-CM

## 2024-04-10 ENCOUNTER — Encounter: Payer: Self-pay | Admitting: "Endocrinology

## 2024-04-10 ENCOUNTER — Ambulatory Visit: Admitting: "Endocrinology

## 2024-04-10 VITALS — BP 120/64 | HR 68 | Ht 65.0 in | Wt 256.8 lb

## 2024-04-10 DIAGNOSIS — E782 Mixed hyperlipidemia: Secondary | ICD-10-CM | POA: Diagnosis not present

## 2024-04-10 DIAGNOSIS — Z794 Long term (current) use of insulin: Secondary | ICD-10-CM

## 2024-04-10 DIAGNOSIS — N183 Chronic kidney disease, stage 3 unspecified: Secondary | ICD-10-CM

## 2024-04-10 DIAGNOSIS — E1122 Type 2 diabetes mellitus with diabetic chronic kidney disease: Secondary | ICD-10-CM

## 2024-04-10 DIAGNOSIS — I10 Essential (primary) hypertension: Secondary | ICD-10-CM

## 2024-04-10 LAB — POCT GLYCOSYLATED HEMOGLOBIN (HGB A1C): HbA1c, POC (controlled diabetic range): 8 % — AB (ref 0.0–7.0)

## 2024-04-10 MED ORDER — TRESIBA FLEXTOUCH 100 UNIT/ML ~~LOC~~ SOPN: 70.0000 [IU] | PEN_INJECTOR | Freq: Every day | SUBCUTANEOUS | 2 refills | Status: AC

## 2024-04-10 MED ORDER — TIRZEPATIDE 5 MG/0.5ML ~~LOC~~ SOAJ: 5.0000 mg | SUBCUTANEOUS | 0 refills | Status: AC

## 2024-04-10 NOTE — Patient Instructions (Signed)

## 2024-04-10 NOTE — Progress Notes (Signed)
 04/10/2024  Endocrinology follow-up note   Subjective:    Patient ID: Tiffany Brock, female    DOB: Oct 29, 1945.  She is here for follow-up  for management of type 2 diabetes, hyperlipidemia, hypertension.  PCP: Viktoria Clunes E  Past Medical History:  Diagnosis Date   Anemia    Arthritis    Diabetes mellitus, type II (HCC)    Dyspnea    on exertion   Headache    Hyperlipidemia    Hypertension    Iron deficiency anemia with multiple negative GI work-ups question AVMs 06/28/2019   Pneumonia    h/o   PONV (postoperative nausea and vomiting)    Sleep apnea    Vertigo    Past Surgical History:  Procedure Laterality Date   ABDOMINAL HYSTERECTOMY     APPENDECTOMY     BLEPHAROPLASTY Bilateral 01/2018   CARPAL TUNNEL RELEASE Left 2008   COLONOSCOPY     multiple   ESOPHAGOGASTRODUODENOSCOPY     multiple   GIVENS CAPSULE STUDY     HEMORROIDECTOMY     UMBILICAL HERNIA REPAIR     Social History   Socioeconomic History   Marital status: Married    Spouse name: Not on file   Number of children: Not on file   Years of education: Not on file   Highest education level: Not on file  Occupational History   Not on file  Tobacco Use   Smoking status: Never   Smokeless tobacco: Never  Vaping Use   Vaping status: Never Used  Substance and Sexual Activity   Alcohol use: No   Drug use: No   Sexual activity: Not on file  Other Topics Concern   Not on file  Social History Narrative   Married and retired   Lives in [place], [place]   No hx EtOH/tobacco/drugs   Social Drivers of Corporate investment banker Strain: Low Risk  (02/18/2018)   Received from [facility]   Overall Financial Resource Strain (CARDIA)    Difficulty of Paying Living Expenses: Not hard at all  Food Insecurity: Unknown (02/18/2018)   [facility]   Hunger Vital Sign    Worried About Running Out of Food in the Last Year: Patient declined    Ran Out of Food in the Last Year:  Patient declined  Transportation Needs: Unknown (02/18/2018)   Received from [facility] - Transportation    Lack of Transportation (Medical): Patient declined    Lack of Transportation (Non-Medical): Patient declined  Physical Activity: Unknown (02/18/2018)   Received from [facility]   Exercise Vital Sign    Days of Exercise per Week: Patient declined    Minutes of Exercise per Session: Patient declined  Stress: No Stress Concern Present (02/18/2018)   Received from [facility] - Occupational Stress Questionnaire    Feeling of Stress : Not at all  Social Connections: Unknown (02/18/2018)   Received from [facility]   Social Connection and Isolation Panel    Frequency of Communication with Friends and Family: Patient declined    Frequency of Social Gatherings with Friends and Family: Patient declined    Attends Religious Services: Patient declined    Active Member of Clubs or Organizations: Patient declined    Attends Banker Meetings: Patient declined    Marital Status: Patient declined   Outpatient Encounter Medications as of 04/10/2024  Medication Sig  tirzepatide  (MOUNJARO ) 5 MG/0.5ML Pen Inject 5 mg into the skin once a week.   [DISCONTINUED] insulin  degludec (TRESIBA  FLEXTOUCH) 100 UNIT/ML FlexTouch Pen INJECT 90 UNITS INTO THE SKIN AT BEDTIME (Patient taking differently: Inject 80 Units into the skin at bedtime.)   albuterol  (PROVENTIL  HFA;VENTOLIN  HFA) 108 (90 Base) MCG/ACT inhaler Inhale 2 puffs into the lungs every 6 (six) hours as needed for wheezing or shortness of breath.   cefdinir (OMNICEF) 300 MG capsule Take 300 mg by mouth every 12 (twelve) hours.   Continuous Glucose Receiver (FREESTYLE LIBRE 3 READER) DEVI USE TO MONITOR GLUCOSE CONTINUOUSLY AS DIRECTED   Continuous Glucose Sensor (FREESTYLE LIBRE 3 SENSOR) MISC USE TO MONITOR GLUCOSE CONTINUOUSLY AS DIRECTED. CHANGE SENSOR EVERY 14  DAYS.   cyclobenzaprine  (FLEXERIL ) 10 MG tablet Take 1 tablet (10 mg total) by mouth 2 (two) times daily as needed for muscle spasms.   empagliflozin  (JARDIANCE ) 25 MG TABS tablet Take 1 tablet (25 mg total) by mouth daily before breakfast. (Patient not taking: Reported on 04/10/2024)   esomeprazole (NEXIUM) 40 MG capsule Take 40 mg by mouth daily.   Ferrous Fumarate (HEMOCYTE - 106 MG FE) 324 (106 Fe) MG TABS tablet Take 1 tablet by mouth.   fluticasone (FLONASE) 50 MCG/ACT nasal spray    furosemide (LASIX) 40 MG tablet Take 20 mg by mouth 2 (two) times daily.   gabapentin (NEURONTIN) 300 MG capsule Take 300 mg by mouth at bedtime. 2 tablets at bedtime   insulin  degludec (TRESIBA  FLEXTOUCH) 100 UNIT/ML FlexTouch Pen Inject 70 Units into the skin at bedtime.   Insulin  Pen Needle (EMBECTA PEN NEEDLE ULTRAFINE) 31G X 8 MM MISC 1 EACH BY OTHER ROUTE SEE ADMIN INSTRUCTIONS. USE ONE PEN NEEDLE FOUR TIMES DAILY TO INJECT INSULIN    Insulin  Syringe-Needle U-100 (BD INSULIN  SYRINGE U/F) 31G X 5/16 1 ML MISC USE TO INJECT INSULIN  THREE TIMES DAILY   Insulin  Syringes, Disposable, U-100 0.5 ML MISC Use to inject insulin  three times daily   losartan -hydrochlorothiazide  (HYZAAR) 50-12.5 MG tablet Take 1 tablet by mouth daily.   NON FORMULARY CPAP   PARoxetine  (PAXIL ) 10 MG tablet Take 10 mg by mouth daily.   simvastatin  (ZOCOR ) 40 MG tablet Take 40 mg by mouth daily.   Vitamin D , Ergocalciferol , (DRISDOL) 50000 units CAPS capsule Take 50,000 Units by mouth every 14 (fourteen) days.   [DISCONTINUED] insulin  lispro (ADMELOG ) 100 UNIT/ML injection Inject 0.1-0.16 mLs (10-16 Units total) into the skin 3 (three) times daily before meals.   [DISCONTINUED] tirzepatide  (MOUNJARO ) 2.5 MG/0.5ML Pen Inject 2.5 mg into the skin once a week.   No facility-administered encounter medications on file as of 04/10/2024.   ALLERGIES: Allergies  Allergen Reactions   Biaxin [Clarithromycin] Hives   VACCINATION  STATUS: Immunization History  Administered Date(s) Administered   Moderna Sars-Covid-2 Vaccination 08/19/2019, 09/16/2019, 05/29/2020    Diabetes She presents for her follow-up diabetic visit. She has type 2 diabetes mellitus. Onset time: She was diagnosed at approximate age of 46 years. Her disease course has been fluctuating. There are no hypoglycemic associated symptoms. Pertinent negatives for hypoglycemia include no confusion, headaches, pallor or seizures. Associated symptoms include fatigue. Pertinent negatives for diabetes include no blurred vision, no chest pain, no polydipsia, no polyphagia and no polyuria. There are no hypoglycemic complications. Symptoms are worsening. Diabetic complications include nephropathy. Risk factors for coronary artery disease include diabetes mellitus, dyslipidemia, family history, hypertension, obesity and sedentary lifestyle. Current diabetic treatment includes insulin  injections and oral agent (dual  therapy). Her weight is decreasing steadily. She is following a generally unhealthy diet. When asked about meal planning, she reported none. She has had a previous visit with a dietitian. She never participates in exercise. Her home blood glucose trend is fluctuating minimally. Her breakfast blood glucose range is generally 140-180 mg/dl. Her lunch blood glucose range is generally 140-180 mg/dl. Her dinner blood glucose range is generally 140-180 mg/dl. Her bedtime blood glucose range is generally 140-180 mg/dl. Her overall blood glucose range is 140-180 mg/dl. ( Ms. Dwain presents with her CGM showing recent improvement in her glycemic profile averaging 159 mg per DL.  Her GMI suggests estimated A1c of 7.1% with glucose variability of 32.0%.  Her AGP shows 61% time in range, 31% level 1 hyperglycemia, 4% level 2 hyperglycemia.  She did have 2% level 1 hypoglycemia and 2% level 2 hypoglycemia.     ) An ACE inhibitor/angiotensin II receptor blocker is being taken. She  sees a podiatrist.Eye exam is current.  Hyperlipidemia This is a chronic problem. The current episode started more than 1 year ago. The problem is controlled. Exacerbating diseases include diabetes and obesity. Pertinent negatives include no chest pain, myalgias or shortness of breath. Current antihyperlipidemic treatment includes statins. Risk factors for coronary artery disease include diabetes mellitus, dyslipidemia, family history, obesity, hypertension and a sedentary lifestyle.  Hypertension This is a chronic problem. The current episode started more than 1 year ago. Pertinent negatives include no blurred vision, chest pain, headaches, palpitations or shortness of breath. Risk factors for coronary artery disease include dyslipidemia, diabetes mellitus, family history, obesity and sedentary lifestyle. Past treatments include angiotensin blockers.    Review of systems  Constitutional: + Minimally fluctuating body weight,  current  Body mass index is 42.73 kg/m. , + fatigue, no subjective hyperthermia, no subjective hypothermia   Objective:    BP 120/64   Pulse 68   Ht 5' 5 (1.651 m)   Wt 256 lb 12.8 oz (116.5 kg)   BMI 42.73 kg/m   Wt Readings from Last 3 Encounters:  04/10/24 256 lb 12.8 oz (116.5 kg)  12/10/23 263 lb (119.3 kg)  08/09/23 263 lb 3.2 oz (119.4 kg)     CMP     Component Value Date/Time   NA 140 03/15/2023 0000   K 3.9 03/15/2023 0000   CL 106 03/15/2023 0000   CO2 30 (A) 03/15/2023 0000   GLUCOSE 134 (H) 06/12/2021 1452   BUN 24 (A) 12/03/2023 0000   CREATININE 1.6 (A) 12/03/2023 0000   CREATININE 1.33 (H) 06/12/2021 1452   CALCIUM 8.9 03/15/2023 0000   ALBUMIN  3.2 (A) 03/15/2023 0000   AST 18 03/15/2023 0000   ALT 17 03/15/2023 0000   ALKPHOS 73 03/15/2023 0000   GFRNONAA 42 (L) 06/12/2021 1452   GFRAA 51 11/15/2020 0000   Diabetic Labs (most recent): Lab Results  Component Value Date   HGBA1C 8.0 (A) 04/10/2024   HGBA1C 8.0 (A) 12/10/2023    HGBA1C 8.2 (A) 08/09/2023   MICROALBUR 0.7 03/31/2018    Lipid Panel     Component Value Date/Time   CHOL 135 12/03/2023 0000   TRIG 115 12/03/2023 0000   HDL 42 12/03/2023 0000   LDLCALC 70 12/03/2023 0000     Assessment & Plan:   1. 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  Ms. Dwain presents with her CGM showing recent improvement in her glycemic profile averaging 159 mg per DL.  Her GMI suggests estimated A1c of 7.1% with glucose variability of 32.0%.  Her AGP shows 61% time in range, 31% level 1 hyperglycemia, 4% level 2 hyperglycemia.  She did have 2% level 1 hypoglycemia and 2% level 2 hypoglycemia.      Her diabetes is complicated by  CKD, obesity , and sedentary life and patient remains at a high risk for more acute and chronic complications of diabetes which include CAD, CVA, CKD, retinopathy, and neuropathy. These are all discussed in detail with the patient.  - I have counseled the patient on diet management and weight loss, by adopting a carbohydrate restricted/protein rich diet.  - she acknowledges that there is a room for improvement in her food and drink choices. - Suggestion is made for her to avoid simple carbohydrates  from her diet including Cakes, Sweet Desserts, Ice Cream, Soda (diet and regular), Sweet Tea, Candies, Chips, Cookies, Store Bought Juices, Alcohol in Excess of  1-2 drinks a day, Artificial Sweeteners,  Coffee Creamer, and Sugar-free Products, Lemonade. This will help patient to have more stable blood glucose profile and potentially avoid unintended weight gain.   - I encouraged the patient to switch to unprocessed or minimally processed complex starch and increased protein intake (animal or plant source), fruits, and vegetables.  - Patient is advised to stick to a routine mealtimes to  eat 3 meals  a day and avoid unnecessary snacks ( to snack only to correct hypoglycemia).   - I have approached patient with the following individualized plan to manage diabetes and patient agrees:   -she will continue to need to have multiple daily injections of insulin  in order for her to achieve control of diabetes to target.    - She is responding to low-dose of Mounjaro .  I discussed and increase her Mounjaro  to 5 mg subcutaneously weekly.  Side effects and precaution discussed with her.    In the interest of avoiding inadvertent hypoglycemia, she is advised to discontinue Humalog  for now.  Advised her to lower her Tresiba  to 70 units nightly.   - She will continue to benefit from Jardiance , 25 mg p.o. daily at breakfast.  Side effects and precautions discussed with her. - She is encouraged to continue to use her CGM at all times.     -Patient is encouraged to call clinic for blood glucose levels less than 70 or above 200 mg /dl.  - Patient specific target  A1c;  LDL, HDL, Triglycerides, were discussed in detail.  2) BP/HTN:  Her blood pressure is controlled to target.  She is advised to continue her current blood pressure medications including valsartan-HCTZ 50-12.5 mg p.o. daily at breakfast.  3) Lipids/HPL: Her recent lipid panel showed controlled LDL at  70.  She is advised to continue simvastatin  40 mg p.o. nightly.  Side effects and precaution discussed with her.    4) subclinical hypothyroidism: She continues to have slightly above target TSH with normal free T4.  She will not need intervention at this time.   She will need repeat thyroid function test after her next visit.    5) obesity: Her BMI is 42.73-- clearly complicating her diabetes care.  She is a candidate for modest weight loss.   Whole food plant-based diet was discussed and recommended to her.  6) Chronic Care/Health Maintenance:  -Patient is on ARB and Statin medications and encouraged to  continue to follow up  with Ophthalmology, Podiatrist at least yearly or according to recommendations, and advised to   stay away from smoking. I have recommended yearly flu vaccine and pneumonia vaccination at least every 5 years; and  sleep for at least 7 hours a day.   Patient cannot exercise optimally.    - I advised patient to maintain close follow up with Elliott, Dianne E for primary care needs.   I spent  41  minutes in the care of the patient today including review of labs from CMP, Lipids, Thyroid Function, Hematology (current and previous including abstractions from other facilities); face-to-face time discussing  her blood glucose readings/logs, discussing hypoglycemia and hyperglycemia episodes and symptoms, medications doses, her options of short and long term treatment based on the latest standards of care / guidelines;  discussion about incorporating lifestyle medicine;  and documenting the encounter. Risk reduction counseling performed per USPSTF guidelines to reduce obesity and cardiovascular risk factors.     Please refer to Patient Instructions for Blood Glucose Monitoring and Insulin /Medications Dosing Guide  in media tab for additional information. Please  also refer to  Patient Self Inventory in the Media  tab for reviewed elements of pertinent patient history.  Brittny Spangle Pitsenbarger participated in the discussions, expressed understanding, and voiced agreement with the above plans.  All questions were answered to her satisfaction. she is encouraged to contact clinic should she have any questions or concerns prior to her return visit.   Follow up plan: - Return in about 4 months (around 08/11/2024) for F/U with Pre-visit Labs, Meter/CGM/Logs, A1c here.  Ranny Earl, MD Phone: [phone]  Fax: [phone]  This note was partially dictated with voice recognition software. Similar sounding words can be transcribed inadequately or may not  be corrected upon review.  04/10/2024, 2:57 PM

## 2024-06-25 ENCOUNTER — Other Ambulatory Visit: Payer: Self-pay | Admitting: "Endocrinology

## 2024-06-28 ENCOUNTER — Telehealth: Payer: Self-pay

## 2024-06-28 NOTE — Telephone Encounter (Signed)
 Received notification pt's Rx for Mounjaro  needs prior authorization.

## 2024-06-29 ENCOUNTER — Telehealth: Payer: Self-pay

## 2024-06-29 ENCOUNTER — Other Ambulatory Visit (HOSPITAL_COMMUNITY): Payer: Self-pay

## 2024-06-29 NOTE — Telephone Encounter (Signed)
 Pharmacy Patient Advocate Encounter   Received notification from Pt Calls Messages that prior authorization for Mounjaro  5mg /0.17ml is required/requested.   Insurance verification completed.   The patient is insured through [facility].   Per test claim: PA required and submitted KEY/EOC/Request #: [id number] due to patient currently has access to the requested medication and a Prior Authorization is not needed for the patient/medication.   Patient is also insured through [employer]. PA attempted through them. Key: [id number] CANCELLED due to This medication may be excluded from the patient's benefit.  I am not sure which is her primary but the only one who will cover is [employer].

## 2024-08-15 ENCOUNTER — Ambulatory Visit: Admitting: "Endocrinology
